# Patient Record
Sex: Male | Born: 1950 | Race: White | Hispanic: No | Marital: Single | Smoking: Current every day smoker
Health system: Southern US, Community
[De-identification: ages and names within clinical notes are randomized; demographics above are authoritative.]

---

## 2019-08-23 DIAGNOSIS — E860 Dehydration: Secondary | ICD-10-CM

## 2019-08-23 DIAGNOSIS — R197 Diarrhea, unspecified: Secondary | ICD-10-CM

## 2019-08-23 DIAGNOSIS — J189 Pneumonia, unspecified organism: Secondary | ICD-10-CM

## 2019-08-23 DIAGNOSIS — R531 Weakness: Secondary | ICD-10-CM

## 2019-08-23 DIAGNOSIS — E871 Hypo-osmolality and hyponatremia: Secondary | ICD-10-CM

## 2019-08-24 DIAGNOSIS — E871 Hypo-osmolality and hyponatremia: Secondary | ICD-10-CM | POA: Diagnosis not present

## 2019-08-24 DIAGNOSIS — E860 Dehydration: Secondary | ICD-10-CM | POA: Diagnosis not present

## 2019-08-24 DIAGNOSIS — I361 Nonrheumatic tricuspid (valve) insufficiency: Secondary | ICD-10-CM

## 2019-08-24 DIAGNOSIS — R531 Weakness: Secondary | ICD-10-CM | POA: Diagnosis not present

## 2019-08-24 DIAGNOSIS — R197 Diarrhea, unspecified: Secondary | ICD-10-CM | POA: Diagnosis not present

## 2019-08-25 DIAGNOSIS — R531 Weakness: Secondary | ICD-10-CM | POA: Diagnosis not present

## 2019-08-25 DIAGNOSIS — R197 Diarrhea, unspecified: Secondary | ICD-10-CM | POA: Diagnosis not present

## 2019-08-25 DIAGNOSIS — E860 Dehydration: Secondary | ICD-10-CM | POA: Diagnosis not present

## 2019-08-25 DIAGNOSIS — E871 Hypo-osmolality and hyponatremia: Secondary | ICD-10-CM | POA: Diagnosis not present

## 2019-08-28 ENCOUNTER — Encounter (HOSPITAL_COMMUNITY): Payer: Self-pay | Admitting: Internal Medicine

## 2019-08-28 ENCOUNTER — Inpatient Hospital Stay (HOSPITAL_COMMUNITY): Payer: Medicare Other

## 2019-08-28 ENCOUNTER — Inpatient Hospital Stay (HOSPITAL_COMMUNITY)
Admission: AD | Admit: 2019-08-28 | Discharge: 2019-09-09 | DRG: 871 | Disposition: A | Discharge status: Skilled Nursing Facility | Payer: Medicare Other | Source: Other Acute Inpatient Hospital | Attending: Internal Medicine | Admitting: Internal Medicine

## 2019-08-28 DIAGNOSIS — A4902 Methicillin resistant Staphylococcus aureus infection, unspecified site: Secondary | ICD-10-CM | POA: Diagnosis present

## 2019-08-28 DIAGNOSIS — I639 Cerebral infarction, unspecified: Secondary | ICD-10-CM | POA: Diagnosis present

## 2019-08-28 DIAGNOSIS — R7881 Bacteremia: Secondary | ICD-10-CM | POA: Diagnosis present

## 2019-08-28 DIAGNOSIS — A4102 Sepsis due to Methicillin resistant Staphylococcus aureus: Secondary | ICD-10-CM | POA: Diagnosis present

## 2019-08-28 DIAGNOSIS — F10239 Alcohol dependence with withdrawal, unspecified: Secondary | ICD-10-CM | POA: Diagnosis present

## 2019-08-28 DIAGNOSIS — G062 Extradural and subdural abscess, unspecified: Secondary | ICD-10-CM

## 2019-08-28 DIAGNOSIS — Z7189 Other specified counseling: Secondary | ICD-10-CM | POA: Diagnosis not present

## 2019-08-28 DIAGNOSIS — G9340 Encephalopathy, unspecified: Secondary | ICD-10-CM

## 2019-08-28 DIAGNOSIS — F1020 Alcohol dependence, uncomplicated: Secondary | ICD-10-CM | POA: Diagnosis present

## 2019-08-28 DIAGNOSIS — F17200 Nicotine dependence, unspecified, uncomplicated: Secondary | ICD-10-CM | POA: Diagnosis present

## 2019-08-28 DIAGNOSIS — E871 Hypo-osmolality and hyponatremia: Secondary | ICD-10-CM | POA: Diagnosis present

## 2019-08-28 DIAGNOSIS — E876 Hypokalemia: Secondary | ICD-10-CM | POA: Diagnosis present

## 2019-08-28 DIAGNOSIS — I2690 Septic pulmonary embolism without acute cor pulmonale: Secondary | ICD-10-CM

## 2019-08-28 DIAGNOSIS — R471 Dysarthria and anarthria: Secondary | ICD-10-CM | POA: Diagnosis not present

## 2019-08-28 DIAGNOSIS — B9562 Methicillin resistant Staphylococcus aureus infection as the cause of diseases classified elsewhere: Secondary | ICD-10-CM | POA: Diagnosis not present

## 2019-08-28 DIAGNOSIS — I10 Essential (primary) hypertension: Secondary | ICD-10-CM | POA: Diagnosis present

## 2019-08-28 DIAGNOSIS — I76 Septic arterial embolism: Secondary | ICD-10-CM | POA: Diagnosis present

## 2019-08-28 DIAGNOSIS — G061 Intraspinal abscess and granuloma: Secondary | ICD-10-CM | POA: Diagnosis not present

## 2019-08-28 DIAGNOSIS — R0600 Dyspnea, unspecified: Secondary | ICD-10-CM | POA: Diagnosis not present

## 2019-08-28 DIAGNOSIS — R6520 Severe sepsis without septic shock: Secondary | ICD-10-CM | POA: Diagnosis present

## 2019-08-28 DIAGNOSIS — R1313 Dysphagia, pharyngeal phase: Secondary | ICD-10-CM | POA: Diagnosis not present

## 2019-08-28 DIAGNOSIS — Z20828 Contact with and (suspected) exposure to other viral communicable diseases: Secondary | ICD-10-CM | POA: Diagnosis present

## 2019-08-28 DIAGNOSIS — D638 Anemia in other chronic diseases classified elsewhere: Secondary | ICD-10-CM | POA: Diagnosis present

## 2019-08-28 DIAGNOSIS — G92 Toxic encephalopathy: Secondary | ICD-10-CM | POA: Diagnosis present

## 2019-08-28 DIAGNOSIS — Y92239 Unspecified place in hospital as the place of occurrence of the external cause: Secondary | ICD-10-CM | POA: Diagnosis present

## 2019-08-28 DIAGNOSIS — D72810 Lymphocytopenia: Secondary | ICD-10-CM | POA: Diagnosis present

## 2019-08-28 DIAGNOSIS — Z8673 Personal history of transient ischemic attack (TIA), and cerebral infarction without residual deficits: Secondary | ICD-10-CM | POA: Diagnosis not present

## 2019-08-28 DIAGNOSIS — Z781 Physical restraint status: Secondary | ICD-10-CM

## 2019-08-28 DIAGNOSIS — F10230 Alcohol dependence with withdrawal, uncomplicated: Secondary | ICD-10-CM | POA: Diagnosis not present

## 2019-08-28 DIAGNOSIS — R0689 Other abnormalities of breathing: Secondary | ICD-10-CM | POA: Diagnosis not present

## 2019-08-28 DIAGNOSIS — I63441 Cerebral infarction due to embolism of right cerebellar artery: Secondary | ICD-10-CM | POA: Diagnosis present

## 2019-08-28 DIAGNOSIS — R0902 Hypoxemia: Secondary | ICD-10-CM

## 2019-08-28 DIAGNOSIS — F1910 Other psychoactive substance abuse, uncomplicated: Secondary | ICD-10-CM

## 2019-08-28 DIAGNOSIS — R29810 Facial weakness: Secondary | ICD-10-CM

## 2019-08-28 DIAGNOSIS — S301XXA Contusion of abdominal wall, initial encounter: Secondary | ICD-10-CM | POA: Diagnosis present

## 2019-08-28 DIAGNOSIS — Z66 Do not resuscitate: Secondary | ICD-10-CM | POA: Diagnosis not present

## 2019-08-28 DIAGNOSIS — F10939 Alcohol use, unspecified with withdrawal, unspecified: Secondary | ICD-10-CM | POA: Diagnosis present

## 2019-08-28 DIAGNOSIS — Z720 Tobacco use: Secondary | ICD-10-CM

## 2019-08-28 DIAGNOSIS — J15212 Pneumonia due to Methicillin resistant Staphylococcus aureus: Secondary | ICD-10-CM | POA: Diagnosis present

## 2019-08-28 DIAGNOSIS — R4182 Altered mental status, unspecified: Secondary | ICD-10-CM | POA: Diagnosis not present

## 2019-08-28 DIAGNOSIS — Z7289 Other problems related to lifestyle: Secondary | ICD-10-CM | POA: Diagnosis not present

## 2019-08-28 DIAGNOSIS — M4622 Osteomyelitis of vertebra, cervical region: Secondary | ICD-10-CM | POA: Diagnosis not present

## 2019-08-28 DIAGNOSIS — J90 Pleural effusion, not elsewhere classified: Secondary | ICD-10-CM | POA: Diagnosis not present

## 2019-08-28 DIAGNOSIS — W19XXXA Unspecified fall, initial encounter: Secondary | ICD-10-CM | POA: Diagnosis present

## 2019-08-28 DIAGNOSIS — R748 Abnormal levels of other serum enzymes: Secondary | ICD-10-CM | POA: Diagnosis present

## 2019-08-28 DIAGNOSIS — Z515 Encounter for palliative care: Secondary | ICD-10-CM

## 2019-08-28 LAB — BASIC METABOLIC PANEL
Anion gap: 9 (ref 5–15)
Anion gap: 11 (ref 5–15)
Anion gap: 11 (ref 5–15)
Anion gap: 10 (ref 5–15)
BUN: 9 mg/dL (ref 8–23)
BUN: 11 mg/dL (ref 8–23)
BUN: 11 mg/dL (ref 8–23)
BUN: 14 mg/dL (ref 8–23)
CO2: 27 mmol/L (ref 22–32)
CO2: 25 mmol/L (ref 22–32)
CO2: 27 mmol/L (ref 22–32)
CO2: 27 mmol/L (ref 22–32)
Calcium: 8.6 mg/dL — ABNORMAL LOW (ref 8.9–10.3)
Calcium: 8.8 mg/dL — ABNORMAL LOW (ref 8.9–10.3)
Calcium: 8.7 mg/dL — ABNORMAL LOW (ref 8.9–10.3)
Calcium: 8.6 mg/dL — ABNORMAL LOW (ref 8.9–10.3)
Chloride: 112 mmol/L — ABNORMAL HIGH (ref 98–111)
Chloride: 113 mmol/L — ABNORMAL HIGH (ref 98–111)
Chloride: 113 mmol/L — ABNORMAL HIGH (ref 98–111)
Chloride: 111 mmol/L (ref 98–111)
Creatinine, Ser: 0.56 mg/dL — ABNORMAL LOW (ref 0.61–1.24)
Creatinine, Ser: 0.72 mg/dL (ref 0.61–1.24)
Creatinine, Ser: 0.7 mg/dL (ref 0.61–1.24)
Creatinine, Ser: 0.83 mg/dL (ref 0.61–1.24)
GFR calc Af Amer: >60 mL/min (ref >60)
GFR calc Af Amer: >60 mL/min (ref >60)
GFR calc Af Amer: >60 mL/min (ref >60)
GFR calc Af Amer: >60 mL/min (ref >60)
GFR calc non Af Amer: >60 mL/min (ref >60)
GFR calc non Af Amer: >60 mL/min (ref >60)
GFR calc non Af Amer: >60 mL/min (ref >60)
GFR calc non Af Amer: >60 mL/min (ref >60)
Glucose, Bld: 136 mg/dL — ABNORMAL HIGH (ref 70–99)
Glucose, Bld: 121 mg/dL — ABNORMAL HIGH (ref 70–99)
Glucose, Bld: 117 mg/dL — ABNORMAL HIGH (ref 70–99)
Glucose, Bld: 121 mg/dL — ABNORMAL HIGH (ref 70–99)
Potassium: 3.4 mmol/L — ABNORMAL LOW (ref 3.5–5.1)
Potassium: 3.6 mmol/L (ref 3.5–5.1)
Potassium: 3.6 mmol/L (ref 3.5–5.1)
Potassium: 3.6 mmol/L (ref 3.5–5.1)
Sodium: 148 mmol/L — ABNORMAL HIGH (ref 135–145)
Sodium: 149 mmol/L — ABNORMAL HIGH (ref 135–145)
Sodium: 151 mmol/L — ABNORMAL HIGH (ref 135–145)
Sodium: 148 mmol/L — ABNORMAL HIGH (ref 135–145)

## 2019-08-28 LAB — TSH: TSH: 0.489 u[IU]/mL (ref 0.350–4.500)

## 2019-08-28 LAB — CREATININE, URINE, RANDOM: Creatinine, Urine: 68.9 mg/dL

## 2019-08-28 LAB — HEMOGLOBIN AND HEMATOCRIT, BLOOD
HCT: 38.8 % — ABNORMAL LOW (ref 39.0–52.0)
HCT: 41.7 % (ref 39.0–52.0)
HCT: 38.9 % — ABNORMAL LOW (ref 39.0–52.0)
Hemoglobin: 13.5 g/dL (ref 13.0–17.0)
Hemoglobin: 13.6 g/dL (ref 13.0–17.0)
Hemoglobin: 13.4 g/dL (ref 13.0–17.0)

## 2019-08-28 LAB — URINALYSIS, ROUTINE W REFLEX MICROSCOPIC
Bilirubin Urine: NEGATIVE
Glucose, UA: NEGATIVE mg/dL
Hgb urine dipstick: NEGATIVE
Ketones, ur: NEGATIVE mg/dL
Leukocytes,Ua: NEGATIVE
Nitrite: NEGATIVE
Protein, ur: NEGATIVE mg/dL
Specific Gravity, Urine: 1.013 (ref 1.005–1.030)
pH: 5 (ref 5.0–8.0)

## 2019-08-28 LAB — CBC WITH DIFFERENTIAL/PLATELET
Abs Immature Granulocytes: 0 10*3/uL (ref 0.00–0.07)
Basophils Absolute: 0 10*3/uL (ref 0.0–0.1)
Basophils Relative: 0 %
Eosinophils Absolute: 0.3 10*3/uL (ref 0.0–0.5)
Eosinophils Relative: 1 %
HCT: 42 % (ref 39.0–52.0)
Hemoglobin: 14.4 g/dL (ref 13.0–17.0)
Lymphocytes Relative: 3 %
Lymphs Abs: 0.9 10*3/uL (ref 0.7–4.0)
MCH: 31.9 pg (ref 26.0–34.0)
MCHC: 34.3 g/dL (ref 30.0–36.0)
MCV: 93.1 fL (ref 80.0–100.0)
Monocytes Absolute: 2 10*3/uL — ABNORMAL HIGH (ref 0.1–1.0)
Monocytes Relative: 7 %
Neutro Abs: 25.9 10*3/uL — ABNORMAL HIGH (ref 1.7–7.7)
Neutrophils Relative %: 89 %
Platelets: 191 10*3/uL (ref 150–400)
RBC: 4.51 MIL/uL (ref 4.22–5.81)
RDW: 13.2 % (ref 11.5–15.5)
WBC: 29.1 10*3/uL — ABNORMAL HIGH (ref 4.0–10.5)
nRBC: 0 % (ref 0.0–0.2)
nRBC: 0 /100 WBC

## 2019-08-28 LAB — HEPATIC FUNCTION PANEL
ALT: 23 U/L (ref 0–44)
AST: 32 U/L (ref 15–41)
Albumin: 2 g/dL — ABNORMAL LOW (ref 3.5–5.0)
Alkaline Phosphatase: 79 U/L (ref 38–126)
Bilirubin, Direct: 0.3 mg/dL — ABNORMAL HIGH (ref 0.0–0.2)
Indirect Bilirubin: 0.9 mg/dL (ref 0.3–0.9)
Total Bilirubin: 1.2 mg/dL (ref 0.3–1.2)
Total Protein: 5.6 g/dL — ABNORMAL LOW (ref 6.5–8.1)

## 2019-08-28 LAB — HIV ANTIBODY (ROUTINE TESTING W REFLEX): HIV Screen 4th Generation wRfx: NONREACTIVE

## 2019-08-28 LAB — LIPID PANEL
Cholesterol: 99 mg/dL (ref 0–200)
HDL: 19 mg/dL — ABNORMAL LOW (ref >40)
LDL Cholesterol: 67 mg/dL (ref 0–99)
Total CHOL/HDL Ratio: 5.2 RATIO
Triglycerides: 66 mg/dL (ref <150)
VLDL: 13 mg/dL (ref 0–40)

## 2019-08-28 LAB — GLUCOSE, CAPILLARY
Glucose-Capillary: 115 mg/dL — ABNORMAL HIGH (ref 70–99)
Glucose-Capillary: 101 mg/dL — ABNORMAL HIGH (ref 70–99)
Glucose-Capillary: 113 mg/dL — ABNORMAL HIGH (ref 70–99)
Glucose-Capillary: 111 mg/dL — ABNORMAL HIGH (ref 70–99)

## 2019-08-28 LAB — CBC
HCT: 45.3 % (ref 39.0–52.0)
Hemoglobin: 15.4 g/dL (ref 13.0–17.0)
MCH: 32.6 pg (ref 26.0–34.0)
MCHC: 34 g/dL (ref 30.0–36.0)
MCV: 96 fL (ref 80.0–100.0)
Platelets: 199 10*3/uL (ref 150–400)
RBC: 4.72 MIL/uL (ref 4.22–5.81)
RDW: 13.4 % (ref 11.5–15.5)
WBC: 31.7 10*3/uL — ABNORMAL HIGH (ref 4.0–10.5)
nRBC: 0 % (ref 0.0–0.2)

## 2019-08-28 LAB — HEMOGLOBIN A1C
Hgb A1c MFr Bld: 5.1 % (ref 4.8–5.6)
Mean Plasma Glucose: 99.67 mg/dL

## 2019-08-28 LAB — TROPONIN I (HIGH SENSITIVITY)
Troponin I (High Sensitivity): 21 ng/L — ABNORMAL HIGH (ref <18)
Troponin I (High Sensitivity): 18 ng/L — ABNORMAL HIGH (ref <18)

## 2019-08-28 LAB — CK: Total CK: 36 U/L — ABNORMAL LOW (ref 49–397)

## 2019-08-28 LAB — SEDIMENTATION RATE: Sed Rate: 19 mm/hr — ABNORMAL HIGH (ref 0–16)

## 2019-08-28 LAB — LACTIC ACID, PLASMA
Lactic Acid, Venous: 2.1 mmol/L (ref 0.5–1.9)
Lactic Acid, Venous: 2.2 mmol/L (ref 0.5–1.9)

## 2019-08-28 LAB — AMMONIA: Ammonia: 45 umol/L — ABNORMAL HIGH (ref 9–35)

## 2019-08-28 LAB — MAGNESIUM: Magnesium: 1.7 mg/dL (ref 1.7–2.4)

## 2019-08-28 LAB — SODIUM, URINE, RANDOM: Sodium, Ur: <10 mmol/L

## 2019-08-28 IMAGING — CT CT CHEST W/ CM
2 of 6 series · 12 of 46 positions shown, 14 images · IV contrast (omnipaque)
Comparison: Chest radiograph dated [DATE].

CLINICAL DATA: 68-year-old male with fever and leukocytosis.

EXAM:
CT CHEST, ABDOMEN, AND PELVIS WITH CONTRAST
TECHNIQUE: Multidetector CT imaging of the chest, abdomen and pelvis was
performed following the standard protocol during bolus
administration of intravenous contrast.
CONTRAST:  100mL OMNIPAQUE IOHEXOL 300 MG/ML  SOLN

[Series 3: abd/ pelvis 5.0 i30f 2 · axial · 0.79mm/px · z∈[+957,+1557]mm · 9 of 138 slices shown, 11 images]
[im 9/138  soft-tissue]
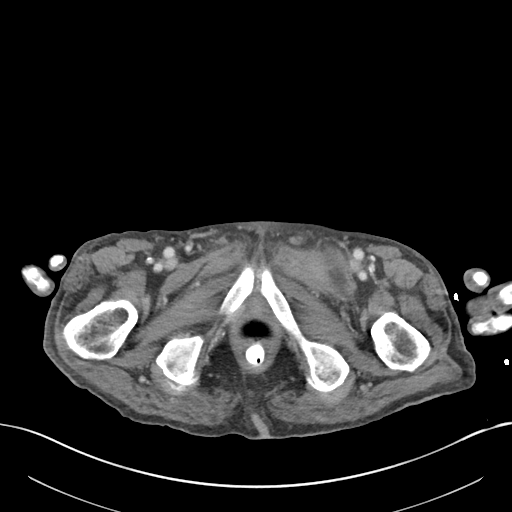
[im 9/138  bone]
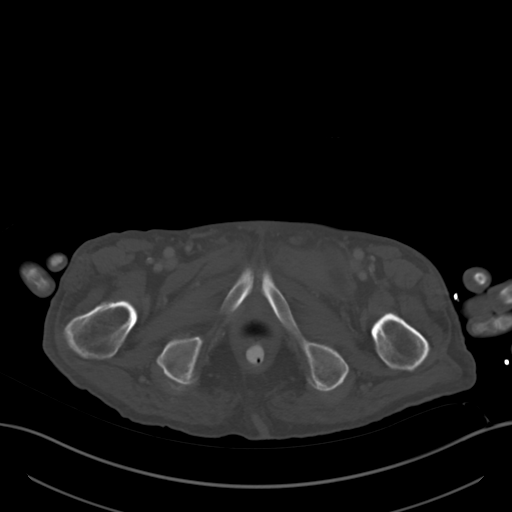
[im 26/138  soft-tissue]
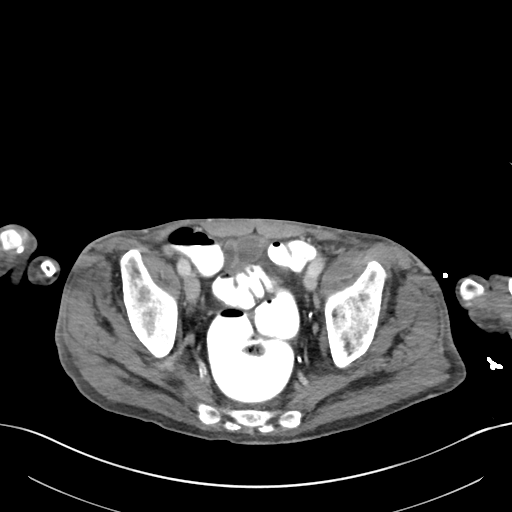
[im 43/138  soft-tissue]
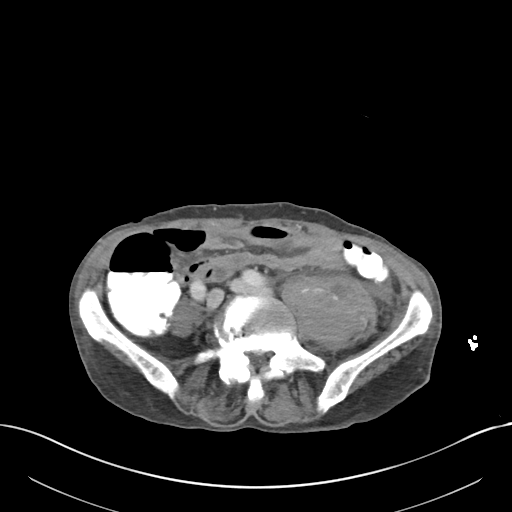
[im 52/138  soft-tissue]
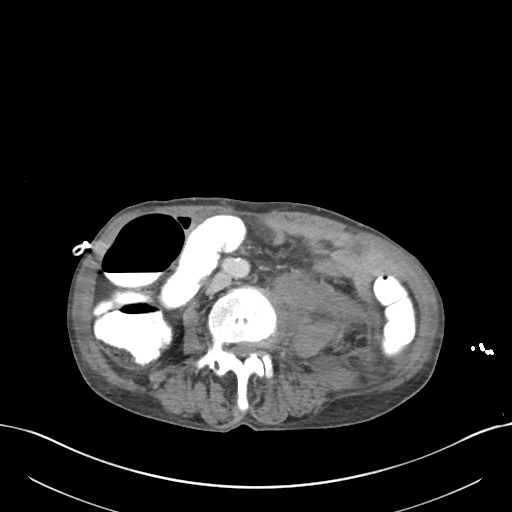
[im 69/138  soft-tissue]
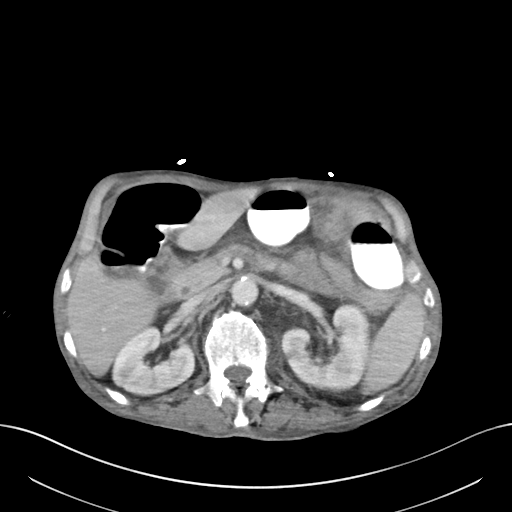
[im 86/138  soft-tissue]
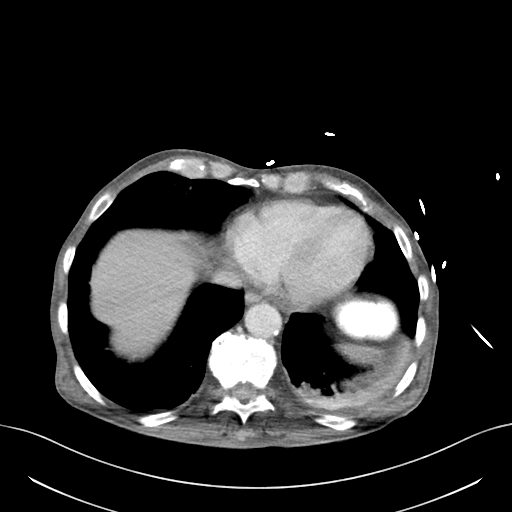
[im 95/138  soft-tissue]
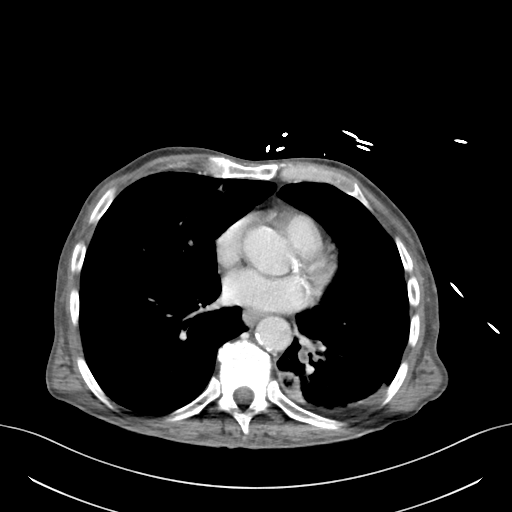
[im 112/138  soft-tissue]
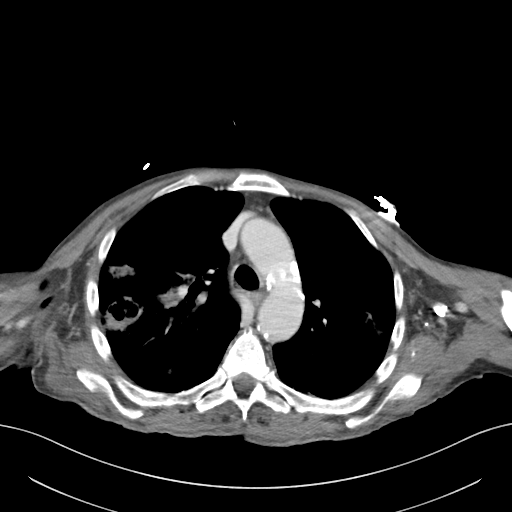
[im 129/138  soft-tissue]
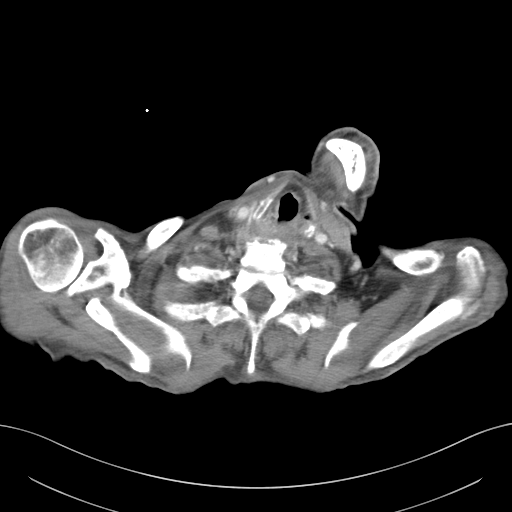
[im 129/138  bone]
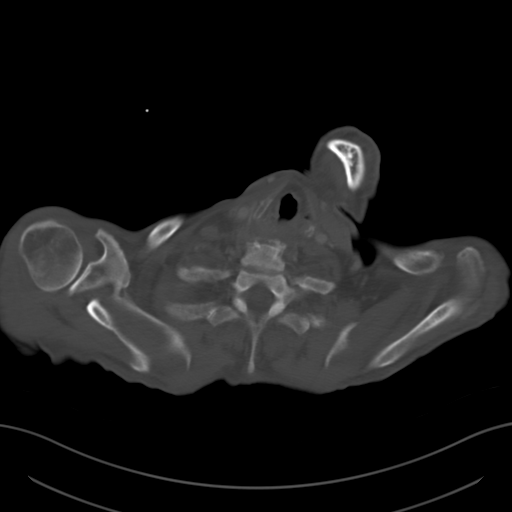

[Series 6: coronal soft tissue · coronal · 0.69mm/px · 3 of 82 slices shown]
[im 28/82  soft-tissue]
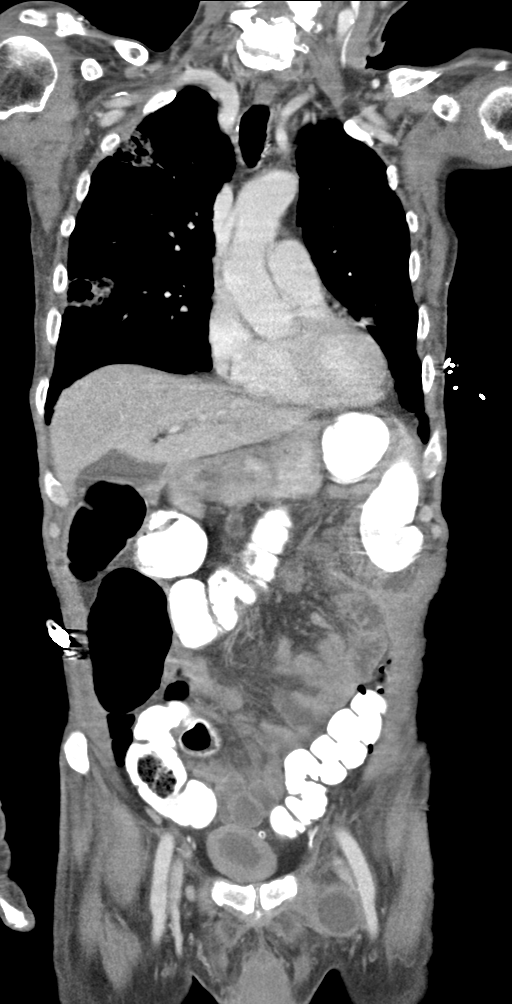
[im 37/82  soft-tissue]
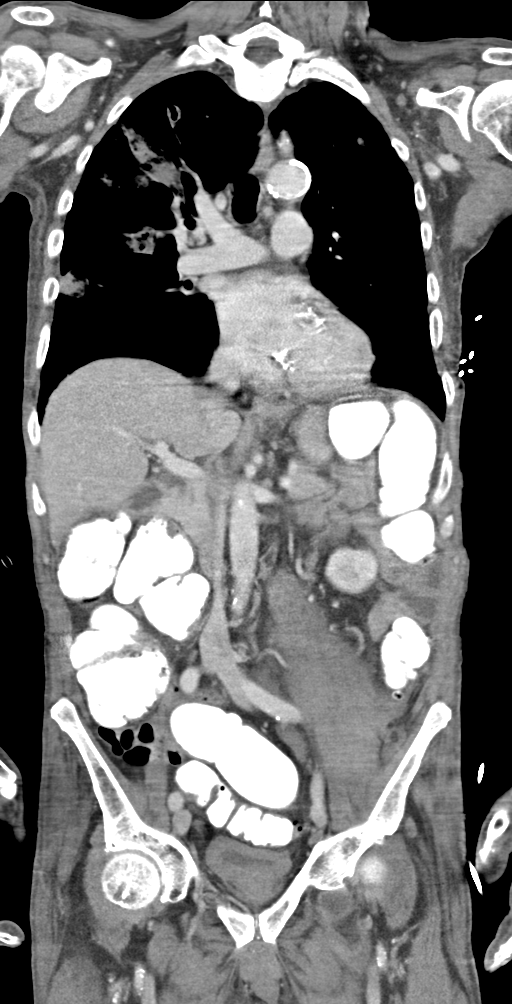
[im 46/82  soft-tissue]
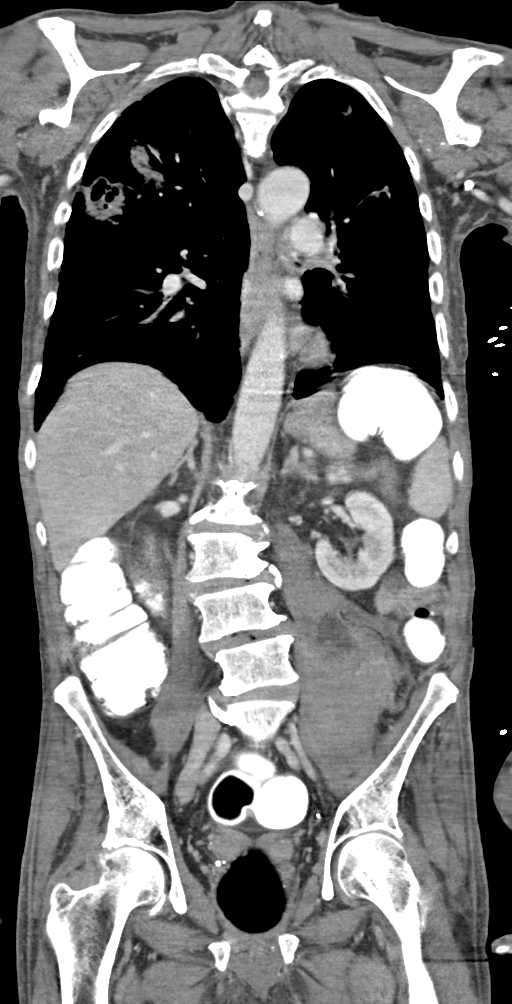

[12 of 46 positions shown; findings below may reference images not displayed]

FINDINGS: CT CHEST FINDINGS

Cardiovascular: There is no cardiomegaly or pericardial effusion.
Calcification of the mitral annulus noted. There is mild
atherosclerotic calcification of the thoracic aorta. No aneurysmal
dilatation or dissection. The central pulmonary arteries are
unremarkable as visualized.

Mediastinum/Nodes: No hilar or mediastinal adenopathy. The esophagus
and the thyroid gland are grossly unremarkable. No mediastinal fluid
collection.

Lungs/Pleura: Trace left pleural effusion. Scattered clusters of
ground-glass and nodular opacities with central cavitation primarily
involving the upper lobes noted. A patchy area of airspace opacity
is noted in the right upper lobe with areas of cavitation. Findings
may represent fungal infection, abscesses, TB, or septic emboli.
Other etiologies are not excluded. Clinical correlation is
recommended. There is no pneumothorax. Mucus content noted in the
distal trachea extending into the left mainstem bronchus. The
central airways however remain patent.

Musculoskeletal: No chest wall mass or suspicious bone lesions
identified.

CT ABDOMEN PELVIS FINDINGS

No intra-abdominal free air or free fluid.

Hepatobiliary: The liver is unremarkable as visualized. No
intrahepatic biliary ductal dilatation. The gallbladder is
unremarkable.

Pancreas: Unremarkable. No pancreatic ductal dilatation or
surrounding inflammatory changes.

Spleen: Normal in size without focal abnormality.

Adrenals/Urinary Tract: The adrenal glands are unremarkable. There
is no hydronephrosis on either side. There is symmetric enhancement
and excretion of contrast by both kidneys. Slightly striated
enhancement pattern of the renal parenchyma may be artifactual.
Correlation with urinalysis recommended to exclude pyelonephritis. A
subcentimeter left renal hypodense lesion is too small to
characterize. The visualized ureters appear unremarkable. The
urinary bladder is predominantly collapsed. There is apparent
diffuse thickening of the bladder wall which may be partly related
to underdistention. Cystitis is not excluded. Correlation with
urinalysis recommended.

Stomach/Bowel: A rectal tube and contrast noted throughout the
colon. There is no bowel obstruction or active inflammation. There
are scattered sigmoid diverticula without active inflammation. The
appendix is normal.

Vascular/Lymphatic: Mild aortoiliac atherosclerotic disease. The IVC
is unremarkable. No portal venous gas. There is no adenopathy.

Reproductive: The prostate and seminal vesicles are grossly
unremarkable.

Other: There is intramuscular hematoma involving the left psoas
muscle extending to the left iliacus muscle. The hematoma in the
left psoas muscle measures approximately 6.5 x 5.3 cm in greatest
axial dimensions and 10 cm in craniocaudal length. There is linear
contrast blushing within the left psoas muscle suggestive of active
bleed.

There is a 3.7 x 3.5 cm low attenuating/cystic structure in the
region of the left inguinal canal which may represent a seroma or an
old hematoma, or possibly a lymphocele. A necrotic mass or lymph
node is favored less likely but not excluded. Correlation with
clinical exam and follow-up recommended. Ultrasound may provide
better evaluation of this structure.

Musculoskeletal: Degenerative changes of the spine. No acute osseous
pathology.
IMPRESSION: 1. Left psoas intramuscular hematoma with evidence of small active
bleed.
2. No bowel obstruction or active inflammation. Normal appendix.
3. Sigmoid diverticulosis.
4. Bilateral pulmonary densities with pattern concerning for fungal
infection, abscesses, TB, or septic emboli. Other etiologies are not
excluded. Clinical correlation is recommended. Trace left pleural
effusion.
5. Slight heterogeneous nephrogram may be artifactual or represent
pyelonephritis. Correlation with urinalysis recommended.
6. Aortic Atherosclerosis ([P1]-[P1]).

These results were called by telephone at the time of interpretation
on [DATE] at [DATE] to provider LORENCI , who verbally
acknowledged these results.

## 2019-08-28 IMAGING — CT CT ABD-PELV W/ CM
2 of 6 series · 12 of 46 positions shown, 14 images · IV contrast (APPLIED)
Comparison: Chest radiograph dated [DATE].

CLINICAL DATA: 68-year-old male with fever and leukocytosis.

EXAM:
CT CHEST, ABDOMEN, AND PELVIS WITH CONTRAST
TECHNIQUE: Multidetector CT imaging of the chest, abdomen and pelvis was
performed following the standard protocol during bolus
administration of intravenous contrast.
CONTRAST:  100mL OMNIPAQUE IOHEXOL 300 MG/ML  SOLN

[Series 3: abd/ pelvis 5.0 i30f 2 · axial · 0.79mm/px · z∈[+957,+1557]mm · 9 of 138 slices shown, 11 images]
[im 9/138  soft-tissue]
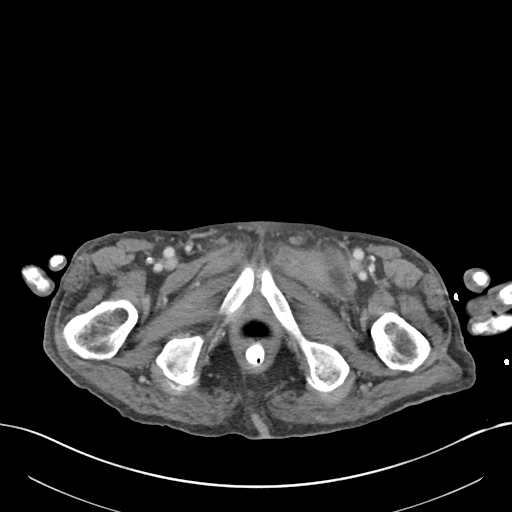
[im 9/138  bone]
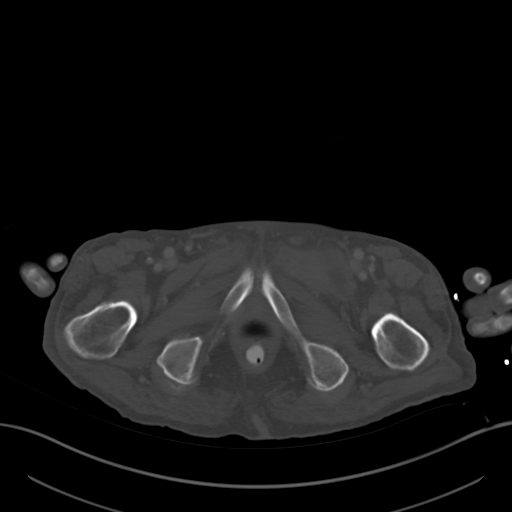
[im 26/138  soft-tissue]
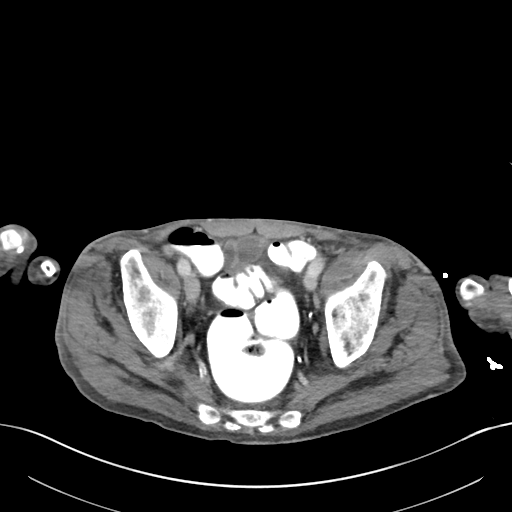
[im 43/138  soft-tissue]
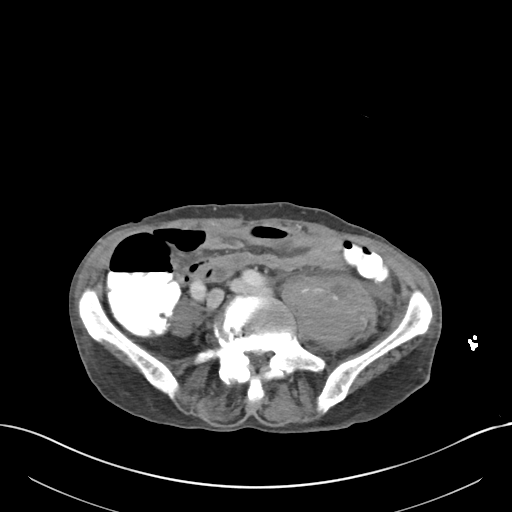
[im 52/138  soft-tissue]
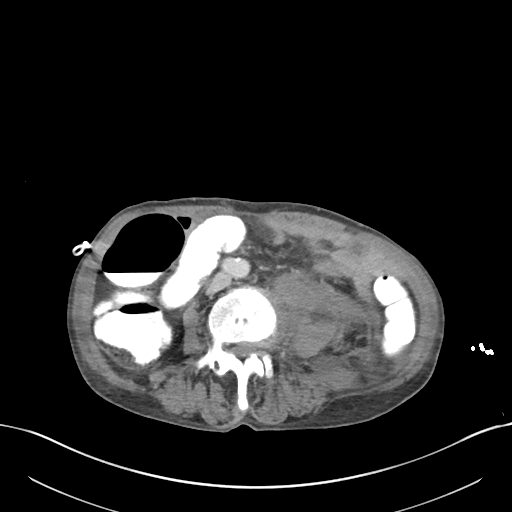
[im 69/138  soft-tissue]
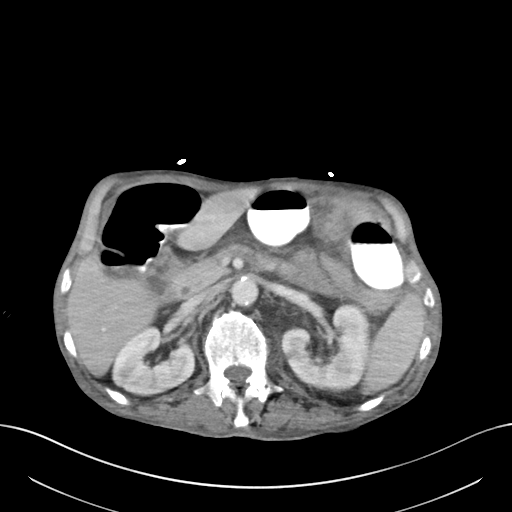
[im 86/138  soft-tissue]
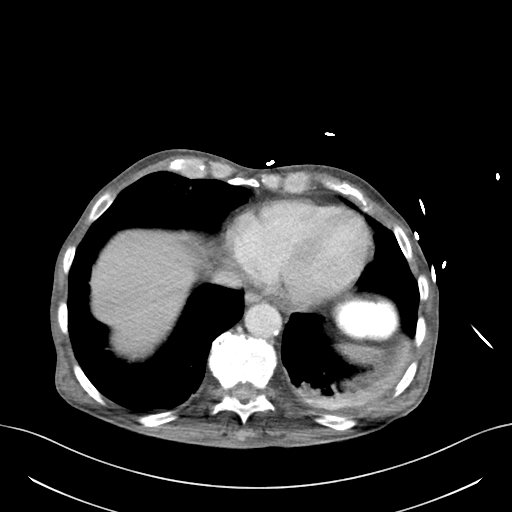
[im 95/138  soft-tissue]
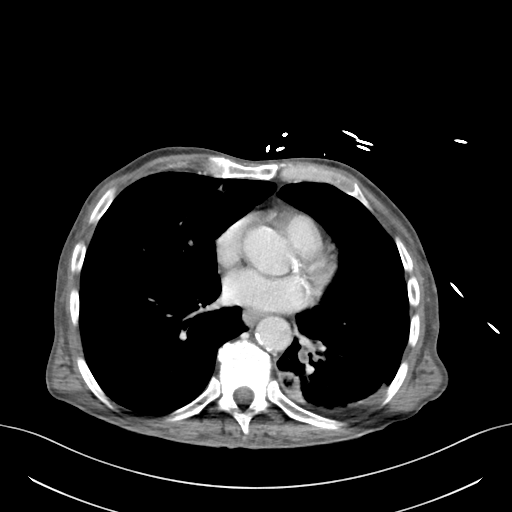
[im 112/138  soft-tissue]
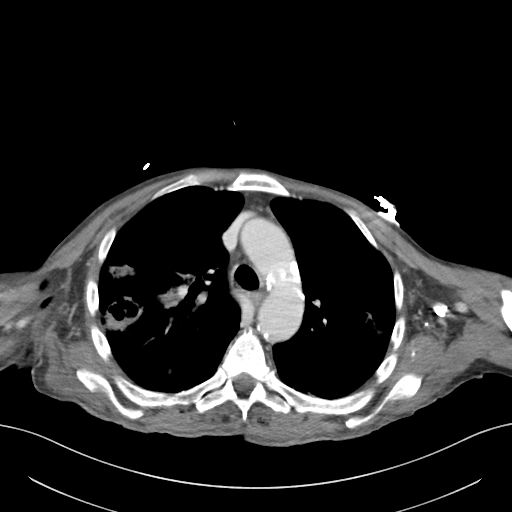
[im 129/138  soft-tissue]
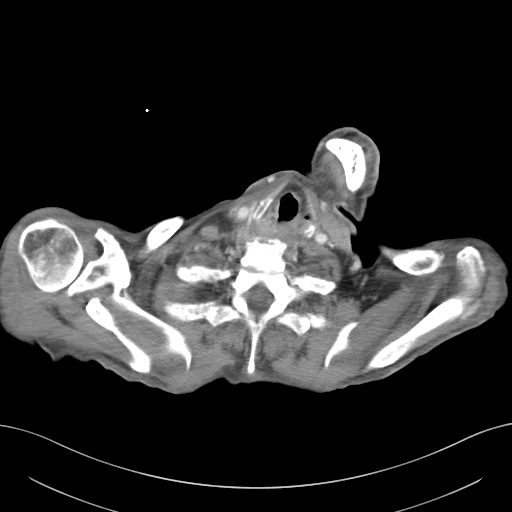
[im 129/138  bone]
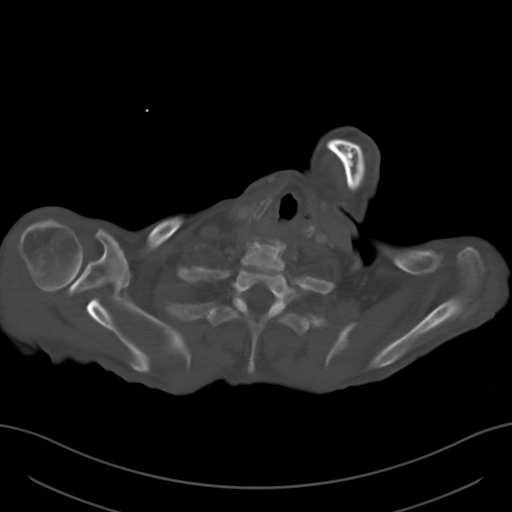

[Series 6: coronal soft tissue · coronal · 0.69mm/px · 3 of 82 slices shown]
[im 28/82  soft-tissue]
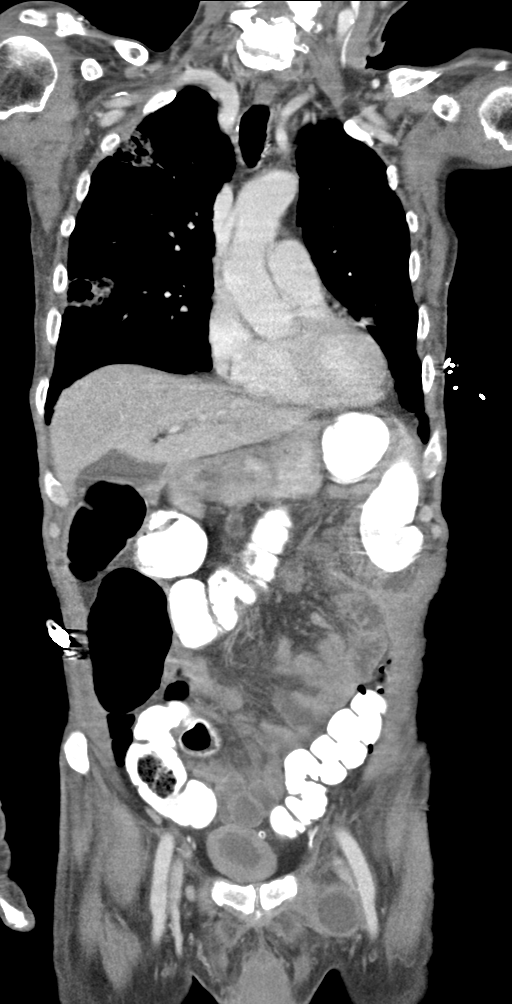
[im 37/82  soft-tissue]
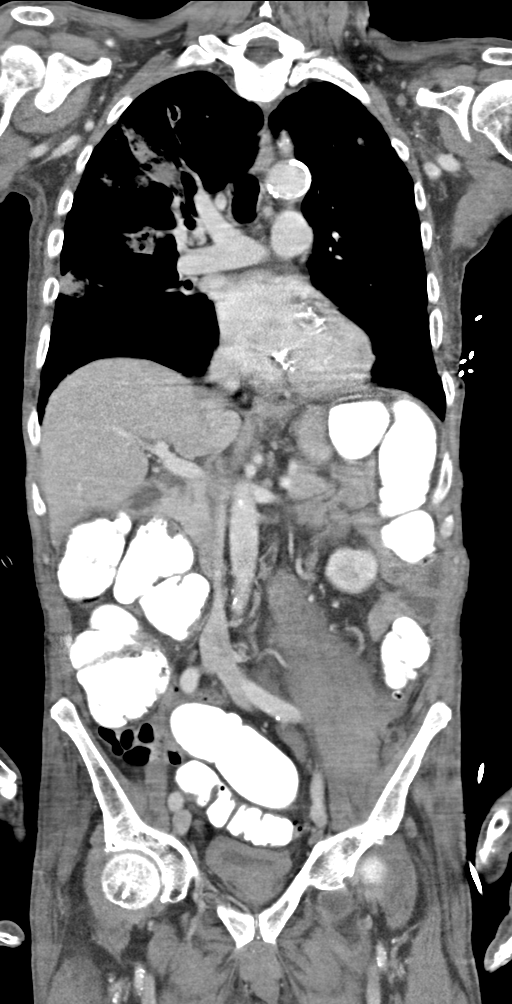
[im 46/82  soft-tissue]
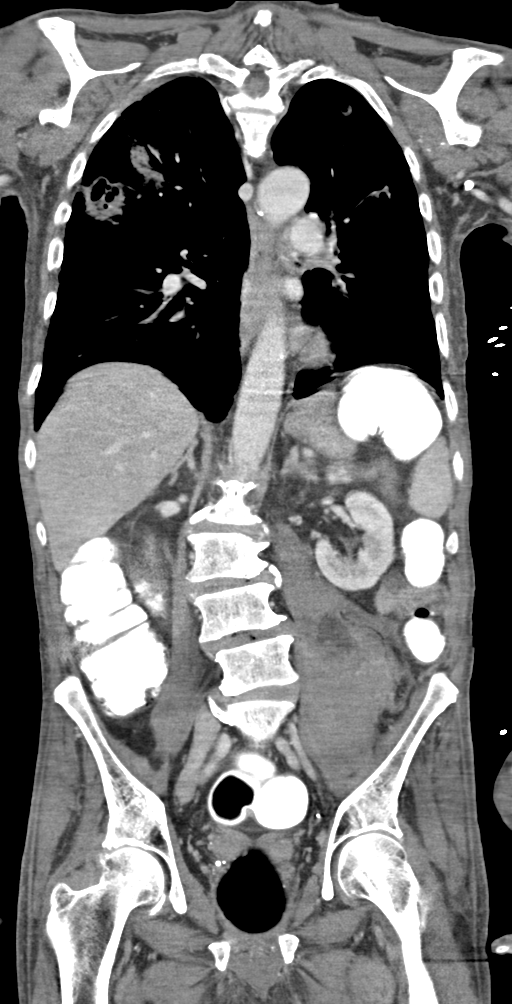

[12 of 46 positions shown; findings below may reference images not displayed]

FINDINGS: CT CHEST FINDINGS

Cardiovascular: There is no cardiomegaly or pericardial effusion.
Calcification of the mitral annulus noted. There is mild
atherosclerotic calcification of the thoracic aorta. No aneurysmal
dilatation or dissection. The central pulmonary arteries are
unremarkable as visualized.

Mediastinum/Nodes: No hilar or mediastinal adenopathy. The esophagus
and the thyroid gland are grossly unremarkable. No mediastinal fluid
collection.

Lungs/Pleura: Trace left pleural effusion. Scattered clusters of
ground-glass and nodular opacities with central cavitation primarily
involving the upper lobes noted. A patchy area of airspace opacity
is noted in the right upper lobe with areas of cavitation. Findings
may represent fungal infection, abscesses, TB, or septic emboli.
Other etiologies are not excluded. Clinical correlation is
recommended. There is no pneumothorax. Mucus content noted in the
distal trachea extending into the left mainstem bronchus. The
central airways however remain patent.

Musculoskeletal: No chest wall mass or suspicious bone lesions
identified.

CT ABDOMEN PELVIS FINDINGS

No intra-abdominal free air or free fluid.

Hepatobiliary: The liver is unremarkable as visualized. No
intrahepatic biliary ductal dilatation. The gallbladder is
unremarkable.

Pancreas: Unremarkable. No pancreatic ductal dilatation or
surrounding inflammatory changes.

Spleen: Normal in size without focal abnormality.

Adrenals/Urinary Tract: The adrenal glands are unremarkable. There
is no hydronephrosis on either side. There is symmetric enhancement
and excretion of contrast by both kidneys. Slightly striated
enhancement pattern of the renal parenchyma may be artifactual.
Correlation with urinalysis recommended to exclude pyelonephritis. A
subcentimeter left renal hypodense lesion is too small to
characterize. The visualized ureters appear unremarkable. The
urinary bladder is predominantly collapsed. There is apparent
diffuse thickening of the bladder wall which may be partly related
to underdistention. Cystitis is not excluded. Correlation with
urinalysis recommended.

Stomach/Bowel: A rectal tube and contrast noted throughout the
colon. There is no bowel obstruction or active inflammation. There
are scattered sigmoid diverticula without active inflammation. The
appendix is normal.

Vascular/Lymphatic: Mild aortoiliac atherosclerotic disease. The IVC
is unremarkable. No portal venous gas. There is no adenopathy.

Reproductive: The prostate and seminal vesicles are grossly
unremarkable.

Other: There is intramuscular hematoma involving the left psoas
muscle extending to the left iliacus muscle. The hematoma in the
left psoas muscle measures approximately 6.5 x 5.3 cm in greatest
axial dimensions and 10 cm in craniocaudal length. There is linear
contrast blushing within the left psoas muscle suggestive of active
bleed.

There is a 3.7 x 3.5 cm low attenuating/cystic structure in the
region of the left inguinal canal which may represent a seroma or an
old hematoma, or possibly a lymphocele. A necrotic mass or lymph
node is favored less likely but not excluded. Correlation with
clinical exam and follow-up recommended. Ultrasound may provide
better evaluation of this structure.

Musculoskeletal: Degenerative changes of the spine. No acute osseous
pathology.
IMPRESSION: 1. Left psoas intramuscular hematoma with evidence of small active
bleed.
2. No bowel obstruction or active inflammation. Normal appendix.
3. Sigmoid diverticulosis.
4. Bilateral pulmonary densities with pattern concerning for fungal
infection, abscesses, TB, or septic emboli. Other etiologies are not
excluded. Clinical correlation is recommended. Trace left pleural
effusion.
5. Slight heterogeneous nephrogram may be artifactual or represent
pyelonephritis. Correlation with urinalysis recommended.
6. Aortic Atherosclerosis ([P1]-[P1]).

These results were called by telephone at the time of interpretation
on [DATE] at [DATE] to provider LORENCI , who verbally
acknowledged these results.

## 2019-08-28 MED ORDER — FOLIC ACID 1 MG PO TABS
1.0000 mg | ORAL_TABLET | Freq: Every day | ORAL | Status: DC
  Filled 2019-08-28: qty 1

## 2019-08-28 MED ORDER — VANCOMYCIN HCL 10 G IV SOLR
1250.0000 mg | Freq: Once | INTRAVENOUS | Status: DC
  Filled 2019-08-28: qty 1250

## 2019-08-28 MED ORDER — STROKE: EARLY STAGES OF RECOVERY BOOK
Freq: Once | Status: AC
  Administered 2019-08-28: 06:00:00
  Filled 2019-08-28: qty 1

## 2019-08-28 MED ORDER — LORAZEPAM 2 MG/ML IJ SOLN
0.0000 mg | Freq: Two times a day (BID) | INTRAMUSCULAR | Status: AC
  Administered 2019-08-30 – 2019-08-31 (×2): 1 mg via INTRAVENOUS
  Filled 2019-08-28 (×2): qty 1

## 2019-08-28 MED ORDER — ACETAMINOPHEN 650 MG RE SUPP
650.0000 mg | Freq: Once | RECTAL | Status: AC
  Administered 2019-08-28: 650 mg via RECTAL

## 2019-08-28 MED ORDER — ACETAMINOPHEN 325 MG PO TABS
650.0000 mg | ORAL_TABLET | ORAL | Status: DC | PRN
  Administered 2019-09-03 – 2019-09-05 (×3): 650 mg via ORAL
  Filled 2019-08-28 (×5): qty 2

## 2019-08-28 MED ORDER — ACETAMINOPHEN 500 MG PO TABS: 1000.0000 mg | ORAL_TABLET | Freq: Once | ORAL | Status: DC

## 2019-08-28 MED ORDER — ASPIRIN 325 MG PO TABS
325.0000 mg | ORAL_TABLET | Freq: Every day | ORAL | Status: DC
  Filled 2019-08-28: qty 1

## 2019-08-28 MED ORDER — ENOXAPARIN SODIUM 40 MG/0.4ML ~~LOC~~ SOLN
40.0000 mg | SUBCUTANEOUS | Status: DC
  Administered 2019-08-28: 40 mg via SUBCUTANEOUS
  Filled 2019-08-28: qty 0.4

## 2019-08-28 MED ORDER — THIAMINE HCL 100 MG/ML IJ SOLN
100.0000 mg | Freq: Every day | INTRAMUSCULAR | Status: DC
  Administered 2019-08-28 – 2019-09-09 (×10): 100 mg via INTRAVENOUS
  Filled 2019-08-28 (×11): qty 2

## 2019-08-28 MED ORDER — IOHEXOL 300 MG/ML  SOLN
100.0000 mL | Freq: Once | INTRAMUSCULAR | Status: AC | PRN
  Administered 2019-08-28: 100 mL via INTRAVENOUS

## 2019-08-28 MED ORDER — ASPIRIN 300 MG RE SUPP
300.0000 mg | Freq: Every day | RECTAL | Status: DC
  Administered 2019-08-28: 300 mg via RECTAL
  Filled 2019-08-28: qty 1

## 2019-08-28 MED ORDER — LORAZEPAM 2 MG/ML IJ SOLN
1.0000 mg | INTRAMUSCULAR | Status: AC | PRN
  Administered 2019-08-31: 2 mg via INTRAVENOUS
  Filled 2019-08-28: qty 1

## 2019-08-28 MED ORDER — VITAMIN B-1 100 MG PO TABS
100.0000 mg | ORAL_TABLET | Freq: Every day | ORAL | Status: DC
  Administered 2019-09-01 – 2019-09-05 (×2): 100 mg via ORAL
  Filled 2019-08-28 (×6): qty 1

## 2019-08-28 MED ORDER — ACETAMINOPHEN 160 MG/5ML PO SOLN
650.0000 mg | ORAL | Status: DC | PRN
  Administered 2019-08-30 – 2019-08-31 (×2): 650 mg
  Filled 2019-08-28 (×2): qty 20.3

## 2019-08-28 MED ORDER — KCL IN DEXTROSE-NACL 10-5-0.45 MEQ/L-%-% IV SOLN
INTRAVENOUS | Status: DC
  Administered 2019-08-28 – 2019-09-03 (×12): via INTRAVENOUS
  Filled 2019-08-28 (×16): qty 1000

## 2019-08-28 MED ORDER — ADULT MULTIVITAMIN W/MINERALS CH
1.0000 | ORAL_TABLET | Freq: Every day | ORAL | Status: DC
  Filled 2019-08-28: qty 1

## 2019-08-28 MED ORDER — VANCOMYCIN HCL IN DEXTROSE 1-5 GM/200ML-% IV SOLN
1000.0000 mg | Freq: Two times a day (BID) | INTRAVENOUS | Status: DC
  Administered 2019-08-28 – 2019-09-05 (×16): 1000 mg via INTRAVENOUS
  Filled 2019-08-28 (×16): qty 200

## 2019-08-28 MED ORDER — ACETAMINOPHEN 650 MG RE SUPP
650.0000 mg | RECTAL | Status: DC | PRN
  Administered 2019-08-28 – 2019-09-02 (×2): 650 mg via RECTAL
  Filled 2019-08-28 (×4): qty 1

## 2019-08-28 MED ORDER — LORAZEPAM 2 MG/ML IJ SOLN
0.0000 mg | Freq: Four times a day (QID) | INTRAMUSCULAR | Status: AC
  Administered 2019-08-28: 1 mg via INTRAVENOUS
  Administered 2019-08-29 (×2): 2 mg via INTRAVENOUS
  Filled 2019-08-28 (×3): qty 1

## 2019-08-28 MED ORDER — LORAZEPAM 1 MG PO TABS: 1.0000 mg | ORAL_TABLET | ORAL | Status: AC | PRN

## 2019-08-28 MED ORDER — SODIUM CHLORIDE 0.9 % IV SOLN
INTRAVENOUS | Status: DC
  Administered 2019-08-28: 03:00:00 via INTRAVENOUS

## 2019-08-28 NOTE — Progress Notes (Signed)
EEG complete - results pending 

## 2019-08-28 NOTE — Progress Notes (Signed)
Pt transferred to and from CT tests. HR remains in 140s, SBP in 150s. O2 is on 4 LNC.  Nurse is aware

## 2019-08-28 NOTE — H&P (Addendum)
History and Physical    Ryan ApleyRichard Lane UJW:[id number] DOB: 1951-04-18 DOA: 08/28/2019  PCP: Patient, No Pcp Per  Patient coming from: [facility].  All the history was obtained from transferring physician through the discharge summary.  Patient is confused.  Chief Complaint: MRSA bacteremia per the discharge summary.  HPI: Ryan Lane is a 68 y.o. male with history of alcohol and tobacco abuse was [facility] on August 23, 2019 that is about 5 days ago with fever chills body aches diarrhea decreased appetite.  In the ER he was found to have leukocytosis and bandemia with lymphopenia.  Patient's lab work showed hyponatremia of 115 lactic acidosis of 3 procalcitonin of 4.95 COVID-19 test was negative chest x-ray showed right middle lobe opacity concerning for pneumonia.  He was started on ceftriaxone and azithromycin for community-acquired pneumonia.  Subsequent which blood cultures grew MRSA.  [facility] and antibiotics were changed to vancomycin and had 2D echo which did not show anything acute and an infectious disease consultant requested patient will need TEE.  The following day patient had a fall following which patient has become more confused and also had some garbled speech.  Plan was to get a CT head and MRI brain but since patient was not lying flat steadily it was initially unable to be done.  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  Patient was found to be mildly weak on the right side with garbled speech.  Getting more confused.  Since patient is also alcoholic was placed on CIWA protocol along with thiamine.  Per records patient's EEG was unremarkable.  Patient was placed on fluids initially for hyponatremia which did not improve with fluids.  Records show that patient was planned to be  started on tolvaptan.  Per the report patient's last sodium was 116 WBC count around 14,000 creatinine was normal.  [facility] management of MRSA bacteremia requiring TEE and stroke.  On my exam patient is completely confused not oriented to name also.  But moves all extremities.  Patient is tachycardic.  Afebrile.  ED Course: Patient is a direct admit.  Review of Systems: As per HPI, rest all negative.   History reviewed. No pertinent past medical history.  History reviewed. No pertinent surgical history.   reports that he has been smoking. He has never used smokeless tobacco. No history on file for alcohol and drug.  Not on File  Family History  Family history unknown: Yes    Prior to Admission medications   Not on File    Physical Exam: Constitutional: Moderately built and nourished. Vitals:   08/28/19 0150 08/28/19 0330  BP: (!) 150/92 (!) 165/90  Pulse: (!) 125 (!) 115  Resp: 20 20  Temp: 99.9 F (37.7 C) 98.6 F (37 C)  TempSrc: Oral Oral  SpO2: 95% 95%  Weight: 61.8 kg    Eyes: Anicteric no pallor. ENMT: No discharge from the ears eyes nose or mouth. Neck: No mass or.  No neck rigidity. Respiratory: No rhonchi or crepitations. Cardiovascular: S1-S2 heard. Abdomen: Soft mild diffuse tenderness bowel sounds present. Musculoskeletal: No edema. Skin: No rash. Neurologic: Patient is alert awake but confused not oriented to his name moving all extremities.  Pupils are equal reactive to light. Psychiatric: Confused.   Labs on Admission: I have  personally reviewed following labs and imaging studies  CBC: Recent Labs  Lab 08/28/19 0245  WBC 29.1*  NEUTROABS 25.9*  HGB 14.4  HCT 42.0  MCV 93.1  PLT 756   Basic Metabolic Panel: Recent Labs  Lab 08/28/19 0245  NA 148*  K 3.4*  CL 112*  CO2 27  GLUCOSE 136*  BUN 9  CREATININE 0.56*  CALCIUM 8.6*  MG 1.7   GFR: CrCl cannot be calculated (Unknown  ideal weight.). Liver Function Tests: Recent Labs  Lab 08/28/19 0245  AST 32  ALT 23  ALKPHOS 79  BILITOT 1.2  PROT 5.6*  ALBUMIN 2.0*   No results for input(s): LIPASE, AMYLASE in the last 168 hours. No results for input(s): AMMONIA in the last 168 hours. Coagulation Profile: No results for input(s): INR, PROTIME in the last 168 hours. Cardiac Enzymes: No results for input(s): CKTOTAL, CKMB, CKMBINDEX, TROPONINI in the last 168 hours. BNP (last 3 results) No results for input(s): PROBNP in the last 8760 hours. HbA1C: Recent Labs    08/28/19 0245  HGBA1C 5.1   CBG: Recent Labs  Lab 08/28/19 0321  GLUCAP 115*   Lipid Profile: Recent Labs    08/28/19 0245  CHOL 99  HDL 19*  LDLCALC 67  TRIG 66  CHOLHDL 5.2   Thyroid Function Tests: No results for input(s): TSH, T4TOTAL, FREET4, T3FREE, THYROIDAB in the last 72 hours. Anemia Panel: No results for input(s): VITAMINB12, FOLATE, FERRITIN, TIBC, IRON, RETICCTPCT in the last 72 hours. Urine analysis: No results found for: COLORURINE, APPEARANCEUR, LABSPEC, PHURINE, GLUCOSEU, HGBUR, BILIRUBINUR, KETONESUR, PROTEINUR, UROBILINOGEN, NITRITE, LEUKOCYTESUR Sepsis Labs: @LABRCNTIP (procalcitonin:4,lacticidven:4) )No results found for this or any previous visit (from the past 240 hour(s)).   Radiological Exams on Admission: No results found.    Assessment/Plan Principal Problem:   MRSA bacteremia Active Problems:   Alcohol withdrawal ([place])   Acute CVA (cerebrovascular accident) ([place])   Hyponatremia    1. MRSA bacteremia source could be from pneumonia for which patient is on vancomycin at this time.  Will need to get cardiology involved for TEE.  Consult infectious disease in the morning. 2. Acute CVA -patient is presently confused and failed swallow.  Will get speech therapy involved.  Aspirin for now.  Discussed with Dr. Leonel Ramsay will be seeing patient in consult. 3. Acute encephalopathy likely multifactorial  including sepsis possible alcohol withdrawal acute CVA and electrolyte abnormalities.  Per report EEG was negative for any seizures.  Check ammonia levels.  Closely monitor.  Will also await neurology input. 4. Alcohol withdrawal on CIWA.   5. Hyponatremia which did not respond to fluids.  Urine sodium as per the report was around 11.  Per discharge summary patient was to be started on tolvaptan.  Not sure if he had received it.  The sodium done over ER shows 148.  And will repeat a stat to make sure it is not read.  Per report patient's cortisol levels were high will recheck TSH.  Check urine studies.  Given that patient has acute CVA with MRSA bacteremia acute encephalopathy patient is critically ill and will need inpatient status.  EKG repeat metabolic panel lactic acid TSH cardiac markers CK levels and urine studies are pending.  On my exam patient had mild tenderness of the abdomen and since patient is still tachycardic with leukocytosis I have ordered CT of the chest and abdomen without contrast stat.   DVT prophylaxis: Lovenox. Code Status: Full code. Family Communication: We will need to discuss  with family. Disposition Plan: To be determined. Consults called: Neurology. Admission status: Inpatient.   Eduard Clos MD [facility] Pager [phone].  If 7PM-7AM, please contact night-coverage [email or web address] Password [email or web address]  08/28/2019, 4:49 AM

## 2019-08-28 NOTE — Consult Note (Signed)
[facility]    Date of Admission:  08/28/2019     Total days of antibiotics                Reason for Consult: MRSA Bacteremia    Referring Provider: [facility] Primary Care Provider: Patient, No Pcp Per   ASSESSMENT:  Mr. 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 Blood cultures have shown persistent bacteremia and will obtain new cultures today. There is concern for alcohol withdrawal although per OSH notes he has not drank alcohol recently. Will continue current dose of vancomycin and recommend TEE to be completed. Primary team discussing options with family. At minimum he will need at least 6 weeks of IV antibiotic therapy.    PLAN:  1. Continue vancomycin 2. Monitor renal function for nephrotoxicity 3. Recheck blood cultures. 4. TEE recommended when able depending on plan of care.    Principal Problem:   MRSA bacteremia Active Problems:   Alcohol withdrawal (H[facility])   Acute CVA (cerebrovascular accident) (H[facility])   Hyponatremia   . folic acid  1 mg Oral Daily  . LORazepam  0-4 mg Intravenous Q6H   Followed by  . [START ON 08/30/2019] LORazepam  0-4 mg Intravenous Q12H  . multivitamin with minerals  1 tablet Oral Daily  . thiamine  100 mg Oral Daily   Or  . thiamine  100 mg Intravenous Daily     HPI: Eladio Dentremont is a 67 y.o. male with previous medical history of alcohol and tobacco use who was initially seen at [facility] emergency department with a chief complaint of body aches, fatigue, diarrhea, and decreased appetite for 2-day duration.  He was having loose watery diarrhea for 2 days and low-grade fever.  In the ED he was febrile with a temperature of 101.3 and tachycardic. Blood work showed hyponatremia with sodium level of 115 and WBC count of 12.5. Lactic acid elevated at 3.0. hest x-ray  obtained with hazy consolidative opacities in the right midlung which may represent pneumonia in the appropriate clinical setting.SARS-CoV-2 testing negative. Blood cultures obtained and started on ceftriaxone and azithromycin with concern for pneumonia.   Blood cultures turned positive for MRSA. ID consulted with recommendation change to vancomycin. Repeat blood cultures on 08/24/19 and 09/05/19 were positive.   On 11/8 he experienced a fall moving between the chair and the bed with no neurological conditions. He was noted to have garbled speech and weakness on the right side over the course of the following day. STAT MRI and and CT was attempted but unsuccessful. Teleneurology evaluated with AMS and encephalopathy and stroke findings were not affecting his current status. EEG was negative.   TTE was negative for vegetations with recommendations for TEE. No probe was available at [facility] so he was transferred to [facility].    Mr. Brooks has been febrile since admission with a max temperature of 101.1 and significant leukocytosis of 31.7. Neurology evaluation with suspicion for persistent septic encephalopathy vs meningitis and concern for embolic disease. CT of the abdomen and pelvis/chest with left psoas intramuscular hematoma with small active bleed; ground glass nodular opacities with central cavitation  concerning for fungal infection, abscesses or septic emboli.   Mr. Haddon continues to have altered mental status and does not able to significantly contribute to this information which has been obtained primarily from OSH  records and general chart review. Per primary notes Mr. Liew has history of methamphetamine, THC, and other illicit drugs. HIV non-reactive.   Review of Systems: Review of Systems  Unable to perform ROS: Acuity of condition     History reviewed. No pertinent past medical history.  Social History   Tobacco Use  . Smoking status: Current Every Day Smoker  .  Smokeless tobacco: Never Used  Substance Use Topics  . Alcohol use: Not on file  . Drug use: Not on file    Family History  Family history unknown: Yes    Not on File  OBJECTIVE: Blood pressure 136/74, pulse (!) 135, temperature 99.1 F (37.3 C), resp. rate (!) 25, weight 61.8 kg, SpO2 98 %.  Physical Exam Constitutional:      General: He is not in acute distress.    Appearance: He is well-developed. He is ill-appearing.     Comments: Lying in bed  Cardiovascular:     Rate and Rhythm: Normal rate and regular rhythm.     Heart sounds: Normal heart sounds. No murmur.  Pulmonary:     Effort: Pulmonary effort is normal.     Breath sounds: Normal breath sounds.  Skin:    General: Skin is warm and dry.  Neurological:     Mental Status: He is alert.     Comments: Apparent facial droop present; able to move all 4 extremities and follows commands. Speech is garbled at best he appears to be oriented to person.     Lab Results Lab Results  Component Value Date   WBC 31.7 (H) 08/28/2019   HGB 13.5 08/28/2019   HCT 38.8 (L) 08/28/2019   MCV 96.0 08/28/2019   PLT 199 08/28/2019    Lab Results  Component Value Date   CREATININE 0.83 08/28/2019   BUN 14 08/28/2019   NA 148 (H) 08/28/2019   K 3.6 08/28/2019   CL 111 08/28/2019   CO2 27 08/28/2019    Lab Results  Component Value Date   ALT 23 08/28/2019   AST 32 08/28/2019   ALKPHOS 79 08/28/2019   BILITOT 1.2 08/28/2019     Microbiology: No results found for this or any previous visit (from the past 240 hour(s)).   Terri Piedra, NP [facility] [phone] Pager  08/28/2019  4:05 PM

## 2019-08-28 NOTE — Progress Notes (Signed)
PT Cancellation Note  Patient Details Name: Ryan Lane MRN: [id number] DOB: [birth year]   Cancelled Treatment:    Reason Eval/Treat Not Completed: Patient not medically ready Per RN, [place], pt not appropriate for therapy evaluation due to lethargy and fever.  Ellamae Sia, PT, DPT Acute Rehabilitation Services Pager [phone] Office [phone]    Willy Eddy 08/28/2019, 12:01 PM

## 2019-08-28 NOTE — Progress Notes (Addendum)
Pharmacy Antibiotic Note  Ryan Lane is a 68 y.o. male admitted on 08/28/2019 with bacteremia.  Pharmacy has been consulted for Vancomycin dosing.  CrCL approximately 75 ml/min.  Vancomycin 1000 mg IV Q 12 hrs. Goal AUC 400-550. Expected AUC: 531 SCr used: 0.7   Plan: Vanc 1000 mg IV q12hr Monitor renal function, clinical status, C&S and vanc levels  Weight: 136 lb 3.9 oz (61.8 kg)  Temp (24hrs), Avg:99.3 F (37.4 C), Min:98.6 F (37 C), Max:99.9 F (37.7 C)  Recent Labs  Lab 08/28/19 0245  WBC 29.1*  CREATININE 0.56*    CrCl cannot be calculated (Unknown ideal weight.).    Not on File  Antimicrobials this admission: Vanc 11/12 >>  Thank you for allowing pharmacy to be a part of this patient's care.  Alanda Slim, PharmD, [facility] Clinical Pharmacist Please see AMION for all Pharmacists' Contact Phone Numbers 08/28/2019, 4:06 AM

## 2019-08-28 NOTE — Progress Notes (Signed)
Lab called RN with critical lab value for lactic acid of 2.1. Dr. Hal Hope notified; no new orders received. Will continue to closely monitor pt. Delia Heady RN

## 2019-08-28 NOTE — Progress Notes (Signed)
Pt has gone for other testing will try back later.Marland KitchenMarland Kitchen

## 2019-08-28 NOTE — Progress Notes (Signed)
Patient Demographics:    Ryan Lane, is a 68 y.o. male, DOB - Aug 17, 1951, YJE:[id number]  Admit date - 08/28/2019   Admitting Physician Darliss Cheney, MD  Outpatient Primary MD for the patient is Patient, No Pcp Per  LOS - 0   No chief complaint on file.       Subjective:    Ryan Lane today has  no emesis,  No chest pain, persistent fevers -Altered mentation  Assessment  & Plan :    Principal Problem:   MRSA bacteremia Active Problems:   Alcohol withdrawal ([place])   Acute CVA (cerebrovascular accident) ([place])   Hyponatremia  Brief Summary:- 68 y.o. male with a history of alcohol abuse who was brought to [facility] on November 7 and found to have pneumonia/bacteremia with MRSA, transferred to [facility] on 08/28/2019 for further treatment and possible TEE EEG and TTE at [facility] were largely unremarkable   CT Chest/CT Abd/Pelvis 1. Left psoas intramuscular hematoma with evidence of small active bleed. 2. No bowel obstruction or active inflammation. Normal appendix. 3. Sigmoid diverticulosis. 4. Bilateral pulmonary densities with pattern concerning for fungal infection, abscesses, TB, or septic emboli. Other etiologies are not excluded. Clinical correlation is recommended. Trace left pleural effusion. 5. Slight heterogeneous nephrogram may be artifactual or represent pyelonephritis. Correlation with urinalysis recommended.   A/p 1) Left psoas intramuscular hematoma with evidence of small active bleed--- stop subcu Lovenox, stop aspirin, -H&H every 4 hours -Hemoglobin currently down to 13.5 from 15.4 -Discussed with radiologist no specific intervention required at this time  2) acute toxic/metabolic encephalopathy--- in the setting of MRSA bacteremia and pneumonia--- treat underlying infection with IV vancomycin,  -Repeat EEG pending -Continue thiamine replacement, low  index of suspicion for Warnicke's encephalopathy (history of longstanding alcohol abuse) -Ammonia is 45, and TSH is 0.489  3)Cerebellar CVA--MRI from [facility] with acute stroke (punctate cerebellar infarct/ right cerebellar and right corona radiata )----- may be embolic, however unable to give aspirin due to #1 above -Consider repeat MRI if neuro status does not improve with treatment of underlying infection  4)Sepsis secondary to MRSA Bacteremia and pneumonia--- WBC 31.7, persistent fevers (T-max 101.2), tachycardia with heart rate in the 130s to 140s and tachypnea with respiratory rate in the 20s to 30s, and lactic acid of 2.2--continue IV vancomycin, repeat blood cultures ordered  5)Polysubstance Abuse--- as per patient's nephew Mr. Rush Landmark Essick--patient has history of methamphetamine, THC alcohol and other illicit drug use  6)Social/Ethics--patient is estranged from his 2 sons over the years due to polysubstance abuse, patient sister Izora Gala and patient's nephew Karlene Einstein his primary contact - I called and d/w nephew Karlene Einstein [phone] -Get palliative care consult  Disposition/Need for in-Hospital Stay- patient unable to be discharged at this time due to --- sepsis secondary to MRSA bacteremia and pneumonia requiring IV antibiotics as well as acute stroke requiring further monitoring  Code Status : Full code  Family Communication:     d/w nephew Karlene Einstein -[phone]  Disposition Plan  : TBD  Consults  :  Neurology/ID/Radiology  DVT Prophylaxis  :  - SCDs (active psoas of bleeding)  Lab Results  Component Value Date   PLT 199 08/28/2019    Inpatient Medications  Scheduled  Meds:  folic acid  1 mg Oral Daily   LORazepam  0-4 mg Intravenous Q6H   Followed by   Melene Muller ON 08/30/2019] LORazepam  0-4 mg Intravenous Q12H   multivitamin with minerals  1 tablet Oral Daily   thiamine  100 mg Oral Daily   Or   thiamine  100 mg Intravenous Daily   Continuous  Infusions:  vancomycin     PRN Meds:.acetaminophen **OR** acetaminophen (TYLENOL) oral liquid 160 mg/5 mL **OR** acetaminophen, LORazepam **OR** LORazepam    Anti-infectives (From admission, onward)   Start     Dose/Rate Route Frequency Ordered Stop   08/28/19 1600  vancomycin (VANCOCIN) IVPB 1000 mg/200 mL premix     1,000 mg 200 mL/hr over 60 Minutes Intravenous Every 12 hours 08/28/19 0409     08/28/19 0315  vancomycin (VANCOCIN) 1,250 mg in sodium chloride 0.9 % 250 mL IVPB  Status:  Discontinued     1,250 mg 166.7 mL/hr over 90 Minutes Intravenous  Once 08/28/19 0307 08/28/19 0549        Objective:   Vitals:   08/28/19 1030 08/28/19 1153 08/28/19 1200 08/28/19 1400  BP:   (!) 153/91 136/74  Pulse: (!) 140  (!) 147 (!) 135  Resp: (!) 38  (!) 25 (!) 25  Temp: (!) 100.6 F (38.1 C) (!) 101.1 F (38.4 C)  99.1 F (37.3 C)  TempSrc: Axillary Axillary    SpO2: 93%  92% 98%  Weight:        Wt Readings from Last 3 Encounters:  08/28/19 61.8 kg     Intake/Output Summary (Last 24 hours) at 08/28/2019 1516 Last data filed at 08/28/2019 1229 Gross per 24 hour  Intake --  Output 600 ml  Net -600 ml   Physical Exam  Gen:-Very lethargic HEENT:- [place].AT, No sclera icterus Neck-Supple Neck,No JVD,.  Lungs-diminished breath sounds with scattered rhonchi CV- S1, S2 normal, tachycardic with heart rate in the 130s  abd-  +ve B.Sounds, Abd Soft, No tenderness,    Extremity/Skin:- No  edema, pedal pulses present  Psych-inconsistently responsive, very lethargic Neuro-generalized weakness, some left-sided deficits, neuro exam is limited due to severe lethargy and inability to consistently follow commands     Data Review:   Micro Results No results found for this or any previous visit (from the past 240 hour(s)).  Radiology Reports Ct Chest W Contrast  Result Date: 08/28/2019 CLINICAL DATA:  68 year old male with fever and leukocytosis. EXAM: CT CHEST, ABDOMEN, AND PELVIS  WITH CONTRAST TECHNIQUE: Multidetector CT imaging of the chest, abdomen and pelvis was performed following the standard protocol during bolus administration of intravenous contrast. CONTRAST:  OMNIPAQUE IOHEXOL 300 MG/ML  SOLN COMPARISON:  Chest radiograph dated 08/22/2019. FINDINGS: CT CHEST FINDINGS Cardiovascular: There is no cardiomegaly or pericardial effusion. Calcification of the mitral annulus noted. There is mild atherosclerotic calcification of the thoracic aorta. No aneurysmal dilatation or dissection. The central pulmonary arteries are unremarkable as visualized. Mediastinum/Nodes: No hilar or mediastinal adenopathy. The esophagus and the thyroid gland are grossly unremarkable. No mediastinal fluid collection. Lungs/Pleura: Trace left pleural effusion. Scattered clusters of ground-glass and nodular opacities with central cavitation primarily involving the upper lobes noted. A patchy area of airspace opacity is noted in the right upper lobe with areas of cavitation. Findings may represent fungal infection, abscesses, TB, or septic emboli. Other etiologies are not excluded. Clinical correlation is recommended. There is no pneumothorax. Mucus content noted in the distal trachea extending into the left  mainstem bronchus. The central airways however remain patent. Musculoskeletal: No chest wall mass or suspicious bone lesions identified. CT ABDOMEN PELVIS FINDINGS No intra-abdominal free air or free fluid. Hepatobiliary: The liver is unremarkable as visualized. No intrahepatic biliary ductal dilatation. The gallbladder is unremarkable. Pancreas: Unremarkable. No pancreatic ductal dilatation or surrounding inflammatory changes. Spleen: Normal in size without focal abnormality. Adrenals/Urinary Tract: The adrenal glands are unremarkable. There is no hydronephrosis on either side. There is symmetric enhancement and excretion of contrast by both kidneys. Slightly striated enhancement pattern of the renal  parenchyma may be artifactual. Correlation with urinalysis recommended to exclude pyelonephritis. A subcentimeter left renal hypodense lesion is too small to characterize. The visualized ureters appear unremarkable. The urinary bladder is predominantly collapsed. There is apparent diffuse thickening of the bladder wall which may be partly related to underdistention. Cystitis is not excluded. Correlation with urinalysis recommended. Stomach/Bowel: A rectal tube and contrast noted throughout the colon. There is no bowel obstruction or active inflammation. There are scattered sigmoid diverticula without active inflammation. The appendix is normal. Vascular/Lymphatic: Mild aortoiliac atherosclerotic disease. The IVC is unremarkable. No portal venous gas. There is no adenopathy. Reproductive: The prostate and seminal vesicles are grossly unremarkable. Other: There is intramuscular hematoma involving the left psoas muscle extending to the left iliacus muscle. The hematoma in the left psoas muscle measures approximately 6.5 x 5.3 cm in greatest axial dimensions and 10 cm in craniocaudal length. There is linear contrast blushing within the left psoas muscle suggestive of active bleed. There is a 3.7 x 3.5 cm low attenuating/cystic structure in the region of the left inguinal canal which may represent a seroma or an old hematoma, or possibly a lymphocele. A necrotic mass or lymph node is favored less likely but not excluded. Correlation with clinical exam and follow-up recommended. Ultrasound may provide better evaluation of this structure. Musculoskeletal: Degenerative changes of the spine. No acute osseous pathology. IMPRESSION: 1. Left psoas intramuscular hematoma with evidence of small active bleed. 2. No bowel obstruction or active inflammation. Normal appendix. 3. Sigmoid diverticulosis. 4. Bilateral pulmonary densities with pattern concerning for fungal infection, abscesses, TB, or septic emboli. Other etiologies are  not excluded. Clinical correlation is recommended. Trace left pleural effusion. 5. Slight heterogeneous nephrogram may be artifactual or represent pyelonephritis. Correlation with urinalysis recommended. 6. Aortic Atherosclerosis (ICD10-I70.0). These results were called by telephone at the time of interpretation on 08/28/2019 at 1:45 pm to provider [facility] , who verbally acknowledged these results. Electronically Signed   By: Elgie CollardArash  Radparvar M.D.   On: 08/28/2019 13:51   Ct Abdomen Pelvis W Contrast  Result Date: 08/28/2019 CLINICAL DATA:  68 year old male with fever and leukocytosis. EXAM: CT CHEST, ABDOMEN, AND PELVIS WITH CONTRAST TECHNIQUE: Multidetector CT imaging of the chest, abdomen and pelvis was performed following the standard protocol during bolus administration of intravenous contrast. CONTRAST:  100mL OMNIPAQUE IOHEXOL 300 MG/ML  SOLN COMPARISON:  Chest radiograph dated 08/22/2019. FINDINGS: CT CHEST FINDINGS Cardiovascular: There is no cardiomegaly or pericardial effusion. Calcification of the mitral annulus noted. There is mild atherosclerotic calcification of the thoracic aorta. No aneurysmal dilatation or dissection. The central pulmonary arteries are unremarkable as visualized. Mediastinum/Nodes: No hilar or mediastinal adenopathy. The esophagus and the thyroid gland are grossly unremarkable. No mediastinal fluid collection. Lungs/Pleura: Trace left pleural effusion. Scattered clusters of ground-glass and nodular opacities with central cavitation primarily involving the upper lobes noted. A patchy area of airspace opacity is noted in the right upper lobe with  areas of cavitation. Findings may represent fungal infection, abscesses, TB, or septic emboli. Other etiologies are not excluded. Clinical correlation is recommended. There is no pneumothorax. Mucus content noted in the distal trachea extending into the left mainstem bronchus. The central airways however remain patent.  Musculoskeletal: No chest wall mass or suspicious bone lesions identified. CT ABDOMEN PELVIS FINDINGS No intra-abdominal free air or free fluid. Hepatobiliary: The liver is unremarkable as visualized. No intrahepatic biliary ductal dilatation. The gallbladder is unremarkable. Pancreas: Unremarkable. No pancreatic ductal dilatation or surrounding inflammatory changes. Spleen: Normal in size without focal abnormality. Adrenals/Urinary Tract: The adrenal glands are unremarkable. There is no hydronephrosis on either side. There is symmetric enhancement and excretion of contrast by both kidneys. Slightly striated enhancement pattern of the renal parenchyma may be artifactual. Correlation with urinalysis recommended to exclude pyelonephritis. A subcentimeter left renal hypodense lesion is too small to characterize. The visualized ureters appear unremarkable. The urinary bladder is predominantly collapsed. There is apparent diffuse thickening of the bladder wall which may be partly related to underdistention. Cystitis is not excluded. Correlation with urinalysis recommended. Stomach/Bowel: A rectal tube and contrast noted throughout the colon. There is no bowel obstruction or active inflammation. There are scattered sigmoid diverticula without active inflammation. The appendix is normal. Vascular/Lymphatic: Mild aortoiliac atherosclerotic disease. The IVC is unremarkable. No portal venous gas. There is no adenopathy. Reproductive: The prostate and seminal vesicles are grossly unremarkable. Other: There is intramuscular hematoma involving the left psoas muscle extending to the left iliacus muscle. The hematoma in the left psoas muscle measures approximately 6.5 x 5.3 cm in greatest axial dimensions and 10 cm in craniocaudal length. There is linear contrast blushing within the left psoas muscle suggestive of active bleed. There is a 3.7 x 3.5 cm low attenuating/cystic structure in the region of the left inguinal canal which  may represent a seroma or an old hematoma, or possibly a lymphocele. A necrotic mass or lymph node is favored less likely but not excluded. Correlation with clinical exam and follow-up recommended. Ultrasound may provide better evaluation of this structure. Musculoskeletal: Degenerative changes of the spine. No acute osseous pathology. IMPRESSION: 1. Left psoas intramuscular hematoma with evidence of small active bleed. 2. No bowel obstruction or active inflammation. Normal appendix. 3. Sigmoid diverticulosis. 4. Bilateral pulmonary densities with pattern concerning for fungal infection, abscesses, TB, or septic emboli. Other etiologies are not excluded. Clinical correlation is recommended. Trace left pleural effusion. 5. Slight heterogeneous nephrogram may be artifactual or represent pyelonephritis. Correlation with urinalysis recommended. 6. Aortic Atherosclerosis (ICD10-I70.0). These results were called by telephone at the time of interpretation on 08/28/2019 at 1:45 pm to provider [facility] , who verbally acknowledged these results. Electronically Signed   By: Elgie Collard M.D.   On: 08/28/2019 13:51     CBC Recent Labs  Lab 08/28/19 0245 08/28/19 0627 08/28/19 1403  WBC 29.1* 31.7*  --   HGB 14.4 15.4 13.5  HCT 42.0 45.3 38.8*  PLT 191 199  --   MCV 93.1 96.0  --   MCH 31.9 32.6  --   MCHC 34.3 34.0  --   RDW 13.2 13.4  --   LYMPHSABS 0.9  --   --   MONOABS 2.0*  --   --   EOSABS 0.3  --   --   BASOSABS 0.0  --   --     Chemistries  Recent Labs  Lab 08/28/19 0245 08/28/19 0510 08/28/19 0952 08/28/19 1403  NA  148* 149* 151* 148*  K 3.4* 3.6 3.6 3.6  CL 112* 113* 113* 111  CO2 27 25 27 27   GLUCOSE 136* 121* 117* 121*  BUN 9 11 11 14   CREATININE 0.56* 0.72 0.70 0.83  CALCIUM 8.6* 8.8* 8.7* 8.6*  MG 1.7  --   --   --   AST 32  --   --   --   ALT 23  --   --   --   ALKPHOS 79  --   --   --   BILITOT 1.2  --   --   --     ------------------------------------------------------------------------------------------------------------------ Recent Labs    08/28/19 0245  CHOL 99  HDL 19*  LDLCALC 67  TRIG 66  CHOLHDL 5.2    Lab Results  Component Value Date   HGBA1C 5.1 08/28/2019   ------------------------------------------------------------------------------------------------------------------ Recent Labs    08/28/19 0510  TSH 0.489   ------------------------------------------------------------------------------------------------------------------ No results for input(s): VITAMINB12, FOLATE, FERRITIN, TIBC, IRON, RETICCTPCT in the last 72 hours.  Coagulation profile No results for input(s): INR, PROTIME in the last 168 hours.  No results for input(s): DDIMER in the last 72 hours.  Cardiac Enzymes No results for input(s): CKMB, TROPONINI, MYOGLOBIN in the last 168 hours.  Invalid input(s): CK ------------------------------------------------------------------------------------------------------------------ No results found for: BNP   13/09/2019 M.D on 08/28/2019 at 3:16 PM  Go to [email or web address] - for contact info  [facility] - Office  [phone]

## 2019-08-28 NOTE — Progress Notes (Signed)
SLP Cancellation Note  Patient Details Name: Ryan Lane MRN: [id number] DOB: [birth year]   Cancelled treatment:        Pt is cannot sustain alertness. Fever 101. Will see at next available time.    Wynelle Bourgeois, MA CCC-SLP 08/28/2019, 12:01 PM

## 2019-08-28 NOTE — Consult Note (Signed)
Neurology Consultation Reason for Consult: Altered mental status Referring Physician: Hal Hope, A  CC: Altered mental status  History is obtained from: Chart review  HPI: Ryan Lane is a 68 y.o. male with a history of alcohol abuse who was brought to [facility] on November 7 and found to have pneumonia/bacteremia with MRSA.  He was initially started on ceftriaxone/azithromycin but this was changed to vancomycin once cultures revealed MRSA.    LKW: Unclear, but prior to November 7 tpa given?: no, unclear time of onset    ROS:  Unable to obtain due to altered mental status.   Past medical history: Unable to obtain due to altered mental status.   Family History  Family history unknown: Yes  Unable to obtain due to AMS   Social History: Unable to obtain due to altered mental status.  Exam: Current vital signs: BP (!) 158/95 (BP Location: Right Arm)   Pulse (!) 113   Temp 99.4 F (37.4 C) (Oral)   Resp 20   Wt 61.8 kg   SpO2 96%  Vital signs in last 24 hours: Temp:  [98.6 F (37 C)-99.9 F (37.7 C)] 99.4 F (37.4 C) (11/12 0454) Pulse Rate:  [113-125] 113 (11/12 0454) Resp:  [20] 20 (11/12 0454) BP: (150-165)/(90-95) 158/95 (11/12 0454) SpO2:  [95 %-96 %] 96 % (11/12 0454) Weight:  [61.8 kg] 61.8 kg (11/12 0150)   Physical Exam  Constitutional: Appears well-developed and well-nourished.  Psych: Affect appropriate to situation Eyes: No scleral injection HENT: No OP obstrucion MSK: no joint deformities.  Cardiovascular: Normal rate and regular rhythm.  Respiratory: Effort normal, non-labored breathing GI: Soft.  No distension. There is no tenderness.  Skin: Multiple furuncles on the right leg  + neck stiffness  Neuro: Mental Status: Patient is awake, alert, thinks that he is at his neighbors house, gives the correct year 2020, speech is garbled but no signs of aphasia Cranial Nerves: II: Visual Fields are full. Pupils are equal, round, and  reactive to light.   III,IV, VI: EOMI without ptosis or diploplia.  V: Facial sensation is symmetric to temperature VII: Facial movement is symmetric.  VIII: hearing is intact to voice X: Uvula elevates symmetrically XI: Shoulder shrug is symmetric. XII: tongue is midline without atrophy or fasciculations.  Motor: He moves all extremities purposefully, but does not cooperate with formal testing in the legs Sensory: Sensation is symmetric to light touch and temperature in the arms and legs. Deep Tendon Reflexes: 2+ and symmetric in the biceps and patellae.  Cerebellar: No clear ataxia, but does not cooperate with formal testing  I have reviewed labs in epic and the results pertinent to this consultation are: Na 117 11/09 -> 131 11/10 -> 140 11/11 -> 148 today ESR 19 Procalcitonin >4 on admission.   I have reviewed the imaging reports obtained:MRI  brain - punctate cerebellar infarct.   Impression: 68 yo M with AMS in the setting of MRSA bacteremia. I suspect that this represents persistent septic encephalopathy vs meningitis.  I am not sure that an LP would really change management much given that he is already on treatment for MRSA bacteremia with vancomycin at a dose that would be adequate coverage.  Wernicke's encephalopathy would be another consideration given history of alcohol abuse, he has been started on thiamine, I am not certain if he received this at [place], but I will send a B1 level currently.  My suspicion for this is not terribly high but he will receive standard  repletion.  I doubt that his stroke is playing much role given the description, but the presence of a cerebellar infarct on his MRI is highly suspicious for possible embolic disease, though I was not able to review the original images.  Treatment the scenario is antibiotics as opposed to antithrombotics so no need for full stroke work-up.  It would be reasonable, however, until TEE to continue  ASA.  Recommendations: 1) continue vancomycin 2) EEG 3) ammonia, TSH 4) if patient continues to not make improvement, would consider repeating MRI with/without contrast   Roland Rack, MD [facility] [phone]  If 7pm- 7am, please page neurology on call as listed in [place].

## 2019-08-29 DIAGNOSIS — R4182 Altered mental status, unspecified: Secondary | ICD-10-CM

## 2019-08-29 LAB — COMPREHENSIVE METABOLIC PANEL
ALT: 16 U/L (ref 0–44)
AST: 28 U/L (ref 15–41)
Albumin: 1.7 g/dL — ABNORMAL LOW (ref 3.5–5.0)
Alkaline Phosphatase: 77 U/L (ref 38–126)
Anion gap: 8 (ref 5–15)
BUN: 12 mg/dL (ref 8–23)
CO2: 29 mmol/L (ref 22–32)
Calcium: 8 mg/dL — ABNORMAL LOW (ref 8.9–10.3)
Chloride: 110 mmol/L (ref 98–111)
Creatinine, Ser: 0.81 mg/dL (ref 0.61–1.24)
GFR calc Af Amer: >60 mL/min (ref >60)
GFR calc non Af Amer: >60 mL/min (ref >60)
Glucose, Bld: 160 mg/dL — ABNORMAL HIGH (ref 70–99)
Potassium: 3.5 mmol/L (ref 3.5–5.1)
Sodium: 147 mmol/L — ABNORMAL HIGH (ref 135–145)
Total Bilirubin: 0.7 mg/dL (ref 0.3–1.2)
Total Protein: 5.2 g/dL — ABNORMAL LOW (ref 6.5–8.1)

## 2019-08-29 LAB — CBC
HCT: 34.4 % — ABNORMAL LOW (ref 39.0–52.0)
Hemoglobin: 11.6 g/dL — ABNORMAL LOW (ref 13.0–17.0)
MCH: 32.5 pg (ref 26.0–34.0)
MCHC: 33.7 g/dL (ref 30.0–36.0)
MCV: 96.4 fL (ref 80.0–100.0)
Platelets: 195 10*3/uL (ref 150–400)
RBC: 3.57 MIL/uL — ABNORMAL LOW (ref 4.22–5.81)
RDW: 13.9 % (ref 11.5–15.5)
WBC: 31.9 10*3/uL — ABNORMAL HIGH (ref 4.0–10.5)
nRBC: 0 % (ref 0.0–0.2)

## 2019-08-29 LAB — GLUCOSE, CAPILLARY
Glucose-Capillary: 104 mg/dL — ABNORMAL HIGH (ref 70–99)
Glucose-Capillary: 114 mg/dL — ABNORMAL HIGH (ref 70–99)
Glucose-Capillary: 114 mg/dL — ABNORMAL HIGH (ref 70–99)
Glucose-Capillary: 116 mg/dL — ABNORMAL HIGH (ref 70–99)
Glucose-Capillary: 120 mg/dL — ABNORMAL HIGH (ref 70–99)
Glucose-Capillary: 122 mg/dL — ABNORMAL HIGH (ref 70–99)

## 2019-08-29 LAB — HEMOGLOBIN AND HEMATOCRIT, BLOOD
HCT: 42.6 % (ref 39.0–52.0)
Hemoglobin: 14.3 g/dL (ref 13.0–17.0)

## 2019-08-29 MED ORDER — PRO-STAT SUGAR FREE PO LIQD
30.0000 mL | Freq: Two times a day (BID) | ORAL | Status: DC
  Administered 2019-08-29 – 2019-09-06 (×11): 30 mL
  Filled 2019-08-29 (×14): qty 30

## 2019-08-29 MED ORDER — OSMOLITE 1.5 CAL PO LIQD
1000.0000 mL | ORAL | Status: DC
  Administered 2019-08-29 – 2019-09-01 (×4): 1000 mL
  Filled 2019-08-29 (×12): qty 1000

## 2019-08-29 MED ORDER — JEVITY 1.2 CAL PO LIQD: 1000.0000 mL | ORAL | Status: DC

## 2019-08-29 MED ORDER — ADULT MULTIVITAMIN W/MINERALS CH
1.0000 | ORAL_TABLET | Freq: Every day | ORAL | Status: DC
  Administered 2019-08-30 – 2019-09-01 (×3): 1
  Filled 2019-08-29 (×4): qty 1

## 2019-08-29 MED ORDER — FOLIC ACID 1 MG PO TABS
1.0000 mg | ORAL_TABLET | Freq: Every day | ORAL | Status: DC
  Administered 2019-08-31 – 2019-09-01 (×2): 1 mg via ORAL
  Filled 2019-08-29 (×3): qty 1

## 2019-08-29 MED ORDER — SODIUM CHLORIDE 0.9 % IV SOLN
1.0000 mg | Freq: Every day | INTRAVENOUS | Status: DC
  Administered 2019-08-29 – 2019-09-02 (×3): 1 mg via INTRAVENOUS
  Filled 2019-08-29 (×7): qty 0.2

## 2019-08-29 NOTE — Plan of Care (Signed)
  Problem: Education: Goal: Knowledge of disease or condition will improve Outcome: Progressing Goal: Knowledge of secondary prevention will improve Outcome: Progressing Goal: Knowledge of patient specific risk factors addressed and post discharge goals established will improve Outcome: Progressing Goal: Individualized Educational Video(s) Outcome: Progressing   Problem: Coping: Goal: Will verbalize positive feelings about self Outcome: Progressing Goal: Will identify appropriate support needs Outcome: Progressing   Problem: Health Behavior/Discharge Planning: Goal: Ability to manage health-related needs will improve Outcome: Progressing   Problem: Self-Care: Goal: Ability to participate in self-care as condition permits will improve Outcome: Progressing Goal: Verbalization of feelings and concerns over difficulty with self-care will improve Outcome: Progressing Goal: Ability to communicate needs accurately will improve Outcome: Progressing   Problem: Nutrition: Goal: Risk of aspiration will decrease Outcome: Progressing Goal: Dietary intake will improve Outcome: Progressing   Problem: Ischemic Stroke/TIA Tissue Perfusion: Goal: Complications of ischemic stroke/TIA will be minimized Outcome: Progressing   Problem: Education: Goal: Knowledge of General Education information will improve Description: Including pain rating scale, medication(s)/side effects and non-pharmacologic comfort measures Outcome: Progressing   Problem: Health Behavior/Discharge Planning: Goal: Ability to manage health-related needs will improve Outcome: Progressing   Problem: Clinical Measurements: Goal: Ability to maintain clinical measurements within normal limits will improve Outcome: Progressing Goal: Will remain free from infection Outcome: Progressing Goal: Diagnostic test results will improve Outcome: Progressing Goal: Respiratory complications will improve Outcome: Progressing Goal:  Cardiovascular complication will be avoided Outcome: Progressing   Problem: Activity: Goal: Risk for activity intolerance will decrease Outcome: Progressing   Problem: Nutrition: Goal: Adequate nutrition will be maintained Outcome: Progressing   Problem: Coping: Goal: Level of anxiety will decrease Outcome: Progressing   Problem: Elimination: Goal: Will not experience complications related to bowel motility Outcome: Progressing Goal: Will not experience complications related to urinary retention Outcome: Progressing   Problem: Pain Managment: Goal: General experience of comfort will improve Outcome: Progressing   Problem: Safety: Goal: Ability to remain free from injury will improve Outcome: Progressing   Problem: Skin Integrity: Goal: Risk for impaired skin integrity will decrease Outcome: Progressing   Problem: Education: Goal: Knowledge of disease or condition will improve Outcome: Progressing Goal: Understanding of discharge needs will improve Outcome: Progressing   Problem: Health Behavior/Discharge Planning: Goal: Ability to identify changes in lifestyle to reduce recurrence of condition will improve Outcome: Progressing Goal: Identification of resources available to assist in meeting health care needs will improve Outcome: Progressing   Problem: Physical Regulation: Goal: Complications related to the disease process, condition or treatment will be avoided or minimized Outcome: Progressing   Problem: Safety: Goal: Ability to remain free from injury will improve Outcome: Progressing   Ranulfo Kall, BSN, RN 

## 2019-08-29 NOTE — Procedures (Signed)
Patient Name: Ryan Lane  MRN: [id number]  Epilepsy Attending: Lora Havens  Referring Physician/Provider: Dr Payton Emerald Date: 08/28/2019 Duration: 23.35 mins  Patient history: 68yo M with ams. EEG to evaluate for seizures  Level of alertness: awake, asleep  AEDs during EEG study: ativan  Technical aspects: This EEG study was done with scalp electrodes positioned according to the 10-20 International system of electrode placement. Electrical activity was acquired at a sampling rate of 500Hz  and reviewed with a high frequency filter of 70Hz  and a low frequency filter of 1Hz . EEG data were recorded continuously and digitally stored.   DESCRIPTION: During awake state, no clear posterior dominant rhythm was seen. Sleep was characterized by vertex waves,sleep spindles ( 12-14hz ), maximal frontocentral. EEG also showed continuous generalized 3-6Hz  theta-delta slowing admixed with an excessive amount of 15 to 18 Hz, 2-3 uV beta activity with irregular morphology distributed symmetrically and diffusely. Hyperventilation and photic stimulation were not performed.  ABNORMALITY - Continuous slow, generalized - Excessive beta, generalized  IMPRESSION: This study is suggestive of mild diffuse encephalopathy, non specific to etiology. No seizures or epileptiform discharges were seen throughout the recording.  The excessive beta activity seen in the background is most likely due to the effect of benzodiazepine and is a benign EEG pattern.  Lora Havens           Patient Name: Ryan Lane  MRN: [id number]  Epilepsy Attending: Lora Havens  Referring Physician/Provider:  Date: @TODAY @  Duration: 2  Patient history:   Level of alertness:   AEDs during EEG study:   Technical aspects: This EEG study was done with scalp electrodes positioned according to the 10-20 International system of electrode placement. Electrical activity was acquired at a sampling rate of  500Hz  and reviewed with a high frequency filter of 70Hz  and a low frequency filter of 1Hz . EEG data were recorded continuously and digitally stored.   BACKGROUND ACTIVITY: Posterior dominant rhythm: The posterior dominant rhythm consists of 9-10 Hz activity of moderate voltage (25-35 uV) seen predominantly in posterior head regions, symmetric and reactive to eye opening and eye closing.          Beta: There is an  Slowing: None  EPILEPTIFORM ACTIVITY: Interictal epileptiform activity: None  Ictal Activity: None  OTHER EVENTS: None  SLEEP RECORDINGS:  Only awake and drowsy states were recorded. Drowsiness was characterized by attenuation of the posterior background rhythm.  ACTIVATION PROCEDURES:  Hyperventilation and photic stimulation were not performed.  IMPRESSION: This study is within normal limits. However, only wakefulness and drowsiness were recorded. If suspicion for interictal activity remains a concern, a prolonged study including sleep should be considered.   No seizures or epileptiform discharges were seen throughout the recording.

## 2019-08-29 NOTE — Evaluation (Signed)
Occupational Therapy Evaluation Patient Details Name: Ryan ApleyRichard Lane MRN: [id number] DOB: November 26, 1950 Today's Date: 08/29/2019    History of Present Illness Pt is a 68 y.o. M with significant PMH of alcohol and polysubstance abuse who presents to ER with fever, chills, body aches and diarrhea. Admitted with pneumonia/bacteremia with MRSA, [facility] 08[facility]/2019 for further treatment and possible TEE. CT chest/abd/pelvis showing left psoas intramuscular hematoma with evidence of small active bleed. 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    Clinical Impression   Pt admitted with above and presents to OT with lethargy impacting participation in evaluation.  Pt able to follow <50% of commands and presents with unintelligible, garbled speech.  Pt present with decreased functional mobility and ability to complete ADLs secondary to Rt sided weakness, decreased cervical and RUE ROM, poor sitting balance, and impaired cognition with difficulty following directions.  Pt required total assist +2 for bed mobility and mod-total assist for static sitting balance.  HR fluctuated 127-143 seated EOB and O2 sats remained >90% on 4L O2.  Lethargy and difficulty following directions greatly impacted participation in evaluation - suspect that pt will improve as mentation improves.  Pt will benefit from OT acutely to decrease burden of care and increase ability to complete ADLs.  Due to decreased caregiver support, recommend SNF for post acute rehab at discharge.    Follow Up Recommendations  SNF    Equipment Recommendations  Other (comment)(defer to next venue of care)       Precautions / Restrictions Precautions Precautions: Fall Restrictions Weight Bearing Restrictions: No      Mobility Bed Mobility Overal bed mobility: Needs Assistance Bed Mobility: Supine to Sit;Sit to Supine     Supine to sit: Total assist;+2 for physical assistance Sit to supine: Total assist;+2 for  physical assistance   General bed mobility comments: TotalA + 2 for bed mobility, limited by lethargy  Transfers                 General transfer comment: unable    Balance Overall balance assessment: Needs assistance Sitting-balance support: Feet supported Sitting balance-Leahy Scale: Poor Sitting balance - Comments: Increased cervical and trunk flexion in addition to left lateral lean. Decreased balance reactions likely due to lethargy; requiring mod-totalA Postural control: Left lateral lean                                 ADL either performed or assessed with clinical judgement   ADL Overall ADL's : Needs assistance/impaired Eating/Feeding: NPO   Grooming: Wash/dry hands;Wash/dry face;Maximal assistance Grooming Details (indicate cue type and reason): attempted hand over hand with pt not initiating any functional movement, however later in session due spontaneously wipe mouth with LUE Upper Body Bathing: Total assistance   Lower Body Bathing: +2 for physical assistance;Total assistance             Toilet Transfer Details (indicate cue type and reason): Unsafe to complete transfers on eval due to forward lean, decreased initiation and following directions, and minimal balance reactions         Functional mobility during ADLs: Maximal assistance;+2 for physical assistance;+2 for safety/equipment;Cueing for safety;Cueing for sequencing       Vision   Vision Assessment?: Vision impaired- to be further tested in functional context Additional Comments: difficult to assess as pt kept eyes closed majority of session  Pertinent Vitals/Pain Pain Assessment: Faces Faces Pain Scale: Hurts little more Pain Location: grimacing with cervical stretching/movement Pain Descriptors / Indicators: Grimacing Pain Intervention(s): Monitored during session;Repositioned        Extremity/Trunk Assessment Upper Extremity Assessment Upper Extremity  Assessment: RUE deficits/detail;LUE deficits/detail(Intermittent functional use of LUE, minimal active use of RUE) RUE Deficits / Details: strength grossly 2/5, loose grasp RUE Sensation: decreased light touch RUE Coordination: decreased fine motor;decreased gross motor LUE Deficits / Details: strength grossly 3/5. LUE Sensation: decreased light touch   Lower Extremity Assessment Lower Extremity Assessment: RLE deficits/detail;LLE deficits/detail RLE Deficits / Details: Grossly 2-/5, able to perform limited hip flexion and LAQ seated on edge of bed LLE Deficits / Details: Moving spontaneously   Cervical / Trunk Assessment Cervical / Trunk Assessment: Kyphotic;Other exceptions Cervical / Trunk Exceptions: forward head posture   Communication Communication Communication: Expressive difficulties(garbled, unintelligible speech)   Cognition Arousal/Alertness: Lethargic Behavior During Therapy: Flat affect Overall Cognitive Status: Impaired/Different from baseline Area of Impairment: Following commands;Awareness                       Following Commands: Follows one step commands inconsistently   Awareness: Intellectual   General Comments: Pt lethargic, keeping eyes closed through majority of evaluation. Following < 50% of commands intermittently. Garbled, unintelligible speech, unable to nod yes/no in response to questions.               Home Living Family/patient expects to be discharged to:: Unsure                                 Additional Comments: Pt unable to provide; per chart review, estranged from 2 sons and sister and nephew are primary contact      Prior Functioning/Environment Level of Independence: Independent        Comments: Assume independent; pt unable to confirm        OT Problem List: Decreased strength;Decreased range of motion;Decreased activity tolerance;Impaired balance (sitting and/or standing);Decreased cognition;Decreased  coordination;Decreased safety awareness;Decreased knowledge of use of DME or AE;Cardiopulmonary status limiting activity;Impaired sensation;Impaired UE functional use;Pain      OT Treatment/Interventions: Self-care/ADL training;Neuromuscular education;DME and/or AE instruction;Therapeutic activities;Cognitive remediation/compensation;Visual/perceptual remediation/compensation;Patient/family education;Balance training    OT Goals(Current goals can be found in the care plan section) Acute Rehab OT Goals Patient Stated Goal: unable OT Goal Formulation: Patient unable to participate in goal setting Time For Goal Achievement: 09/12/19 Potential to Achieve Goals: Fair  OT Frequency: Min 2X/week   Barriers to D/C: Decreased caregiver support  unsure of caregiver support; per chart review pt is estranged from 2 sons - nephew and sister are primary contact       Co-evaluation PT/OT/SLP Co-Evaluation/Treatment: Yes Reason for Co-Treatment: Complexity of the patient's impairments (multi-system involvement);Necessary to address cognition/behavior during functional activity;For patient/therapist safety;To address functional/ADL transfers PT goals addressed during session: Mobility/safety with mobility OT goals addressed during session: ADL's and self-care      AM-PAC OT "6 Clicks" Daily Activity     Outcome Measure Help from another person eating meals?: Total Help from another person taking care of personal grooming?: Total Help from another person toileting, which includes using toliet, bedpan, or urinal?: Total Help from another person bathing (including washing, rinsing, drying)?: Total Help from another person to put on and taking off regular upper body clothing?: Total Help from another person to put on and taking off regular  lower body clothing?: Total 6 Click Score: 6   End of Session Equipment Utilized During Treatment: Oxygen Nurse Communication: Mobility status;Other  (comment)(lethargy)  Activity Tolerance: Patient limited by lethargy Patient left: in bed;with call bell/phone within reach;with bed alarm set;with restraints reapplied  OT Visit Diagnosis: Unsteadiness on feet (R26.81);Muscle weakness (generalized) (M62.81);Other symptoms and signs involving the nervous system (R29.898);Other symptoms and signs involving cognitive function;Pain Pain - part of body: (generalized)                Time: 2585-2778 OT Time Calculation (min): 22 min Charges:  OT General Charges $OT Visit: 1 Visit OT Evaluation $OT Eval Moderate Complexity: [place], [place], [phone] 08/29/2019, 9:29 AM

## 2019-08-29 NOTE — Progress Notes (Signed)
Patient Demographics:    Ryan Lane, is a 68 y.o. male, DOB - 12/12/1950, ZOX:[id number]  Admit date - 08/28/2019   Admitting Physician Hughie Closs, MD  Outpatient Primary MD for the patient is Patient, No Pcp Per  LOS - 1   No chief complaint on file.       Subjective:    Sheral Apley today has  no emesis,  No chest pain,  -Remains lethargic and somewhat  responsive  Assessment  & Plan :    Principal Problem:   MRSA bacteremia Active Problems:   Alcohol withdrawal (HCC)   Acute CVA (cerebrovascular accident) (HCC)   Hyponatremia  Brief Summary:- 68 y.o. male with a history of alcohol abuse who was brought to [facility] on November 7 and found to have pneumonia/bacteremia with MRSA, transferred to [facility] on 08/28/2019 for further treatment and possible TEE EEG and TTE at [facility] were largely unremarkable   CT Chest/CT Abd/Pelvis 1. Left psoas intramuscular hematoma with evidence of small active bleed. 2. No bowel obstruction or active inflammation. Normal appendix. 3. Sigmoid diverticulosis. 4. Bilateral pulmonary densities with pattern concerning for fungal infection, abscesses, TB, or septic emboli. Other etiologies are not excluded. Clinical correlation is recommended. Trace left pleural effusion. 5. Slight heterogeneous nephrogram may be artifactual or represent pyelonephritis. Correlation with urinalysis recommended.   A/p 1) Left psoas intramuscular hematoma with evidence of small active bleed--- stop subcu Lovenox, stop aspirin, -Continue to monitor serial H&H -Hemoglobin currently down to 11.6 from 15.4 -Discussed with radiologist no specific intervention required at this time  2) acute toxic/metabolic encephalopathy--- in the setting of MRSA bacteremia and pneumonia--- continue IV vancomycin,  -Repeat EEG with diffuse encephalopathy without  epileptiform findings -Continue thiamine replacement, low index of suspicion for Warnicke's encephalopathy (history of longstanding alcohol abuse) -Ammonia is 45, and TSH is 0.489 =-Significant leukocytosis persist  3)Cerebellar CVA--MRI from [facility] with acute stroke (punctate cerebellar infarct/ right cerebellar and right corona radiata )----- may be embolic, however unable to give aspirin due to #1 above - repeat MRI Brain requested by neurology service    4)Sepsis secondary to MRSA Bacteremia and pneumonia--- WBC remains elevated above 30 K,  - lactic acid of 2.2--continue IV vancomycin, repeat blood cultures pending  5)Polysubstance Abuse--- as per patient's nephew [name]--patient has history of methamphetamine, THC alcohol and other illicit drug use  6)Social/Ethics--patient is estranged from his 2 sons over the years due to polysubstance abuse, patient sister [name] and patient's nephew [name] his primary contact - I called and d/w nephew [name] -[phone] -palliative care consult requested to delineate goals of care  7) dysphagia/diet--- place CorTrac/Keofeed tube for feeding purposes, -tube feeding per dietitian/nutritionist  Disposition/Need for in-Hospital Stay- patient unable to be discharged at this time due to --- sepsis secondary to MRSA bacteremia and pneumonia requiring IV antibiotics as well as acute stroke requiring further monitoring  Code Status : Full code  Family Communication:     d/w nephew [name] -[phone]  Disposition Plan  : TBD  Consults  :  Neurology/ID/Radiology  DVT Prophylaxis  :  - SCDs (active psoas of bleeding)  Lab Results  Component Value Date   PLT 195 08/29/2019  Inpatient Medications  Scheduled Meds:  feeding supplement (OSMOLITE 1.5 CAL)  1,000 mL Per Tube Q24H   feeding supplement (PRO-STAT SUGAR FREE 64)  30 mL Per Tube BID   folic acid  1 mg Oral Daily   LORazepam  0-4 mg Intravenous  Q6H   Followed by   Melene Muller ON 08/30/2019] LORazepam  0-4 mg Intravenous Q12H   [START ON 08/30/2019] multivitamin with minerals  1 tablet Per Tube Daily   thiamine  100 mg Oral Daily   Or   thiamine  100 mg Intravenous Daily   Continuous Infusions:  dextrose 5 % and 0.45 % NaCl with KCl 10 mEq/L 125 mL/hr at 08/29/19 1941   folic acid (FOLVITE) IVPB Stopped (08/29/19 1401)   vancomycin 1,000 mg (08/29/19 1713)   PRN Meds:.acetaminophen **OR** acetaminophen (TYLENOL) oral liquid 160 mg/5 mL **OR** acetaminophen, LORazepam **OR** LORazepam    Anti-infectives (From admission, onward)   Start     Dose/Rate Route Frequency Ordered Stop   08/28/19 1600  vancomycin (VANCOCIN) IVPB 1000 mg/200 mL premix     1,000 mg 200 mL/hr over 60 Minutes Intravenous Every 12 hours 08/28/19 0409     08/28/19 0315  vancomycin (VANCOCIN) 1,250 mg in sodium chloride 0.9 % 250 mL IVPB  Status:  Discontinued     1,250 mg 166.7 mL/hr over 90 Minutes Intravenous  Once 08/28/19 0307 08/28/19 0549        Objective:   Vitals:   08/29/19 1200 08/29/19 1300 08/29/19 1704 08/29/19 1800  BP:      Pulse: 90 98    Resp: 18   (!) 22  Temp: 97.9 F (36.6 C)     TempSrc: Oral  Oral   SpO2:      Weight:        Wt Readings from Last 3 Encounters:  08/29/19 61.3 kg     Intake/Output Summary (Last 24 hours) at 08/29/2019 2002 Last data filed at 08/29/2019 1639 Gross per 24 hour  Intake 2227.66 ml  Output 2000 ml  Net 227.66 ml   Physical Exam  Gen:-Very lethargic HEENT:- [place].AT, No sclera icterus Neck-Supple Neck,No JVD,.  Lungs-diminished breath sounds with scattered rhonchi CV- S1, S2 normal   abd-  +ve B.Sounds, Abd Soft, No tenderness,    Extremity/Skin:- No  edema, pedal pulses present  Psych-inconsistently responsive, very lethargic Neuro-generalized weakness, some left-sided deficits, neuro exam is limited due to severe lethargy and inability to consistently follow commands     Data  Review:   Micro Results Recent Results (from the past 240 hour(s))  Culture, blood (routine x 2)     Status: None (Preliminary result)   Collection Time: 08/28/19  4:52 PM   Specimen: BLOOD LEFT HAND  Result Value Ref Range Status   Specimen Description BLOOD LEFT HAND  Final   Special Requests AEROBIC BOTTLE ONLY Blood Culture adequate volume  Final   Culture   Final    NO GROWTH < 24 HOURS Performed at [facility], [address] [place], [place] [address]    Report Status PENDING  Incomplete  Culture, blood (routine x 2)     Status: None (Preliminary result)   Collection Time: 08/28/19  4:52 PM   Specimen: BLOOD LEFT HAND  Result Value Ref Range Status   Specimen Description BLOOD LEFT HAND  Final   Special Requests AEROBIC BOTTLE ONLY Blood Culture adequate volume  Final   Culture  Setup Time   Final  GRAM POSITIVE COCCI AEROBIC BOTTLE ONLY CRITICAL VALUE NOTED.  VALUE IS CONSISTENT WITH PREVIOUSLY REPORTED AND CALLED VALUE.    Culture   Final    CULTURE REINCUBATED FOR BETTER GROWTH Performed at [facility], [address] [place], [place] [address]    Report Status PENDING  Incomplete  Culture, blood (routine x 2)     Status: None (Preliminary result)   Collection Time: 08/28/19  8:34 PM   Specimen: BLOOD LEFT HAND  Result Value Ref Range Status   Specimen Description BLOOD LEFT HAND  Final   Special Requests   Final    BOTTLES DRAWN AEROBIC ONLY Blood Culture results may not be optimal due to an inadequate volume of blood received in culture bottles   Culture   Final    NO GROWTH < 24 HOURS Performed at [facility], [address] [place], [place] [address]    Report Status PENDING  Incomplete  Culture, blood (routine x 2)     Status: None (Preliminary result)   Collection Time: 08/28/19  8:34 PM   Specimen: BLOOD RIGHT HAND  Result Value Ref Range Status   Specimen Description BLOOD RIGHT HAND  Final   Special Requests   Final     BOTTLES DRAWN AEROBIC ONLY Blood Culture results may not be optimal due to an inadequate volume of blood received in culture bottles   Culture  Setup Time   Final    GRAM POSITIVE COCCI IN CLUSTERS AEROBIC BOTTLE ONLY CRITICAL RESULT CALLED TO, READ BACK BY AND VERIFIED WITH: PHARMD CHRIS WALSTON [id number] FCP    Culture   Final    NO GROWTH < 24 HOURS Performed at [facility], [address] [place], [place] [address]    Report Status PENDING  Incomplete    Radiology Reports Ct Chest W Contrast  Result Date: 08/28/2019 CLINICAL DATA:  68 year old male with fever and leukocytosis. EXAM: CT CHEST, ABDOMEN, AND PELVIS WITH CONTRAST TECHNIQUE: Multidetector CT imaging of the chest, abdomen and pelvis was performed following the standard protocol during bolus administration of intravenous contrast. CONTRAST:  OMNIPAQUE IOHEXOL 300 MG/ML  SOLN COMPARISON:  Chest radiograph dated 08/22/2019. FINDINGS: CT CHEST FINDINGS Cardiovascular: There is no cardiomegaly or pericardial effusion. Calcification of the mitral annulus noted. There is mild atherosclerotic calcification of the thoracic aorta. No aneurysmal dilatation or dissection. The central pulmonary arteries are unremarkable as visualized. Mediastinum/Nodes: No hilar or mediastinal adenopathy. The esophagus and the thyroid gland are grossly unremarkable. No mediastinal fluid collection. Lungs/Pleura: Trace left pleural effusion. Scattered clusters of ground-glass and nodular opacities with central cavitation primarily involving the upper lobes noted. A patchy area of airspace opacity is noted in the right upper lobe with areas of cavitation. Findings may represent fungal infection, abscesses, TB, or septic emboli. Other etiologies are not excluded. Clinical correlation is recommended. There is no pneumothorax. Mucus content noted in the distal trachea extending into the left mainstem bronchus. The central airways however remain patent.  Musculoskeletal: No chest wall mass or suspicious bone lesions identified. CT ABDOMEN PELVIS FINDINGS No intra-abdominal free air or free fluid. Hepatobiliary: The liver is unremarkable as visualized. No intrahepatic biliary ductal dilatation. The gallbladder is unremarkable. Pancreas: Unremarkable. No pancreatic ductal dilatation or surrounding inflammatory changes. Spleen: Normal in size without focal abnormality. Adrenals/Urinary Tract: The adrenal glands are unremarkable. There is no hydronephrosis on either side. There is symmetric enhancement and excretion of contrast by both kidneys. Slightly  striated enhancement pattern of the renal parenchyma may be artifactual. Correlation with urinalysis recommended to exclude pyelonephritis. A subcentimeter left renal hypodense lesion is too small to characterize. The visualized ureters appear unremarkable. The urinary bladder is predominantly collapsed. There is apparent diffuse thickening of the bladder wall which may be partly related to underdistention. Cystitis is not excluded. Correlation with urinalysis recommended. Stomach/Bowel: A rectal tube and contrast noted throughout the colon. There is no bowel obstruction or active inflammation. There are scattered sigmoid diverticula without active inflammation. The appendix is normal. Vascular/Lymphatic: Mild aortoiliac atherosclerotic disease. The IVC is unremarkable. No portal venous gas. There is no adenopathy. Reproductive: The prostate and seminal vesicles are grossly unremarkable. Other: There is intramuscular hematoma involving the left psoas muscle extending to the left iliacus muscle. The hematoma in the left psoas muscle measures approximately 6.5 x 5.3 cm in greatest axial dimensions and 10 cm in craniocaudal length. There is linear contrast blushing within the left psoas muscle suggestive of active bleed. There is a 3.7 x 3.5 cm low attenuating/cystic structure in the region of the left inguinal canal which  may represent a seroma or an old hematoma, or possibly a lymphocele. A necrotic mass or lymph node is favored less likely but not excluded. Correlation with clinical exam and follow-up recommended. Ultrasound may provide better evaluation of this structure. Musculoskeletal: Degenerative changes of the spine. No acute osseous pathology. IMPRESSION: 1. Left psoas intramuscular hematoma with evidence of small active bleed. 2. No bowel obstruction or active inflammation. Normal appendix. 3. Sigmoid diverticulosis. 4. Bilateral pulmonary densities with pattern concerning for fungal infection, abscesses, TB, or septic emboli. Other etiologies are not excluded. Clinical correlation is recommended. Trace left pleural effusion. 5. Slight heterogeneous nephrogram may be artifactual or represent pyelonephritis. Correlation with urinalysis recommended. 6. Aortic Atherosclerosis (ICD10-I70.0). These results were called by telephone at the time of interpretation on 08/28/2019 at 1:45 pm to provider [facility] , who verbally acknowledged these results. Electronically Signed   By: Anner Crete M.D.   On: 08/28/2019 13:51   Ct Abdomen Pelvis W Contrast  Result Date: 08/28/2019 CLINICAL DATA:  68 year old male with fever and leukocytosis. EXAM: CT CHEST, ABDOMEN, AND PELVIS WITH CONTRAST TECHNIQUE: Multidetector CT imaging of the chest, abdomen and pelvis was performed following the standard protocol during bolus administration of intravenous contrast. CONTRAST:  11mL OMNIPAQUE IOHEXOL 300 MG/ML  SOLN COMPARISON:  Chest radiograph dated 08/22/2019. FINDINGS: CT CHEST FINDINGS Cardiovascular: There is no cardiomegaly or pericardial effusion. Calcification of the mitral annulus noted. There is mild atherosclerotic calcification of the thoracic aorta. No aneurysmal dilatation or dissection. The central pulmonary arteries are unremarkable as visualized. Mediastinum/Nodes: No hilar or mediastinal adenopathy. The esophagus  and the thyroid gland are grossly unremarkable. No mediastinal fluid collection. Lungs/Pleura: Trace left pleural effusion. Scattered clusters of ground-glass and nodular opacities with central cavitation primarily involving the upper lobes noted. A patchy area of airspace opacity is noted in the right upper lobe with areas of cavitation. Findings may represent fungal infection, abscesses, TB, or septic emboli. Other etiologies are not excluded. Clinical correlation is recommended. There is no pneumothorax. Mucus content noted in the distal trachea extending into the left mainstem bronchus. The central airways however remain patent. Musculoskeletal: No chest wall mass or suspicious bone lesions identified. CT ABDOMEN PELVIS FINDINGS No intra-abdominal free air or free fluid. Hepatobiliary: The liver is unremarkable as visualized. No intrahepatic biliary ductal dilatation. The gallbladder is unremarkable. Pancreas: Unremarkable. No pancreatic ductal dilatation  or surrounding inflammatory changes. Spleen: Normal in size without focal abnormality. Adrenals/Urinary Tract: The adrenal glands are unremarkable. There is no hydronephrosis on either side. There is symmetric enhancement and excretion of contrast by both kidneys. Slightly striated enhancement pattern of the renal parenchyma may be artifactual. Correlation with urinalysis recommended to exclude pyelonephritis. A subcentimeter left renal hypodense lesion is too small to characterize. The visualized ureters appear unremarkable. The urinary bladder is predominantly collapsed. There is apparent diffuse thickening of the bladder wall which may be partly related to underdistention. Cystitis is not excluded. Correlation with urinalysis recommended. Stomach/Bowel: A rectal tube and contrast noted throughout the colon. There is no bowel obstruction or active inflammation. There are scattered sigmoid diverticula without active inflammation. The appendix is normal.  Vascular/Lymphatic: Mild aortoiliac atherosclerotic disease. The IVC is unremarkable. No portal venous gas. There is no adenopathy. Reproductive: The prostate and seminal vesicles are grossly unremarkable. Other: There is intramuscular hematoma involving the left psoas muscle extending to the left iliacus muscle. The hematoma in the left psoas muscle measures approximately 6.5 x 5.3 cm in greatest axial dimensions and 10 cm in craniocaudal length. There is linear contrast blushing within the left psoas muscle suggestive of active bleed. There is a 3.7 x 3.5 cm low attenuating/cystic structure in the region of the left inguinal canal which may represent a seroma or an old hematoma, or possibly a lymphocele. A necrotic mass or lymph node is favored less likely but not excluded. Correlation with clinical exam and follow-up recommended. Ultrasound may provide better evaluation of this structure. Musculoskeletal: Degenerative changes of the spine. No acute osseous pathology. IMPRESSION: 1. Left psoas intramuscular hematoma with evidence of small active bleed. 2. No bowel obstruction or active inflammation. Normal appendix. 3. Sigmoid diverticulosis. 4. Bilateral pulmonary densities with pattern concerning for fungal infection, abscesses, TB, or septic emboli. Other etiologies are not excluded. Clinical correlation is recommended. Trace left pleural effusion. 5. Slight heterogeneous nephrogram may be artifactual or represent pyelonephritis. Correlation with urinalysis recommended. 6. Aortic Atherosclerosis (ICD10-I70.0). These results were called by telephone at the time of interpretation on 08/28/2019 at 1:45 pm to provider [facility] , who verbally acknowledged these results. Electronically Signed   By: Elgie CollardArash  Radparvar M.D.   On: 08/28/2019 13:51     CBC Recent Labs  Lab 08/28/19 0245 08/28/19 0627 08/28/19 1403 08/28/19 1652 08/28/19 2034 08/29/19 0204 08/29/19 0724  WBC 29.1* 31.7*  --   --   --    --  31.9*  HGB 14.4 15.4 13.5 13.6 13.4 14.3 11.6*  HCT 42.0 45.3 38.8* 41.7 38.9* 42.6 34.4*  PLT 191 199  --   --   --   --  195  MCV 93.1 96.0  --   --   --   --  96.4  MCH 31.9 32.6  --   --   --   --  32.5  MCHC 34.3 34.0  --   --   --   --  33.7  RDW 13.2 13.4  --   --   --   --  13.9  LYMPHSABS 0.9  --   --   --   --   --   --   MONOABS 2.0*  --   --   --   --   --   --   EOSABS 0.3  --   --   --   --   --   --   BASOSABS 0.0  --   --   --   --   --   --  Chemistries  Recent Labs  Lab 08/28/19 0245 08/28/19 0510 08/28/19 0952 08/28/19 1403 08/29/19 0724  NA 148* 149* 151* 148* 147*  K 3.4* 3.6 3.6 3.6 3.5  CL 112* 113* 113* 111 110  CO2 GLUCOSE 136* 121* 117* 121* 160*  BUN CREATININE 0.56* 0.72 0.70 0.83 0.81  CALCIUM 8.6* 8.8* 8.7* 8.6* 8.0*  MG 1.7  --   --   --   --   AST 32  --   --   --  28  ALT 23  --   --   --  16  ALKPHOS 79  --   --   --  77  BILITOT 1.2  --   --   --  0.7   ------------------------------------------------------------------------------------------------------------------ Recent Labs    08/28/19 0245  CHOL 99  HDL 19*  LDLCALC 67  TRIG 66  CHOLHDL 5.2    Lab Results  Component Value Date   HGBA1C 5.1 08/28/2019   ------------------------------------------------------------------------------------------------------------------ Recent Labs    08/28/19 0510  TSH 0.489   ------------------------------------------------------------------------------------------------------------------ No results for input(s): VITAMINB12, FOLATE, FERRITIN, TIBC, IRON, RETICCTPCT in the last 72 hours.  Coagulation profile No results for input(s): INR, PROTIME in the last 168 hours.  No results for input(s): DDIMER in the last 72 hours.  Cardiac Enzymes No results for input(s): CKMB, TROPONINI, MYOGLOBIN in the last 168 hours.  Invalid input(s):  CK ------------------------------------------------------------------------------------------------------------------ No results found for: BNP   Shon Hale M.D on 08/29/2019 at 8:02 PM  Go to [email or web address] - for contact info  [facility] - Office  [phone]

## 2019-08-29 NOTE — Procedures (Signed)
Cortrak  Person Inserting Tube:  Damarius Karnes M, RD Tube Type:  Cortrak - 43 inches Tube Location:  Left nare Initial Placement:  Stomach Secured by: Bridle Technique Used to Measure Tube Placement:  Documented cm marking at nare/ corner of mouth Cortrak Secured At:  79 cm Procedure Comments:  Cortrak Tube Team Note:  Consult received to place a Cortrak feeding tube.   No x-ray is required. RN may begin using tube.   If the tube becomes dislodged please keep the tube and contact the Cortrak team at [email or web address] ([email or web address]) for replacement.  If after hours and replacement cannot be delayed, place a NG tube and confirm placement with an abdominal x-ray.     Finbar Nippert, MS, RD, LDN, CNSC Inpatient Clinical Dietitian Pager # [phone] After hours/weekend pager # [phone]      

## 2019-08-29 NOTE — Progress Notes (Signed)
    [facility] for Infectious Disease  Date of Admission:  08/28/2019     Total days of antibiotics 2         ASSESSMENT:  Ryan Lane continues to have encephalopathy and is slightly less responsive on my exam today although nursing informed me he worked with physical therapy. Repeat cultures are pending. Awaiting decision to pursue TEE and palliative care evaluation. Continue current dose of vancomycin.  PLAN:  1. Continue vancomycin. 2. Monitor repeat cultures for clearance of bacteremia 3. Continue to monitor renal function while on vancomycin.   Principal Problem:   MRSA bacteremia Active Problems:   Alcohol withdrawal ([place])   Acute CVA (cerebrovascular accident) ([place])   Hyponatremia   . folic acid  1 mg Oral Daily  . LORazepam  0-4 mg Intravenous Q6H   Followed by  . [START ON 08/30/2019] LORazepam  0-4 mg Intravenous Q12H  . multivitamin with minerals  1 tablet Oral Daily  . thiamine  100 mg Oral Daily   Or  . thiamine  100 mg Intravenous Daily    SUBJECTIVE:  Afebrile overnight with stable leukocytosis with WBC count of 31.9. No acute events overnight. Ryan Lane opens his eyes to stimulation but goes back to sleep  Not on File   Review of Systems: Review of Systems  Unable to perform ROS: Acuity of condition    OBJECTIVE: Vitals:   08/29/19 0359 08/29/19 0402 08/29/19 0457 08/29/19 0830  BP:  131/69    Pulse:   (!) 128 90  Resp:  (!) 37  (!) 22  Temp:  98.8 F (37.1 C)  98.8 F (37.1 C)  TempSrc:  Oral  Oral  SpO2:  100%  98%  Weight: 61.3 kg      There is no height or weight on file to calculate BMI.  Physical Exam Constitutional:      General: He is not in acute distress.    Appearance: He is well-developed. He is ill-appearing and diaphoretic.     Comments: Lying in bed; lethargic   Cardiovascular:     Rate and Rhythm: Normal rate and regular rhythm.     Heart sounds: Normal heart sounds.  Pulmonary:     Effort: Pulmonary effort  is normal.     Breath sounds: Normal breath sounds.  Skin:    General: Skin is warm.  Neurological:     Mental Status: He is alert.     Lab Results Lab Results  Component Value Date   WBC 31.9 (H) 08/29/2019   HGB 11.6 (L) 08/29/2019   HCT 34.4 (L) 08/29/2019   MCV 96.4 08/29/2019   PLT 195 08/29/2019    Lab Results  Component Value Date   CREATININE 0.81 08/29/2019   BUN 12 08/29/2019   NA 147 (H) 08/29/2019   K 3.5 08/29/2019   CL 110 08/29/2019   CO2 29 08/29/2019    Lab Results  Component Value Date   ALT 16 08/29/2019   AST 28 08/29/2019   ALKPHOS 77 08/29/2019   BILITOT 0.7 08/29/2019     Microbiology: No results found for this or any previous visit (from the past 240 hour(s)).   Terri Piedra, NP [facility] [phone] Pager  08/29/2019  9:34 AM

## 2019-08-29 NOTE — Progress Notes (Signed)
PHARMACY - PHYSICIAN COMMUNICATION CRITICAL VALUE ALERT - BLOOD CULTURE IDENTIFICATION (BCID)  Ryan Lane is an 68 y.o. male who presented to [facility] on 08/28/2019 with a chief complaint of MRSA bacteremia per the discharge summary from [facility].  Assessment: blood culture positive for 1/4 aerobic GPC in clusters (likely MRSA)  Name of physician (or Provider) Contacted: Jimmy Footman, PharmD who is rounding with Dr. Linus Salmons and the ID team  Current antibiotics: Vancomycin  Changes to prescribed antibiotics recommended:  Patient is on recommended antibiotics - No changes needed  Kennon Holter, PharmD PGY1 Ambulatory Care Pharmacy Resident [employer] Phone: [phone] 08/29/2019  2:00 PM

## 2019-08-29 NOTE — Progress Notes (Signed)
Initial Nutrition Assessment  RD working remotely.  DOCUMENTATION CODES:   Not applicable  INTERVENTION:   -Once cortrak is placed:  Initiate Osmolite 1.5 @ 25 ml/hr via cortrak tube and increase by 10 ml every 8 hours to goal rate of 55 ml/hr.   30 ml Prostat BID.    MVI with minerals daily  Tube feeding regimen provides 2180 kcal (100% of needs), 113 grams of protein, and 1006 ml of H2O.   -Recommend monitor K, Mg, and Phos daily x 3 days and replete as necessary, as pt is at high refeeding risk  NUTRITION DIAGNOSIS:   Inadequate oral intake related to inability to eat as evidenced by NPO status.  GOAL:   Patient will meet greater than or equal to 90% of their needs  MONITOR:   Diet advancement, Labs, Weight trends, TF tolerance  REASON FOR ASSESSMENT:   Other (Comment)    ASSESSMENT:   Ryan Lane is a 68 y.o. male with history of alcohol and tobacco abuse was brought to the ER at [facility] on August 23, 2019 that is about 5 days ago with fever chills body aches diarrhea decreased appetite.  In the ER he was found to have leukocytosis and bandemia with lymphopenia.  Patient's lab work showed hyponatremia of 115 lactic acidosis of 3 procalcitonin of 4.95 COVID-19 test was negative chest x-ray showed right middle lobe opacity concerning for pneumonia.  He was started on ceftriaxone and azithromycin for community-acquired pneumonia.  Subsequent which blood cultures grew MRSA.  Hospitalist at [place] contacted with infectious disease consultant at [facility] and antibiotics were changed to vancomycin and had 2D echo which did not show anything acute and an infectious disease consultant requested patient will need TEE.  The following day patient had a fall following which patient has become more confused and also had some garbled speech.  Plan was to get a CT head and MRI brain but since patient was not lying flat steadily it was initially unable to be done.   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  Patient was found to be mildly weak on the right side with garbled speech.  Getting more confused.  Since patient is also alcoholic was placed on CIWA protocol along with thiamine.  Per records patient's EEG was unremarkable.  Patient was placed on fluids initially for hyponatremia which did not improve with fluids.  Records show that patient was planned to be started on tolvaptan.  Per the report patient's last sodium was 116 WBC count around 14,000 creatinine was normal.  Patient was transferred to [facility] for further management of MRSA bacteremia requiring TEE and stroke.  Pt admitted with MRSA bacteremia, acute CVA, and acute encephalopathy.   11/12- unable to sustain alertness to participate in BSE 11/13- s/p BSE- recommend continued NPO  15.6% x 15 months  Attempted to speak with pt via phone, however, no answer. Unable to obtain further nutrition history at this time.   Per cortrak tube team, plan to place cortrak tube today. Case discussed with Dr. Denton Brick; received verbal permission to start TF once cortrak is placed.   Per SLP note, pt is lethargic, however, improved enough to participate in limited swallowing assessment. Pt speech also difficult to understand currently.   No height currently in chart to assess IBW. Reviewed wt hx per Care Everywhere; pt has experienced a 15.6% wt loss over the past 15 months, which while  not significant for time frame is concerning given pt's decreased mental status, inability to eat, and polysubstance abuse. Highly suspect pt with malnutrition, however, unable to assess at this time.   Per chart review, pt with decreased oral intake PTA. He also has a longstanding history of polysubstance abuse. Palliative care consult pending.  Medications reviewed and include folic acid, MVI, thiamine, and dextrose 5%, vancomycin, and 0.45% NaCl  with KCl 10 mEq/L infusion @ 125 ml/hr.   Labs reviewed: Na: 147, CBGS: 114-116.   Diet Order:   Diet Order    None      EDUCATION NEEDS:   No education needs have been identified at this time  Skin:  Skin Assessment: Reviewed RN Assessment  Last BM:  08/28/19  Height:   Ht Readings from Last 1 Encounters:  No data found for Ht    Weight:   Wt Readings from Last 1 Encounters:  08/29/19 61.3 kg    Ideal Body Weight:  (no ht available to assess at this time)  BMI:  There is no height or weight on file to calculate BMI.  Estimated Nutritional Needs:   Kcal:  2150-2350  Protein:  110-125 grams  Fluid:  > 2.2 L    Archita Lomeli A. Mayford Knife, RD, LDN, CDCES Registered Dietitian II Certified Diabetes Care and Education Specialist Pager: [phone] After hours Pager: [phone]

## 2019-08-29 NOTE — Progress Notes (Addendum)
NEURO HOSPITALIST PROGRESS NOTE   Subjective: Patient in bed asleep in bilateral mitten restraints, NAD.  Exam: Vitals:   08/29/19 0900 08/29/19 1000  BP:    Pulse: 70 99  Resp:    Temp:    SpO2:      Physical Exam  Constitutional: Appears well-developed and well-nourished.  Psych: Affect appropriate to situation Eyes: Normal external eye and conjunctiva. HENT: Normocephalic, no lesions, without obvious abnormality.   Musculoskeletal-no joint tenderness, deformity or swelling Cardiovascular: Normal rate and regular rhythm.  Respiratory: Effort normal, non-labored breathing saturations WNL GI: Soft.  No distension. There is no tenderness.  Skin: WDI   Neuro:  Mental Status:  oriented to name, thought content appropriate.  Patient speech is mumbles/garbled, but you can understand when he says his name.able to follow simple commands Cranial Nerves: II: blinks to threat inconsistently. III,IV, VI: ptosis not present, extra-ocular motions intact bilaterally pupils equal, round, reactive to light  V,VII: smile symmetric, facial light touch sensation normal bilaterally VIII: hearing normal bilaterally IX,X: uvula rises symmetrically XI: bilateral shoulder shrug XII: midline tongue extension Motor: Able to move all 4 extremities anti-gravity Tone and bulk:normal tone throughout; no atrophy noted Sensory:  light touch intact throughout, bilaterally Cerebellar: Unable to test Gait: deferred    Medications:  Scheduled: . folic acid  1 mg Oral Daily  . LORazepam  0-4 mg Intravenous Q6H   Followed by  . [START ON 08/30/2019] LORazepam  0-4 mg Intravenous Q12H  . multivitamin with minerals  1 tablet Oral Daily  . thiamine  100 mg Oral Daily   Or  . thiamine  100 mg Intravenous Daily   Continuous: . dextrose 5 % and 0.45 % NaCl with KCl 10 mEq/L 125 mL/hr at 08/29/19 0212  . folic acid (FOLVITE) IVPB    . vancomycin 1,000 mg (08/29/19 0507)    JJK:[id number] **OR** acetaminophen (TYLENOL) oral liquid 160 mg/5 mL **OR** acetaminophen, LORazepam **OR** LORazepam  Pertinent Labs/Diagnostics:  Ammonia : 45 TSH:  WNL B1: pending  rEEG 08/28/2019  IMPRESSION: This study is suggestive of mild diffuse encephalopathy, non specific to etiology. No seizures or epileptiform discharges were seen throughout the recording.  The excessive beta activity seen in the background is most likely due to the effect of benzodiazepine and is a benign EEG pattern.  Ct Chest W Contrast  Result Date: 08/28/2019 CLINICAL DATA:  68 year old male with fever and leukocytosis. EXAM: CT CHEST, ABDOMEN, AND PELVIS WITH CONTRAST TECHNIQUE: Multidetector CT imaging of the chest, abdomen and pelvis was performed following the standard protocol during bolus administration of intravenous contrast. CONTRAST:  OMNIPAQUE IOHEXOL 300 MG/ML  SOLN COMPARISON:  Chest radiograph dated 08/22/2019. FINDINGS: CT CHEST FINDINGS Cardiovascular: There is no cardiomegaly or pericardial effusion. Calcification of the mitral annulus noted. There is mild atherosclerotic calcification of the thoracic aorta. No aneurysmal dilatation or dissection. The central pulmonary arteries are unremarkable as visualized. Mediastinum/Nodes: No hilar or mediastinal adenopathy. The esophagus and the thyroid gland are grossly unremarkable. No mediastinal fluid collection. Lungs/Pleura: Trace left pleural effusion. Scattered clusters of ground-glass and nodular opacities with central cavitation primarily involving the upper lobes noted. A patchy area of airspace opacity is noted in the right upper lobe with areas of cavitation. Findings may represent fungal infection, abscesses, TB, or septic emboli. Other etiologies are not excluded. Clinical correlation is recommended. There is no  pneumothorax. Mucus content noted in the distal trachea extending into the left mainstem bronchus. The central airways  however remain patent. Musculoskeletal: No chest wall mass or suspicious bone lesions identified. CT ABDOMEN PELVIS FINDINGS No intra-abdominal free air or free fluid. Hepatobiliary: The liver is unremarkable as visualized. No intrahepatic biliary ductal dilatation. The gallbladder is unremarkable. Pancreas: Unremarkable. No pancreatic ductal dilatation or surrounding inflammatory changes. Spleen: Normal in size without focal abnormality. Adrenals/Urinary Tract: The adrenal glands are unremarkable. There is no hydronephrosis on either side. There is symmetric enhancement and excretion of contrast by both kidneys. Slightly striated enhancement pattern of the renal parenchyma may be artifactual. Correlation with urinalysis recommended to exclude pyelonephritis. A subcentimeter left renal hypodense lesion is too small to characterize. The visualized ureters appear unremarkable. The urinary bladder is predominantly collapsed. There is apparent diffuse thickening of the bladder wall which may be partly related to underdistention. Cystitis is not excluded. Correlation with urinalysis recommended. Stomach/Bowel: A rectal tube and contrast noted throughout the colon. There is no bowel obstruction or active inflammation. There are scattered sigmoid diverticula without active inflammation. The appendix is normal. Vascular/Lymphatic: Mild aortoiliac atherosclerotic disease. The IVC is unremarkable. No portal venous gas. There is no adenopathy. Reproductive: The prostate and seminal vesicles are grossly unremarkable. Other: There is intramuscular hematoma involving the left psoas muscle extending to the left iliacus muscle. The hematoma in the left psoas muscle measures approximately 6.5 x 5.3 cm in greatest axial dimensions and 10 cm in craniocaudal length. There is linear contrast blushing within the left psoas muscle suggestive of active bleed. There is a 3.7 x 3.5 cm low attenuating/cystic structure in the region of the  left inguinal canal which may represent a seroma or an old hematoma, or possibly a lymphocele. A necrotic mass or lymph node is favored less likely but not excluded. Correlation with clinical exam and follow-up recommended. Ultrasound may provide better evaluation of this structure. Musculoskeletal: Degenerative changes of the spine. No acute osseous pathology. IMPRESSION: 1. Left psoas intramuscular hematoma with evidence of small active bleed. 2. No bowel obstruction or active inflammation. Normal appendix. 3. Sigmoid diverticulosis. 4. Bilateral pulmonary densities with pattern concerning for fungal infection, abscesses, TB, or septic emboli. Other etiologies are not excluded. Clinical correlation is recommended. Trace left pleural effusion. 5. Slight heterogeneous nephrogram may be artifactual or represent pyelonephritis. Correlation with urinalysis recommended. 6. Aortic Atherosclerosis (ICD10-I70.0). These results were called by telephone at the time of interpretation on 08/28/2019 at 1:45 pm to [facility] , who verbally acknowledged these results. Electronically Signed   By: Elgie CollardArash  Radparvar M.D.   On: 08/28/2019 13:51   Ct Abdomen Pelvis W Contrast  Result Date: 08/28/2019 CLINICAL DATA:  68 year old male with fever and leukocytosis. EXAM: CT CHEST, ABDOMEN, AND PELVIS WITH CONTRAST TECHNIQUE: Multidetector CT imaging of the chest, abdomen and pelvis was performed following the standard protocol during bolus administration of intravenous contrast. CONTRAST:  100mL OMNIPAQUE IOHEXOL 300 MG/ML  SOLN COMPARISON:  Chest radiograph dated 08/22/2019. FINDINGS: CT CHEST FINDINGS Cardiovascular: There is no cardiomegaly or pericardial effusion. Calcification of the mitral annulus noted. There is mild atherosclerotic calcification of the thoracic aorta. No aneurysmal dilatation or dissection. The central pulmonary arteries are unremarkable as visualized. Mediastinum/Nodes: No hilar or mediastinal  adenopathy. The esophagus and the thyroid gland are grossly unremarkable. No mediastinal fluid collection. Lungs/Pleura: Trace left pleural effusion. Scattered clusters of ground-glass and nodular opacities with central cavitation primarily involving the upper lobes noted. A  patchy area of airspace opacity is noted in the right upper lobe with areas of cavitation. Findings may represent fungal infection, abscesses, TB, or septic emboli. Other etiologies are not excluded. Clinical correlation is recommended. There is no pneumothorax. Mucus content noted in the distal trachea extending into the left mainstem bronchus. The central airways however remain patent. Musculoskeletal: No chest wall mass or suspicious bone lesions identified. CT ABDOMEN PELVIS FINDINGS No intra-abdominal free air or free fluid. Hepatobiliary: The liver is unremarkable as visualized. No intrahepatic biliary ductal dilatation. The gallbladder is unremarkable. Pancreas: Unremarkable. No pancreatic ductal dilatation or surrounding inflammatory changes. Spleen: Normal in size without focal abnormality. Adrenals/Urinary Tract: The adrenal glands are unremarkable. There is no hydronephrosis on either side. There is symmetric enhancement and excretion of contrast by both kidneys. Slightly striated enhancement pattern of the renal parenchyma may be artifactual. Correlation with urinalysis recommended to exclude pyelonephritis. A subcentimeter left renal hypodense lesion is too small to characterize. The visualized ureters appear unremarkable. The urinary bladder is predominantly collapsed. There is apparent diffuse thickening of the bladder wall which may be partly related to underdistention. Cystitis is not excluded. Correlation with urinalysis recommended. Stomach/Bowel: A rectal tube and contrast noted throughout the colon. There is no bowel obstruction or active inflammation. There are scattered sigmoid diverticula without active inflammation. The  appendix is normal. Vascular/Lymphatic: Mild aortoiliac atherosclerotic disease. The IVC is unremarkable. No portal venous gas. There is no adenopathy. Reproductive: The prostate and seminal vesicles are grossly unremarkable. Other: There is intramuscular hematoma involving the left psoas muscle extending to the left iliacus muscle. The hematoma in the left psoas muscle measures approximately 6.5 x 5.3 cm in greatest axial dimensions and 10 cm in craniocaudal length. There is linear contrast blushing within the left psoas muscle suggestive of active bleed. There is a 3.7 x 3.5 cm low attenuating/cystic structure in the region of the left inguinal canal which may represent a seroma or an old hematoma, or possibly a lymphocele. A necrotic mass or lymph node is favored less likely but not excluded. Correlation with clinical exam and follow-up recommended. Ultrasound may provide better evaluation of this structure. Musculoskeletal: Degenerative changes of the spine. No acute osseous pathology. IMPRESSION: 1. Left psoas intramuscular hematoma with evidence of small active bleed. 2. No bowel obstruction or active inflammation. Normal appendix. 3. Sigmoid diverticulosis. 4. Bilateral pulmonary densities with pattern concerning for fungal infection, abscesses, TB, or septic emboli. Other etiologies are not excluded. Clinical correlation is recommended. Trace left pleural effusion. 5. Slight heterogeneous nephrogram may be artifactual or represent pyelonephritis. Correlation with urinalysis recommended. 6. Aortic Atherosclerosis (ICD10-I70.0). These results were called by telephone at the time of interpretation on 08/28/2019 at 1:45 pm to provider [facility] , who verbally acknowledged these results. Electronically Signed   By: Anner Crete M.D.   On: 08/28/2019 13:51   Impression: 68 yo M with AMS in the setting of MRSA bacteremia.  It was suspected that his presentation represents  persistent septic  encephalopathy vs meningitis.An LP wouldnt really change management much given that he is already on treatment for MRSA bacteremia with vancomycin at a dose that would be adequate coverage. Wernicke's encephalopathy would be another consideration given history of alcohol abuse, was started on thiamine. B1 level was sent and is pending. Suspicion for this is not terribly high but he will receive standard repletion. Low suspicion that stroke is playing much role given the description, but the presence of a cerebellar infarct on his  MRI is highly suspicious for possible embolic disease ( images not reviewed).    EEG  Did not show seizures or epileptiform discharges.  Recommendations: - continue vancomycin -obtain MRI with/without contrast   Other active problems: Left psoas IM hematoma with evidence of small active bleed (  lovenox was stopped). Sigmoid diverticulosis, bilateral pulmonary densities.    Valentina Lucks, MSN, NP-C [facility] [phone]  Attending neurologist's note to follow    08/29/2019, 10:55 AM    NEUROHOSPITALIST ADDENDUM Performed a face to face diagnostic evaluation.   I have reviewed the contents of history and physical exam as documented by PA/ARNP/Resident and agree with above documentation.  I have discussed and formulated the above plan as documented. Edits to the note have been made as needed.  Patient continues to remain significantly encephalopathic, EEG does not show any epileptiform discharges so suspicion for seizures is low.  Suspect this is due to sepsis related encephalopathy, however meningitis cannot be ruled out.  Agree with Dr. Amada Jupiter that yield of LP would be low given patient is already on antibiotics, I would not change management except possibly duration of antibiotic coverage.  I do think we should obtain MRI brain with and without contrast to make sure he has not had any new embolic events.  Agree with holding  antiplatelets in the setting of infection.         Georgiana Spinner Airel Magadan MD [facility] [phone]   If 7pm to 7am, please call on call as listed on AMION.

## 2019-08-29 NOTE — Evaluation (Signed)
Clinical/Bedside Swallow Evaluation Patient Details  Name: Ryan Lane MRN: [id number] Date of Birth: 07-03-1951  Today's Date: 08/29/2019 Time: SLP Start Time (ACUTE ONLY): 1018 SLP Stop Time (ACUTE ONLY): 1051 SLP Time Calculation (min) (ACUTE ONLY): 33 min  Past Medical History: History reviewed. No pertinent past medical history. Past Surgical History: History reviewed. No pertinent surgical history. HPI:  Pt is a 68 y.o. M with significant PMH of alcohol and polysubstance abuse who presents to ER with fever, chills, body aches and diarrhea. Admitted with pneumonia/bacteremia with MRSA, transferred to [facility] on 08/28/2019 for further treatment and possible TEE. CT chest/abd/pelvis showing left psoas intramuscular hematoma with evidence of small active bleed. 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    Assessment / Plan / Recommendation Clinical Impression  Pt lethargic, but was sufficiently alert to participate in limited swallowing assessment.  Aggressive oral care was provided, removing thick, dried secretions tethering base-of-tongue to palate.  After oral care, speech was marginally better, although quite hypernasal in resonance.  (Pt able to state name and requested  be removed.  Speech was approx 60% intelligible.) Pt consumed limited ice chips and teaspoons of water, which elicited intermittent coughing, however, pt was still likely clearing loosened secretions from his  pharynx. Recommend continuing to provide frequent oral care today; allow 5-10 ice chips per hour to assist with mobilization of secretions and to encourage active swallowing.  SLP will f/u next date to assess readiness for PO diet vs instrumental swallow study.  If mentation does not improve, may need to consider short-term cortrak if warranted in the context of pending Palliative medicine consult.   SLP Visit Diagnosis: Dysphagia, oropharyngeal phase (R13.12)    Aspiration Risk  Moderate  aspiration risk    Diet Recommendation   npo except ice chips       Other  Recommendations Oral Care Recommendations: Oral care QID;Oral care prior to ice chip/H20   Follow up Recommendations (tba)      Frequency and Duration min 3x week  2 weeks       Prognosis Prognosis for Safe Diet Advancement: Fair      Swallow Study   General Date of Onset: 08/28/19 HPI: Pt is a 68 y.o. M with significant PMH of alcohol and polysubstance abuse who presents to ER with fever, chills, body aches and diarrhea. Admitted with pneumonia/bacteremia with MRSA, transferred to [facility] on 08/28/2019 for further treatment and possible TEE. CT chest/abd/pelvis showing left psoas intramuscular hematoma with evidence of small active bleed. 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  Type of Study: Bedside Swallow Evaluation Previous Swallow Assessment: none per records Diet Prior to this Study: NPO Temperature Spikes Noted: No Respiratory Status: Nasal cannula(4 liters) History of Recent Intubation: No Behavior/Cognition: Lethargic/Drowsy Oral Cavity Assessment: Dried secretions;Excessive secretions Oral Care Completed by SLP: Yes Oral Cavity - Dentition: Edentulous Vision: Impaired for self-feeding Self-Feeding Abilities: Total assist Patient Positioning: Upright in bed Baseline Vocal Quality: Normal Volitional Cough: Weak Volitional Swallow: Able to elicit    Oral/Motor/Sensory Function Overall Oral Motor/Sensory Function: (difficult to assess; tongue is midline)   Ice Chips Ice chips: Impaired Presentation: Spoon Oral Phase Functional Implications: Oral holding Pharyngeal Phase Impairments: Cough - Delayed   Thin Liquid Thin Liquid: Impaired Presentation: Spoon Oral Phase Functional Implications: Oral holding Pharyngeal  Phase Impairments: Suspected delayed Swallow;Cough - Delayed    Nectar Thick Nectar Thick Liquid: Not tested   Honey Thick Honey Thick Liquid: Not tested  Puree Puree: Not tested   Solid     Solid: Not tested      Juan Quam Laurice 08/29/2019,11:04 AM   Estill Bamberg L. Tivis Ringer, [place] Office number [phone] Pager [phone]

## 2019-08-29 NOTE — Evaluation (Signed)
Physical Therapy Evaluation Patient Details Name: Ryan Lane MRN: [id number] DOB: [birth year] Today's Date: 08/29/2019   History of Present Illness  Pt is a 68 y.o. M with significant PMH of alcohol and polysubstance abuse who presents to ER with fever, chills, body aches and diarrhea. Admitted with pneumonia/bacteremia with MRSA, transferred to [facility] on 08/28/2019 for further treatment and possible TEE. CT chest/abd/pelvis showing left psoas intramuscular hematoma with evidence of small active bleed. 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   Clinical Impression  Pt admitted with above. Evaluation limited by lethargy; pt following < 50% of 1 step motor commands. Pt presents with decreased functional mobility secondary to right sided weakness, decreased cervical and right upper extremity range of motion, poor sitting balance, decreased cognition. Pt requiring two person total assist for bed mobility. BP 142/70, HR 127-143 bpm, SpO2 91-98% on 4L O2. Suspect pt will require less assist with improved mentation. Will likely need post acute rehab at discharge.     Follow Up Recommendations SNF;Supervision/Assistance - 24 hour    Equipment Recommendations  Other (comment)(TBA)    Recommendations for Other Services       Precautions / Restrictions Precautions Precautions: Fall Restrictions Weight Bearing Restrictions: No      Mobility  Bed Mobility Overal bed mobility: Needs Assistance Bed Mobility: Supine to Sit;Sit to Supine     Supine to sit: Total assist;+2 for physical assistance Sit to supine: Total assist;+2 for physical assistance   General bed mobility comments: TotalA + 2 for bed mobility, limited by lethargy  Transfers                 General transfer comment: unable  Ambulation/Gait                Stairs            Wheelchair Mobility    Modified Rankin (Stroke Patients Only) Modified Rankin (Stroke Patients  Only) Pre-Morbid Rankin Score: No symptoms Modified Rankin: Severe disability     Balance Overall balance assessment: Needs assistance Sitting-balance support: Feet supported Sitting balance-Leahy Scale: Poor Sitting balance - Comments: Increased cervical and trunk flexion in addition to left lateral lean. Decreased balance reactions likely due to lethargy; requiring mod-totalA Postural control: Left lateral lean                                   Pertinent Vitals/Pain Pain Assessment: Faces Faces Pain Scale: Hurts little more Pain Location: grimacing with cervical stretching/movement Pain Descriptors / Indicators: Grimacing Pain Intervention(s): Monitored during session;Repositioned    Home Living Family/patient expects to be discharged to:: Unsure                 Additional Comments: Pt unable to provide; per chart review, estranged from 2 sons and sister and nephew are primary contact    Prior Function Level of Independence: Independent         Comments: Assume independent; pt unable to confirm     Hand Dominance        Extremity/Trunk Assessment   Upper Extremity Assessment Upper Extremity Assessment: Defer to OT evaluation    Lower Extremity Assessment Lower Extremity Assessment: RLE deficits/detail;LLE deficits/detail RLE Deficits / Details: Grossly 2-/5, able to perform limited hip flexion and LAQ seated on edge of bed LLE Deficits / Details: Moving spontaneously    Cervical / Trunk Assessment Cervical / Trunk Assessment: Kyphotic;Other exceptions  Cervical / Trunk Exceptions: forward head posture  Communication   Communication: Expressive difficulties(garbled, unintelligible speech)  Cognition Arousal/Alertness: Lethargic Behavior During Therapy: Flat affect Overall Cognitive Status: Impaired/Different from baseline Area of Impairment: Following commands;Awareness                       Following Commands: Follows one  step commands inconsistently   Awareness: Intellectual   General Comments: Pt lethargic, keeping eyes closed through majority of evaluation. Following < 50% of commands intermittently. Garbled, unintelligible speech, unable to nod yes/no in response to questions.       General Comments      Exercises     Assessment/Plan    PT Assessment Patient needs continued PT services  PT Problem List Decreased strength;Decreased range of motion;Decreased activity tolerance;Decreased balance;Decreased mobility;Decreased cognition;Decreased safety awareness;Cardiopulmonary status limiting activity;Pain       PT Treatment Interventions DME instruction;Gait training;Functional mobility training;Therapeutic activities;Therapeutic exercise;Balance training;Neuromuscular re-education;Cognitive remediation;Patient/family education    PT Goals (Current goals can be found in the Care Plan section)  Acute Rehab PT Goals Patient Stated Goal: unable PT Goal Formulation: Patient unable to participate in goal setting Time For Goal Achievement: 09/12/19 Potential to Achieve Goals: Fair    Frequency Min 3X/week   Barriers to discharge        Co-evaluation PT/OT/SLP Co-Evaluation/Treatment: Yes Reason for Co-Treatment: Complexity of the patient's impairments (multi-system involvement);Necessary to address cognition/behavior during functional activity;For patient/therapist safety;To address functional/ADL transfers PT goals addressed during session: Mobility/safety with mobility         AM-PAC PT "6 Clicks" Mobility  Outcome Measure Help needed turning from your back to your side while in a flat bed without using bedrails?: Total Help needed moving from lying on your back to sitting on the side of a flat bed without using bedrails?: Total Help needed moving to and from a bed to a chair (including a wheelchair)?: Total Help needed standing up from a chair using your arms (e.g., wheelchair or bedside  chair)?: Total Help needed to walk in hospital room?: Total Help needed climbing 3-5 steps with a railing? : Total 6 Click Score: 6    End of Session   Activity Tolerance: Patient limited by lethargy Patient left: in bed;with call bell/phone within reach;with bed alarm set Nurse Communication: Mobility status PT Visit Diagnosis: Other abnormalities of gait and mobility (R26.89)    Time: 2951-8841 PT Time Calculation (min) (ACUTE ONLY): 22 min   Charges:   PT Evaluation $PT Eval Moderate Complexity: 1 Mod          Ellamae Sia, PT, DPT Acute Rehabilitation Services Pager [phone] Office [phone]   Willy Eddy 08/29/2019, 9:06 AM

## 2019-08-30 ENCOUNTER — Inpatient Hospital Stay (HOSPITAL_COMMUNITY): Payer: Medicare Other

## 2019-08-30 ENCOUNTER — Other Ambulatory Visit (HOSPITAL_COMMUNITY): Payer: Medicare Other

## 2019-08-30 LAB — BLOOD CULTURE ID PANEL (REFLEXED)

## 2019-08-30 LAB — CBC
HCT: 34.3 % — ABNORMAL LOW (ref 39.0–52.0)
Hemoglobin: 11.8 g/dL — ABNORMAL LOW (ref 13.0–17.0)
MCH: 33.3 pg (ref 26.0–34.0)
MCHC: 34.4 g/dL (ref 30.0–36.0)
MCV: 96.9 fL (ref 80.0–100.0)
Platelets: 204 10*3/uL (ref 150–400)
RBC: 3.54 MIL/uL — ABNORMAL LOW (ref 4.22–5.81)
RDW: 13.7 % (ref 11.5–15.5)
WBC: 24.8 10*3/uL — ABNORMAL HIGH (ref 4.0–10.5)
nRBC: 0 % (ref 0.0–0.2)

## 2019-08-30 LAB — GLUCOSE, CAPILLARY
Glucose-Capillary: 102 mg/dL — ABNORMAL HIGH (ref 70–99)
Glucose-Capillary: 104 mg/dL — ABNORMAL HIGH (ref 70–99)
Glucose-Capillary: 119 mg/dL — ABNORMAL HIGH (ref 70–99)
Glucose-Capillary: 137 mg/dL — ABNORMAL HIGH (ref 70–99)
Glucose-Capillary: 140 mg/dL — ABNORMAL HIGH (ref 70–99)
Glucose-Capillary: 145 mg/dL — ABNORMAL HIGH (ref 70–99)

## 2019-08-30 LAB — VANCOMYCIN, PEAK: Vancomycin Pk: 27 ug/mL — ABNORMAL LOW (ref 30–40)

## 2019-08-30 NOTE — Progress Notes (Addendum)
NEURO HOSPITALIST PROGRESS NOTE   Subjective: Patient in bed, asleep. NGT in place. 2L [place], NAD. Still in bilateral mitten restraints.  Exam: Vitals:   08/30/19 0403 08/30/19 0755  BP: 140/70 (!) 149/79  Pulse: (!) 126 (!) 122  Resp:  (!) 33  Temp:  (!) 100.7 F (38.2 C)  SpO2:  99%    Physical Exam  Constitutional: Appears well-developed and well-nourished.  Psych: Affect appropriate to situation Eyes: Normal external eye and conjunctiva. HENT: Normocephalic, no lesions, without obvious abnormality.   Musculoskeletal-no joint tenderness, deformity or swelling Cardiovascular: Normal rate and regular rhythm.  Respiratory: Effort normal, non-labored breathing saturations WNL GI: Soft.  No distension. There is no tenderness.  Skin: WDI   Neuro:  Mental Status:  seems slightly more alert today, though still drowsy.oriented to name, thought content appropriate.  Patient speech is mumbles/garbled, but you can understand when he says his name.able to follow simple commands Cranial Nerves: II: blinks to threat inconsistently. III,IV, VI: ptosis not present, extra-ocular motions intact bilaterally pupils equal, round, reactive to light  V,VII: smile symmetric, facial light touch sensation normal bilaterally VIII: hearing normal bilaterally IX,X: uvula rises symmetrically XI: bilateral shoulder shrug XII: midline tongue extension Motor: Able to move all 4 extremities anti-gravity Tone and bulk:normal tone throughout; no atrophy noted Sensory:  light touch intact throughout, bilaterally Cerebellar: Unable to test Gait: deferred    Medications:  Scheduled: . feeding supplement (OSMOLITE 1.5 CAL)  1,000 mL Per Tube Q24H  . feeding supplement (PRO-STAT SUGAR FREE 64)  30 mL Per Tube BID  . folic acid  1 mg Oral Daily  . LORazepam  0-4 mg Intravenous Q12H  . multivitamin with minerals  1 tablet Per Tube Daily  . thiamine  100 mg Oral Daily   Or  .  thiamine  100 mg Intravenous Daily   Continuous: . dextrose 5 % and 0.45 % NaCl with KCl 10 mEq/L 125 mL/hr at 08/30/19 0312  . folic acid (FOLVITE) IVPB 1 mg (08/30/19 1016)  . vancomycin 1,000 mg (08/30/19 0313)   ONG:[id number] **OR** acetaminophen (TYLENOL) oral liquid 160 mg/5 mL **OR** acetaminophen, LORazepam **OR** LORazepam  Pertinent Labs/Diagnostics:  No pertinent labs today  rEEG 08/28/2019  IMPRESSION: This study is suggestive of mild diffuse encephalopathy, non specific to etiology. No seizures or epileptiform discharges were seen throughout the recording.  The excessive beta activity seen in the background is most likely due to the effect of benzodiazepine and is a benign EEG pattern.  Ct Chest W Contrast  Result Date: 08/28/2019 CLINICAL DATA:  68 year old male with fever and leukocytosis. EXAM: CT CHEST, ABDOMEN, AND PELVIS WITH CONTRAST TECHNIQUE: Multidetector CT imaging of the chest, abdomen and pelvis was performed following the standard protocol during bolus administration of intravenous contrast. CONTRAST:  162mL OMNIPAQUE IOHEXOL 300 MG/ML  SOLN COMPARISON:  Chest radiograph dated 08/22/2019. FINDINGS: CT CHEST FINDINGS Cardiovascular: There is no cardiomegaly or pericardial effusion. Calcification of the mitral annulus noted. There is mild atherosclerotic calcification of the thoracic aorta. No aneurysmal dilatation or dissection. The central pulmonary arteries are unremarkable as visualized. Mediastinum/Nodes: No hilar or mediastinal adenopathy. The esophagus and the thyroid gland are grossly unremarkable. No mediastinal fluid collection. Lungs/Pleura: Trace left pleural effusion. Scattered clusters of ground-glass and nodular opacities with central cavitation primarily involving the upper lobes noted. A patchy area of airspace opacity is  noted in the right upper lobe with areas of cavitation. Findings may represent fungal infection, abscesses, TB, or septic  emboli. Other etiologies are not excluded. Clinical correlation is recommended. There is no pneumothorax. Mucus content noted in the distal trachea extending into the left mainstem bronchus. The central airways however remain patent. Musculoskeletal: No chest wall mass or suspicious bone lesions identified. CT ABDOMEN PELVIS FINDINGS No intra-abdominal free air or free fluid. Hepatobiliary: The liver is unremarkable as visualized. No intrahepatic biliary ductal dilatation. The gallbladder is unremarkable. Pancreas: Unremarkable. No pancreatic ductal dilatation or surrounding inflammatory changes. Spleen: Normal in size without focal abnormality. Adrenals/Urinary Tract: The adrenal glands are unremarkable. There is no hydronephrosis on either side. There is symmetric enhancement and excretion of contrast by both kidneys. Slightly striated enhancement pattern of the renal parenchyma may be artifactual. Correlation with urinalysis recommended to exclude pyelonephritis. A subcentimeter left renal hypodense lesion is too small to characterize. The visualized ureters appear unremarkable. The urinary bladder is predominantly collapsed. There is apparent diffuse thickening of the bladder wall which may be partly related to underdistention. Cystitis is not excluded. Correlation with urinalysis recommended. Stomach/Bowel: A rectal tube and contrast noted throughout the colon. There is no bowel obstruction or active inflammation. There are scattered sigmoid diverticula without active inflammation. The appendix is normal. Vascular/Lymphatic: Mild aortoiliac atherosclerotic disease. The IVC is unremarkable. No portal venous gas. There is no adenopathy. Reproductive: The prostate and seminal vesicles are grossly unremarkable. Other: There is intramuscular hematoma involving the left psoas muscle extending to the left iliacus muscle. The hematoma in the left psoas muscle measures approximately 6.5 x 5.3 cm in greatest axial  dimensions and 10 cm in craniocaudal length. There is linear contrast blushing within the left psoas muscle suggestive of active bleed. There is a 3.7 x 3.5 cm low attenuating/cystic structure in the region of the left inguinal canal which may represent a seroma or an old hematoma, or possibly a lymphocele. A necrotic mass or lymph node is favored less likely but not excluded. Correlation with clinical exam and follow-up recommended. Ultrasound may provide better evaluation of this structure. Musculoskeletal: Degenerative changes of the spine. No acute osseous pathology. IMPRESSION: 1. Left psoas intramuscular hematoma with evidence of small active bleed. 2. No bowel obstruction or active inflammation. Normal appendix. 3. Sigmoid diverticulosis. 4. Bilateral pulmonary densities with pattern concerning for fungal infection, abscesses, TB, or septic emboli. Other etiologies are not excluded. Clinical correlation is recommended. Trace left pleural effusion. 5. Slight heterogeneous nephrogram may be artifactual or represent pyelonephritis. Correlation with urinalysis recommended. 6. Aortic Atherosclerosis (ICD10-I70.0). These results were called by telephone at the time of interpretation on 08/28/2019 at 1:45 pm to provider [facility] , who verbally acknowledged these results. Electronically Signed   By: Elgie Collard M.D.   On: 08/28/2019 13:51   Ct Abdomen Pelvis W Contrast  Result Date: 08/28/2019 CLINICAL DATA:  68 year old male with fever and leukocytosis. EXAM: CT CHEST, ABDOMEN, AND PELVIS WITH CONTRAST TECHNIQUE: Multidetector CT imaging of the chest, abdomen and pelvis was performed following the standard protocol during bolus administration of intravenous contrast. CONTRAST:  OMNIPAQUE IOHEXOL 300 MG/ML  SOLN COMPARISON:  Chest radiograph dated 08/22/2019. FINDINGS: CT CHEST FINDINGS Cardiovascular: There is no cardiomegaly or pericardial effusion. Calcification of the mitral annulus noted.  There is mild atherosclerotic calcification of the thoracic aorta. No aneurysmal dilatation or dissection. The central pulmonary arteries are unremarkable as visualized. Mediastinum/Nodes: No hilar or mediastinal adenopathy. The esophagus and  the thyroid gland are grossly unremarkable. No mediastinal fluid collection. Lungs/Pleura: Trace left pleural effusion. Scattered clusters of ground-glass and nodular opacities with central cavitation primarily involving the upper lobes noted. A patchy area of airspace opacity is noted in the right upper lobe with areas of cavitation. Findings may represent fungal infection, abscesses, TB, or septic emboli. Other etiologies are not excluded. Clinical correlation is recommended. There is no pneumothorax. Mucus content noted in the distal trachea extending into the left mainstem bronchus. The central airways however remain patent. Musculoskeletal: No chest wall mass or suspicious bone lesions identified. CT ABDOMEN PELVIS FINDINGS No intra-abdominal free air or free fluid. Hepatobiliary: The liver is unremarkable as visualized. No intrahepatic biliary ductal dilatation. The gallbladder is unremarkable. Pancreas: Unremarkable. No pancreatic ductal dilatation or surrounding inflammatory changes. Spleen: Normal in size without focal abnormality. Adrenals/Urinary Tract: The adrenal glands are unremarkable. There is no hydronephrosis on either side. There is symmetric enhancement and excretion of contrast by both kidneys. Slightly striated enhancement pattern of the renal parenchyma may be artifactual. Correlation with urinalysis recommended to exclude pyelonephritis. A subcentimeter left renal hypodense lesion is too small to characterize. The visualized ureters appear unremarkable. The urinary bladder is predominantly collapsed. There is apparent diffuse thickening of the bladder wall which may be partly related to underdistention. Cystitis is not excluded. Correlation with  urinalysis recommended. Stomach/Bowel: A rectal tube and contrast noted throughout the colon. There is no bowel obstruction or active inflammation. There are scattered sigmoid diverticula without active inflammation. The appendix is normal. Vascular/Lymphatic: Mild aortoiliac atherosclerotic disease. The IVC is unremarkable. No portal venous gas. There is no adenopathy. Reproductive: The prostate and seminal vesicles are grossly unremarkable. Other: There is intramuscular hematoma involving the left psoas muscle extending to the left iliacus muscle. The hematoma in the left psoas muscle measures approximately 6.5 x 5.3 cm in greatest axial dimensions and 10 cm in craniocaudal length. There is linear contrast blushing within the left psoas muscle suggestive of active bleed. There is a 3.7 x 3.5 cm low attenuating/cystic structure in the region of the left inguinal canal which may represent a seroma or an old hematoma, or possibly a lymphocele. A necrotic mass or lymph node is favored less likely but not excluded. Correlation with clinical exam and follow-up recommended. Ultrasound may provide better evaluation of this structure. Musculoskeletal: Degenerative changes of the spine. No acute osseous pathology. IMPRESSION: 1. Left psoas intramuscular hematoma with evidence of small active bleed. 2. No bowel obstruction or active inflammation. Normal appendix. 3. Sigmoid diverticulosis. 4. Bilateral pulmonary densities with pattern concerning for fungal infection, abscesses, TB, or septic emboli. Other etiologies are not excluded. Clinical correlation is recommended. Trace left pleural effusion. 5. Slight heterogeneous nephrogram may be artifactual or represent pyelonephritis. Correlation with urinalysis recommended. 6. Aortic Atherosclerosis (ICD10-I70.0). These results were called by telephone at the time of interpretation on 08/28/2019 at 1:45 pm to provider [facility] , who verbally acknowledged these results.  Electronically Signed   By: Elgie Collard M.D.   On: 08/28/2019 13:51   Impression: 68 yo M with AMS in the setting of MRSA bacteremia.  It was suspected that his presentation represents  persistent septic encephalopathy vs meningitis.An LP wouldnt really change management much given that he is already on treatment for MRSA bacteremia with vancomycin at a dose that would be adequate coverage. Wernicke's encephalopathy would be another consideration given history of alcohol abuse, was started on thiamine. B1 level was sent and is pending. Suspicion  for this is not terribly high but he will receive standard repletion. Low suspicion that stroke is playing much role given the description, but the presence of a cerebellar infarct on his MRI is highly suspicious for possible embolic disease ( images not reviewed).    EEG  Did not show seizures or epileptiform discharges.  Recommendations: - continue vancomycin -obtain MRI with/without contrast   Other active problems: Left psoas IM hematoma with evidence of small active bleed (  lovenox was stopped). Sigmoid diverticulosis, bilateral pulmonary densities.    Valentina LucksJessica Williams, MSN, NP-C [facility] [phone]  Attending neurologist's note to follow    08/30/2019, 11:24 AM   NEUROHOSPITALIST ADDENDUM Performed a face to face diagnostic evaluation.   I have reviewed the contents of history and physical exam as documented by PA/ARNP/Resident and agree with above documentation.  I have discussed and formulated the above plan as documented. Edits to the note have been made as needed.  Encephalopathy continues to improve, aware of month and year.  He could state his age and date of birth.  Was aware of collection results and stated Biden when asked president.  Moves all 4 extremities antigravity.  Repeat MRI brain still pending   Elmus Mathes MD [facility] [phone]   If 7pm to 7am, please call on call  as listed on AMION.

## 2019-08-30 NOTE — Plan of Care (Signed)
  Problem: Education: Goal: Knowledge of disease or condition will improve Outcome: Progressing Goal: Knowledge of secondary prevention will improve Outcome: Progressing Goal: Knowledge of patient specific risk factors addressed and post discharge goals established will improve Outcome: Progressing Goal: Individualized Educational Video(s) Outcome: Progressing   Problem: Coping: Goal: Will verbalize positive feelings about self Outcome: Progressing Goal: Will identify appropriate support needs Outcome: Progressing   Problem: Health Behavior/Discharge Planning: Goal: Ability to manage health-related needs will improve Outcome: Progressing   Problem: Self-Care: Goal: Ability to participate in self-care as condition permits will improve Outcome: Progressing Goal: Verbalization of feelings and concerns over difficulty with self-care will improve Outcome: Progressing Goal: Ability to communicate needs accurately will improve Outcome: Progressing   Problem: Nutrition: Goal: Risk of aspiration will decrease Outcome: Progressing Goal: Dietary intake will improve Outcome: Progressing   Problem: Ischemic Stroke/TIA Tissue Perfusion: Goal: Complications of ischemic stroke/TIA will be minimized Outcome: Progressing   Problem: Education: Goal: Knowledge of General Education information will improve Description: Including pain rating scale, medication(s)/side effects and non-pharmacologic comfort measures Outcome: Progressing   Problem: Health Behavior/Discharge Planning: Goal: Ability to manage health-related needs will improve Outcome: Progressing   Problem: Clinical Measurements: Goal: Ability to maintain clinical measurements within normal limits will improve Outcome: Progressing Goal: Will remain free from infection Outcome: Progressing Goal: Diagnostic test results will improve Outcome: Progressing Goal: Respiratory complications will improve Outcome: Progressing Goal:  Cardiovascular complication will be avoided Outcome: Progressing   Problem: Activity: Goal: Risk for activity intolerance will decrease Outcome: Progressing   Problem: Nutrition: Goal: Adequate nutrition will be maintained Outcome: Progressing   Problem: Coping: Goal: Level of anxiety will decrease Outcome: Progressing   Problem: Elimination: Goal: Will not experience complications related to bowel motility Outcome: Progressing Goal: Will not experience complications related to urinary retention Outcome: Progressing   Problem: Pain Managment: Goal: General experience of comfort will improve Outcome: Progressing   Problem: Safety: Goal: Ability to remain free from injury will improve Outcome: Progressing   Problem: Skin Integrity: Goal: Risk for impaired skin integrity will decrease Outcome: Progressing   Problem: Education: Goal: Knowledge of disease or condition will improve Outcome: Progressing Goal: Understanding of discharge needs will improve Outcome: Progressing   Problem: Health Behavior/Discharge Planning: Goal: Ability to identify changes in lifestyle to reduce recurrence of condition will improve Outcome: Progressing Goal: Identification of resources available to assist in meeting health care needs will improve Outcome: Progressing   Problem: Physical Regulation: Goal: Complications related to the disease process, condition or treatment will be avoided or minimized Outcome: Progressing   Problem: Safety: Goal: Ability to remain free from injury will improve Outcome: Progressing   Velera Lansdale, BSN, RN 

## 2019-08-30 NOTE — Progress Notes (Signed)
Patient NTS per MD order. Moderate amount of yellow secretions noted. Patient tolerated well.

## 2019-08-30 NOTE — Progress Notes (Signed)
TF rate increased to 35 cc/hr at 0300.

## 2019-08-30 NOTE — Consult Note (Signed)
Consultation Note Date: 08/30/2019   Patient Name: Ryan Lane  DOB: [birth year]  MRN: [id number]  Age / Sex: 68 y.o., male  PCP: Patient, No Pcp Per Referring Physician: Shon Hale, MD  Reason for Consultation: Establishing goals of care and Psychosocial/spiritual support  HPI/Patient Profile: 68 y.o. male  with past medical history of polysubstance abuse who was admitted from [facility] (where he was admitted on 11/7) on 08/28/2019 with MRSA bacteremia,  RML pneumonia, cerebellar stroke and persistent encephalopathy. TEE at [facility] was unrevealing.  He had a fall at [facility] and has a left psoas hematoma.  Imaging shows bilateral pulmonary densities which are described as cavitary in the RUL.  Cor Trak was placed for nutrition and the patient aspirated on tube feeds.  Clinical Assessment and Goals of Care:  I have reviewed medical records including EPIC notes, labs and imaging, received report from Dr. Rosalie Gums, assessed the patient and then spoke on the phone with his nephew [name] to discuss diagnosis prognosis, GOC, EOL wishes, disposition and options.  I introduced Palliative Medicine as specialized medical care for people living with serious illness. It focuses on providing relief from the symptoms and stress of a serious illness. The goal is to improve quality of life for both the patient and the family.  We discussed a brief life review of the patient. 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  Ryan Lane unfortunately has a long substance abuse history which has estranged him from his twin sons who now live in [place].  He has a closer relationship with his sister and nephew Ryan Lane).  Per Ryan Stable his sister is elderly and can not emotionally handle assisting with medical decision making.  Thus Ryan Stable is the closest reasonably available relative with the patient's best  interest at heart thereby making Bill his surrogate decision maker.  Bill used to be an EMT and therefore has medical knowledge.  We discussed Rick's current illness and what it means in the larger context of his on-going co-morbidities.  Natural disease trajectory and expectations at EOL were discussed.  Specifically we talked about MRSA bacteremia and likely endocarditis.  Ryan Stable was under the impression that Ryan Lane was transferred to [facility] for a TEE and expected it to have been done already.  We discussed Rick's inability to have MRI to further evaluate his brain and that EEG was negative for seizure.  On my exam of Ryan Lane he is awake and attempts to respond to my questions however his head hangs to the left and his voice is so wet it is unintelligible.   Rick falls asleep quickly as I attempt to dial Ryan Stable so that Ryan Stable can hear his voice.  I discussed code status with Bill.  His opinion is that Ryan Lane should be a DNR.  However, he retreats a bit and states that he will call the patient's son Ryan Lane [phone] to speak with him this evening.  Evidently of the twins Ryan Lane wishes to be kept up to date about  his father.  Questions and concerns were addressed.  The family was encouraged to call with questions or concerns.    Primary Decision Maker:  OTHER Being determined.  At this point Rush Landmark is the patient's only reasonably available relative making him our decision maker.  However I will attempt to speak with the patient's son Ryan Lane in the morning on 11/15.    SUMMARY OF RECOMMENDATIONS    Proceed with TEE. Full scope treatment for now. PMT will meet at bedside with Karlene Einstein at noon on 11/15, and call Destin Vinsant in am on 11/15 for further discussion of code status and goals.  Code Status/Advance Care Planning:  Full code   Symptom Management:   Per primary team.  Agree with holding tube feeds for now as patient is very "wet"  Additional Recommendations (Limitations, Scope,  Preferences):  Full Scope Treatment  Palliative Prophylaxis:   Aspiration  Psycho-social/Spiritual:   Desire for further Chaplaincy support: Not discussed.  Prognosis:  Unable to determine.  The patient is critically ill and aspirating on tube feeds and secretions.      Discharge Planning: To Be Determined      Primary Diagnoses: Present on Admission: . MRSA bacteremia . Alcohol withdrawal ([place]) . Acute CVA (cerebrovascular accident) ([place]) . Hyponatremia   I have reviewed the medical record, interviewed the patient and family, and examined the patient. The following aspects are pertinent.  History reviewed. No pertinent past medical history. Social History   Socioeconomic History  . Marital status: Single    Spouse name: Not on file  . Number of children: Not on file  . Years of education: Not on file  . Highest education level: Not on file  Occupational History  . Not on file  Social Needs  . Financial resource strain: Not on file  . Food insecurity    Worry: Not on file    Inability: Not on file  . Transportation needs    Medical: Not on file    Non-medical: Not on file  Tobacco Use  . Smoking status: Current Every Day Smoker  . Smokeless tobacco: Never Used  Substance and Sexual Activity  . Alcohol use: Not on file  . Drug use: Not on file  . Sexual activity: Not on file  Lifestyle  . Physical activity    Days per week: Not on file    Minutes per session: Not on file  . Stress: Not on file  Relationships  . Social Herbalist on phone: Not on file    Gets together: Not on file    Attends religious service: Not on file    Active member of club or organization: Not on file    Attends meetings of clubs or organizations: Not on file    Relationship status: Not on file  Other Topics Concern  . Not on file  Social History Narrative  . Not on file   Family History  Family history unknown: Yes   Scheduled Meds: . feeding supplement  (OSMOLITE 1.5 CAL)  1,000 mL Per Tube Q24H  . feeding supplement (PRO-STAT SUGAR FREE 64)  30 mL Per Tube BID  . folic acid  1 mg Oral Daily  . LORazepam  0-4 mg Intravenous Q12H  . multivitamin with minerals  1 tablet Per Tube Daily  . thiamine  100 mg Oral Daily   Or  . thiamine  100 mg Intravenous Daily   Continuous Infusions: . dextrose 5 % and 0.45 %  NaCl with KCl 10 mEq/L 125 mL/hr at 08/30/19 0312  . folic acid (FOLVITE) IVPB 1 mg (08/30/19 1016)  . vancomycin 1,000 mg (08/30/19 1620)   PRN Meds:.acetaminophen **OR** acetaminophen (TYLENOL) oral liquid 160 mg/5 mL **OR** acetaminophen, LORazepam **OR** LORazepam No Known Allergies Review of Systems patient's voice is unintelligible  Physical Exam  Well developed male.  Ill appearing.  Head hanging left.  Voice very garbled.  He quickly falls back to sleep. CV tachy resp no distress Abdomen soft, nt, nd  Vital Signs: BP (!) 144/71 (BP Location: Right Arm)   Pulse (!) 110   Temp 99.9 F (37.7 C) (Oral)   Resp (!) 27   Wt 63.9 kg   SpO2 99%  Pain Scale: 0-10   Pain Score: 0-No pain   SpO2: SpO2: 99 % O2 Device:SpO2: 99 % O2 Flow Rate: .O2 Flow Rate (L/min): 3 L/min  IO: Intake/output summary:   Intake/Output Summary (Last 24 hours) at 08/30/2019 1750 Last data filed at 08/30/2019 [id number] Gross per 24 hour  Intake 1080 ml  Output 1750 ml  Net -670 ml    LBM: Last BM Date: 08/30/19 Baseline Weight: Weight: 61.8 kg Most recent weight: Weight: 63.9 kg     Palliative Assessment/Data: 10%     Time In: 5:00 Time Out: 6:30 Time Total: 90 min Visit consisted of counseling and education dealing with the complex and emotionally intense issues surrounding the need for palliative care and symptom management in the setting of serious and potentially life-threatening illness. Greater than 50%  of this time was spent counseling and coordinating care related to the above assessment and plan.  Signed by: Norvel RichardsMarianne  Bernedette Auston, PA-C Palliative Medicine Pager: [phone]  Please contact Palliative Medicine Team phone at [phone] for questions and concerns.  For individual provider: See Loretha StaplerAmion

## 2019-08-30 NOTE — Progress Notes (Signed)
PHARMACY - PHYSICIAN COMMUNICATION CRITICAL VALUE ALERT - BLOOD CULTURE IDENTIFICATION (BCID)  Ryan Lane is an 68 y.o. male who presented to [facility] on 08/28/2019 with a chief complaint of MRSA bacteremia per the discharge summary from [facility].  Assessment:  4/4 aerobic bottles positive for GPCs identified as Staph aureus and is MEC A positive  Name of physician (or Provider) Contacted: None  Current antibiotics: Vancomycin  Changes to prescribed antibiotics recommended:  Patient is on recommended antibiotics - No changes needed   Corinda Gubler 08/30/2019  5:17 AM

## 2019-08-30 NOTE — Progress Notes (Signed)
Pt found sideways in bed with alarm sounding. Pt returned to upright position, gurgle noted by RN. MD notified; bed placed at 45 degrees, Tube feeding stopped, and aspirate via coretrack NG tube was 0 mL. Respiratory Therapy consulted. Also Tylenol Supp given for oral Temp 100.7.  Will continue to monitor

## 2019-08-30 NOTE — Progress Notes (Signed)
Patient Demographics:    Ryan Lane, is a 68 y.o. male, DOB - 06-15-1951, RDE:[id number]  Admit date - 08/28/2019   Admitting Physician Hughie Closs, MD  Outpatient Primary MD for the patient is Patient, No Pcp Per  LOS - 2  No chief complaint on file.    Subjective:    Ryan Lane today has  no emesis,  No chest pain,  -Remains lethargic and somewhat  responsive  Concerns about recurrent aspirations--- Tube Feed on Hold  Assessment  & Plan :    Principal Problem:   MRSA bacteremia Active Problems:   Alcohol withdrawal (HCC)   Acute CVA (cerebrovascular accident) (HCC)   Hyponatremia  Brief Summary:- 68 y.o. male with a history of alcohol abuse who was brought to [facility] on November 7 and found to have pneumonia/bacteremia with MRSA, transferred to [facility] on 08/28/2019 for further treatment and possible TEE EEG and TTE at [facility] were largely unremarkable -awaiting palliative care input for goals of care and advanced directives   CT Chest/CT Abd/Pelvis 1. Left psoas intramuscular hematoma with evidence of small active bleed. 2. No bowel obstruction or active inflammation. Normal appendix. 3. Sigmoid diverticulosis. 4. Bilateral pulmonary densities with pattern concerning for fungal infection, abscesses, TB, or septic emboli. Other etiologies are not excluded. Clinical correlation is recommended. Trace left pleural effusion. 5. Slight heterogeneous nephrogram may be artifactual or represent pyelonephritis. Correlation with urinalysis recommended.   A/p 1) Left Psoas intramuscular hematoma with evidence of small active bleed--- stop subcu Lovenox, stop aspirin, -Continue to monitor serial H&H and transfuse as clinically indicated -Hemoglobin currently down to 11.6 from 15.4 -Discussed with radiologist no specific intervention required at this time - 2) acute  toxic/metabolic encephalopathy--- in the setting of MRSA bacteremia and pneumonia---  -continue IV vancomycin,  -Repeat EEG with diffuse encephalopathy without epileptiform findings -Continue thiamine replacement, low index of suspicion for Warnicke's encephalopathy (history of longstanding alcohol abuse) -Ammonia is 45, and TSH is 0.489 =-Significant leukocytosis persist  3)Cerebellar CVA--MRI from [facility] with acute stroke (punctate cerebellar infarct/ right cerebellar and right corona radiata )----- may be embolic, however unable to give aspirin due to #1 above - repeat MRI Brain requested by neurology service  --defer to neurology service  4)Sepsis secondary to MRSA Bacteremia and pneumonia--- -  4/4 aerobic bottles positive for GPCs identified as Staph aureus and is MEC A positive  WBC remains elevated above 30 K,  - lactic acid of 2.2--continue IV vancomycin, repeat blood cultures from 08/28/2019 + again --awaiting palliative care input for goals of care and advanced directives --If family desires aggressive protocols may have to consider TEE with concerns about endocarditis given persistently positive blood cultures  5)Polysubstance Abuse--- as per patient's nephew [name]--patient has history of methamphetamine, THC alcohol and other illicit drug use  6)Social/Ethics--patient is estranged from his 2 sons over the years due to polysubstance abuse, patient sister [name] and patient's nephew [name] his primary contact - I called and d/w nephew [name] -[phone] --awaiting palliative care input for goals of care and advanced directives  7) dysphagia/diet--- place CorTrac/Keofeed tube for feeding purposes, -tube feeding per dietitian/nutritionist TF currently on hold due to aspiration concerns  Disposition/Need for in-Hospital  Stay- patient unable to be discharged at this time due to --- sepsis secondary to MRSA bacteremia and pneumonia requiring IV  antibiotics as well as acute stroke requiring further monitoring  --awaiting palliative care input for goals of care and advanced directives  Code Status : Full code  Family Communication:     d/w nephew Karlene Einstein -[phone]  Disposition Plan  : TBD  Consults  :  Neurology/ID/Radiology/palliative care services  DVT Prophylaxis  :  - SCDs (active psoas of bleeding)  Lab Results  Component Value Date   PLT 195 08/29/2019    Inpatient Medications  Scheduled Meds:  feeding supplement (OSMOLITE 1.5 CAL)  1,000 mL Per Tube Q24H   feeding supplement (PRO-STAT SUGAR FREE 64)  30 mL Per Tube BID   folic acid  1 mg Oral Daily   LORazepam  0-4 mg Intravenous Q12H   multivitamin with minerals  1 tablet Per Tube Daily   thiamine  100 mg Oral Daily   Or   thiamine  100 mg Intravenous Daily   Continuous Infusions:  dextrose 5 % and 0.45 % NaCl with KCl 10 mEq/L 125 mL/hr at 42/87/68 1157   folic acid (FOLVITE) IVPB Stopped (08/29/19 1401)   vancomycin 1,000 mg (08/30/19 0313)   PRN Meds:.acetaminophen **OR** acetaminophen (TYLENOL) oral liquid 160 mg/5 mL **OR** acetaminophen, LORazepam **OR** LORazepam    Anti-infectives (From admission, onward)   Start     Dose/Rate Route Frequency Ordered Stop   08/28/19 1600  vancomycin (VANCOCIN) IVPB 1000 mg/200 mL premix     1,000 mg 200 mL/hr over 60 Minutes Intravenous Every 12 hours 08/28/19 0409     08/28/19 0315  vancomycin (VANCOCIN) 1,250 mg in sodium chloride 0.9 % 250 mL IVPB  Status:  Discontinued     1,250 mg 166.7 mL/hr over 90 Minutes Intravenous  Once 08/28/19 0307 08/28/19 0549        Objective:   Vitals:   08/30/19 0317 08/30/19 0403 08/30/19 0424 08/30/19 0755  BP: (!) 144/72 140/70  (!) 149/79  Pulse: (!) 120 (!) 126  (!) 122  Resp: (!) 23   (!) 33  Temp: 97.7 F (36.5 C)   (!) 100.7 F (38.2 C)  TempSrc: Oral   Oral  SpO2: 100%   99%  Weight:   63.9 kg     Wt Readings from Last 3 Encounters:    08/30/19 63.9 kg     Intake/Output Summary (Last 24 hours) at 08/30/2019 0843 Last data filed at 08/30/2019 0558 Gross per 24 hour  Intake 2807.66 ml  Output 2650 ml  Net 157.66 ml   Physical Exam  Gen:-Less lethargic HEENT:- [place].AT, No sclera icterus Nose- Lt Nare-withCorTrac tube in situ Neck-Supple Neck,No JVD,.  Lungs-diminished breath sounds with scattered rhonchi CV- S1, S2 normal   abd-  +ve B.Sounds, Abd Soft, No tenderness,    Extremity/Skin:- No  edema, pedal pulses present  Psych-less lethargic Neuro-generalized weakness, some right-sided hemiparesis, right upper extremity worse than left, neuro exam is limited due to  inability to consistently follow commands     Data Review:   Micro Results Recent Results (from the past 240 hour(s))  Culture, blood (routine x 2)     Status: None (Preliminary result)   Collection Time: 08/28/19  4:52 PM   Specimen: BLOOD LEFT HAND  Result Value Ref Range Status   Specimen Description BLOOD LEFT HAND  Final   Special Requests AEROBIC BOTTLE ONLY Blood Culture adequate volume  Final   Culture  Setup Time   Final    AEROBIC BOTTLE ONLY GRAM POSITIVE COCCI Organism ID to follow CRITICAL RESULT CALLED TO, READ BACK BY AND VERIFIED WITHCarlynn Herald [facility] 08/30/19 1610 JDW Performed at [facility], [address] [place], [place] [address]    Culture PENDING  Incomplete   Report Status PENDING  Incomplete  Culture, blood (routine x 2)     Status: Abnormal (Preliminary result)   Collection Time: 08/28/19  4:52 PM   Specimen: BLOOD LEFT HAND  Result Value Ref Range Status   Specimen Description BLOOD LEFT HAND  Final   Special Requests AEROBIC BOTTLE ONLY Blood Culture adequate volume  Final   Culture  Setup Time   Final    GRAM POSITIVE COCCI AEROBIC BOTTLE ONLY CRITICAL VALUE NOTED.  VALUE IS CONSISTENT WITH PREVIOUSLY REPORTED AND CALLED VALUE. Performed at [facility], [address], [place], [place]  [address]    Culture STAPHYLOCOCCUS AUREUS (A)  Final   Report Status PENDING  Incomplete  Blood Culture ID Panel (Reflexed)     Status: Abnormal   Collection Time: 08/28/19  4:52 PM  Result Value Ref Range Status   Enterococcus species NOT DETECTED NOT DETECTED Final   Listeria monocytogenes NOT DETECTED NOT DETECTED Final   Staphylococcus species DETECTED (A) NOT DETECTED Final    Comment: CRITICAL RESULT CALLED TO, READ BACK BY AND VERIFIED WITH: C PIERCE PHARMD 08/30/19 0514 JDW    Staphylococcus aureus (BCID) DETECTED (A) NOT DETECTED Final    Comment: Methicillin (oxacillin)-resistant Staphylococcus aureus (MRSA). MRSA is predictably resistant to beta-lactam antibiotics (except ceftaroline). Preferred therapy is vancomycin unless clinically contraindicated. Patient requires contact precautions if  hospitalized. CRITICAL RESULT CALLED TO, READ BACK BY AND VERIFIED WITH: C PIERCE PHARMD 08/30/19 0514 JDW    Methicillin resistance DETECTED (A) NOT DETECTED Final    Comment: CRITICAL RESULT CALLED TO, READ BACK BY AND VERIFIED WITH: C PIERCE PHARMD 08/30/19 0514 JDW    Streptococcus species NOT DETECTED NOT DETECTED Final   Streptococcus agalactiae NOT DETECTED NOT DETECTED Final   Streptococcus pneumoniae NOT DETECTED NOT DETECTED Final   Streptococcus pyogenes NOT DETECTED NOT DETECTED Final   Acinetobacter baumannii NOT DETECTED NOT DETECTED Final   Enterobacteriaceae species NOT DETECTED NOT DETECTED Final   Enterobacter cloacae complex NOT DETECTED NOT DETECTED Final   Escherichia coli NOT DETECTED NOT DETECTED Final   Klebsiella oxytoca NOT DETECTED NOT DETECTED Final   Klebsiella pneumoniae NOT DETECTED NOT DETECTED Final   Proteus species NOT DETECTED NOT DETECTED Final   Serratia marcescens NOT DETECTED NOT DETECTED Final   Haemophilus influenzae NOT DETECTED NOT DETECTED Final   Neisseria meningitidis NOT DETECTED NOT DETECTED Final   Pseudomonas aeruginosa NOT DETECTED  NOT DETECTED Final   Candida albicans NOT DETECTED NOT DETECTED Final   Candida glabrata NOT DETECTED NOT DETECTED Final   Candida krusei NOT DETECTED NOT DETECTED Final   Candida parapsilosis NOT DETECTED NOT DETECTED Final   Candida tropicalis NOT DETECTED NOT DETECTED Final    Comment: Performed at [facility], [address], [place], [place] [address]  Culture, blood (routine x 2)     Status: Abnormal (Preliminary result)   Collection Time: 08/28/19  8:34 PM   Specimen: BLOOD LEFT HAND  Result Value Ref Range Status   Specimen Description BLOOD LEFT HAND  Final   Special Requests   Final    BOTTLES DRAWN  AEROBIC ONLY Blood Culture results may not be optimal due to an inadequate volume of blood received in culture bottles   Culture  Setup Time   Final    AEROBIC BOTTLE ONLY GRAM POSITIVE COCCI CRITICAL VALUE NOTED.  VALUE IS CONSISTENT WITH PREVIOUSLY REPORTED AND CALLED VALUE. Performed at [facility], [address] [place], [place] [address]    Culture STAPHYLOCOCCUS AUREUS (A)  Final   Report Status PENDING  Incomplete  Culture, blood (routine x 2)     Status: Abnormal (Preliminary result)   Collection Time: 08/28/19  8:34 PM   Specimen: BLOOD RIGHT HAND  Result Value Ref Range Status   Specimen Description BLOOD RIGHT HAND  Final   Special Requests   Final    BOTTLES DRAWN AEROBIC ONLY Blood Culture results may not be optimal due to an inadequate volume of blood received in culture bottles   Culture  Setup Time   Final    GRAM POSITIVE COCCI IN CLUSTERS AEROBIC BOTTLE ONLY CRITICAL RESULT CALLED TO, READ BACK BY AND VERIFIED WITH: PHARMD CHRIS WALSTON [id number] FCP Performed at [facility], [address], [place], [place] [address]    Culture STAPHYLOCOCCUS AUREUS (A)  Final   Report Status PENDING  Incomplete    Radiology Reports Ct Chest W Contrast  Result Date: 08/28/2019 CLINICAL DATA:  68 year old male with fever and leukocytosis. EXAM:  CT CHEST, ABDOMEN, AND PELVIS WITH CONTRAST TECHNIQUE: Multidetector CT imaging of the chest, abdomen and pelvis was performed following the standard protocol during bolus administration of intravenous contrast. CONTRAST:  OMNIPAQUE IOHEXOL 300 MG/ML  SOLN COMPARISON:  Chest radiograph dated 08/22/2019. FINDINGS: CT CHEST FINDINGS Cardiovascular: There is no cardiomegaly or pericardial effusion. Calcification of the mitral annulus noted. There is mild atherosclerotic calcification of the thoracic aorta. No aneurysmal dilatation or dissection. The central pulmonary arteries are unremarkable as visualized. Mediastinum/Nodes: No hilar or mediastinal adenopathy. The esophagus and the thyroid gland are grossly unremarkable. No mediastinal fluid collection. Lungs/Pleura: Trace left pleural effusion. Scattered clusters of ground-glass and nodular opacities with central cavitation primarily involving the upper lobes noted. A patchy area of airspace opacity is noted in the right upper lobe with areas of cavitation. Findings may represent fungal infection, abscesses, TB, or septic emboli. Other etiologies are not excluded. Clinical correlation is recommended. There is no pneumothorax. Mucus content noted in the distal trachea extending into the left mainstem bronchus. The central airways however remain patent. Musculoskeletal: No chest wall mass or suspicious bone lesions identified. CT ABDOMEN PELVIS FINDINGS No intra-abdominal free air or free fluid. Hepatobiliary: The liver is unremarkable as visualized. No intrahepatic biliary ductal dilatation. The gallbladder is unremarkable. Pancreas: Unremarkable. No pancreatic ductal dilatation or surrounding inflammatory changes. Spleen: Normal in size without focal abnormality. Adrenals/Urinary Tract: The adrenal glands are unremarkable. There is no hydronephrosis on either side. There is symmetric enhancement and excretion of contrast by both kidneys. Slightly striated  enhancement pattern of the renal parenchyma may be artifactual. Correlation with urinalysis recommended to exclude pyelonephritis. A subcentimeter left renal hypodense lesion is too small to characterize. The visualized ureters appear unremarkable. The urinary bladder is predominantly collapsed. There is apparent diffuse thickening of the bladder wall which may be partly related to underdistention. Cystitis is not excluded. Correlation with urinalysis recommended. Stomach/Bowel: A rectal tube and contrast noted throughout the colon. There is no bowel obstruction or active inflammation. There are scattered sigmoid diverticula without active inflammation. The appendix is  normal. Vascular/Lymphatic: Mild aortoiliac atherosclerotic disease. The IVC is unremarkable. No portal venous gas. There is no adenopathy. Reproductive: The prostate and seminal vesicles are grossly unremarkable. Other: There is intramuscular hematoma involving the left psoas muscle extending to the left iliacus muscle. The hematoma in the left psoas muscle measures approximately 6.5 x 5.3 cm in greatest axial dimensions and 10 cm in craniocaudal length. There is linear contrast blushing within the left psoas muscle suggestive of active bleed. There is a 3.7 x 3.5 cm low attenuating/cystic structure in the region of the left inguinal canal which may represent a seroma or an old hematoma, or possibly a lymphocele. A necrotic mass or lymph node is favored less likely but not excluded. Correlation with clinical exam and follow-up recommended. Ultrasound may provide better evaluation of this structure. Musculoskeletal: Degenerative changes of the spine. No acute osseous pathology. IMPRESSION: 1. Left psoas intramuscular hematoma with evidence of small active bleed. 2. No bowel obstruction or active inflammation. Normal appendix. 3. Sigmoid diverticulosis. 4. Bilateral pulmonary densities with pattern concerning for fungal infection, abscesses, TB, or  septic emboli. Other etiologies are not excluded. Clinical correlation is recommended. Trace left pleural effusion. 5. Slight heterogeneous nephrogram may be artifactual or represent pyelonephritis. Correlation with urinalysis recommended. 6. Aortic Atherosclerosis (ICD10-I70.0). These results were called by telephone at the time of interpretation on 08/28/2019 at 1:45 pm to [facility] , who verbally acknowledged these results. Electronically Signed   By: Elgie CollardArash  Radparvar M.D.   On: 08/28/2019 13:51   Ct Abdomen Pelvis W Contrast  Result Date: 08/28/2019 CLINICAL DATA:  68 year old male with fever and leukocytosis. EXAM: CT CHEST, ABDOMEN, AND PELVIS WITH CONTRAST TECHNIQUE: Multidetector CT imaging of the chest, abdomen and pelvis was performed following the standard protocol during bolus administration of intravenous contrast. CONTRAST:  100mL OMNIPAQUE IOHEXOL 300 MG/ML  SOLN COMPARISON:  Chest radiograph dated 08/22/2019. FINDINGS: CT CHEST FINDINGS Cardiovascular: There is no cardiomegaly or pericardial effusion. Calcification of the mitral annulus noted. There is mild atherosclerotic calcification of the thoracic aorta. No aneurysmal dilatation or dissection. The central pulmonary arteries are unremarkable as visualized. Mediastinum/Nodes: No hilar or mediastinal adenopathy. The esophagus and the thyroid gland are grossly unremarkable. No mediastinal fluid collection. Lungs/Pleura: Trace left pleural effusion. Scattered clusters of ground-glass and nodular opacities with central cavitation primarily involving the upper lobes noted. A patchy area of airspace opacity is noted in the right upper lobe with areas of cavitation. Findings may represent fungal infection, abscesses, TB, or septic emboli. Other etiologies are not excluded. Clinical correlation is recommended. There is no pneumothorax. Mucus content noted in the distal trachea extending into the left mainstem bronchus. The central  airways however remain patent. Musculoskeletal: No chest wall mass or suspicious bone lesions identified. CT ABDOMEN PELVIS FINDINGS No intra-abdominal free air or free fluid. Hepatobiliary: The liver is unremarkable as visualized. No intrahepatic biliary ductal dilatation. The gallbladder is unremarkable. Pancreas: Unremarkable. No pancreatic ductal dilatation or surrounding inflammatory changes. Spleen: Normal in size without focal abnormality. Adrenals/Urinary Tract: The adrenal glands are unremarkable. There is no hydronephrosis on either side. There is symmetric enhancement and excretion of contrast by both kidneys. Slightly striated enhancement pattern of the renal parenchyma may be artifactual. Correlation with urinalysis recommended to exclude pyelonephritis. A subcentimeter left renal hypodense lesion is too small to characterize. The visualized ureters appear unremarkable. The urinary bladder is predominantly collapsed. There is apparent diffuse thickening of the bladder wall which may be partly related to underdistention.  Cystitis is not excluded. Correlation with urinalysis recommended. Stomach/Bowel: A rectal tube and contrast noted throughout the colon. There is no bowel obstruction or active inflammation. There are scattered sigmoid diverticula without active inflammation. The appendix is normal. Vascular/Lymphatic: Mild aortoiliac atherosclerotic disease. The IVC is unremarkable. No portal venous gas. There is no adenopathy. Reproductive: The prostate and seminal vesicles are grossly unremarkable. Other: There is intramuscular hematoma involving the left psoas muscle extending to the left iliacus muscle. The hematoma in the left psoas muscle measures approximately 6.5 x 5.3 cm in greatest axial dimensions and 10 cm in craniocaudal length. There is linear contrast blushing within the left psoas muscle suggestive of active bleed. There is a 3.7 x 3.5 cm low attenuating/cystic structure in the region of  the left inguinal canal which may represent a seroma or an old hematoma, or possibly a lymphocele. A necrotic mass or lymph node is favored less likely but not excluded. Correlation with clinical exam and follow-up recommended. Ultrasound may provide better evaluation of this structure. Musculoskeletal: Degenerative changes of the spine. No acute osseous pathology. IMPRESSION: 1. Left psoas intramuscular hematoma with evidence of small active bleed. 2. No bowel obstruction or active inflammation. Normal appendix. 3. Sigmoid diverticulosis. 4. Bilateral pulmonary densities with pattern concerning for fungal infection, abscesses, TB, or septic emboli. Other etiologies are not excluded. Clinical correlation is recommended. Trace left pleural effusion. 5. Slight heterogeneous nephrogram may be artifactual or represent pyelonephritis. Correlation with urinalysis recommended. 6. Aortic Atherosclerosis (ICD10-I70.0). These results were called by telephone at the time of interpretation on 08/28/2019 at 1:45 pm to provider [facility] , who verbally acknowledged these results. Electronically Signed   By: Elgie Collard M.D.   On: 08/28/2019 13:51     CBC Recent Labs  Lab 08/28/19 0245 08/28/19 0627 08/28/19 1403 08/28/19 1652 08/28/19 2034 08/29/19 0204 08/29/19 0724  WBC 29.1* 31.7*  --   --   --   --  31.9*  HGB 14.4 15.4 13.5 13.6 13.4 14.3 11.6*  HCT 42.0 45.3 38.8* 41.7 38.9* 42.6 34.4*  PLT 191 199  --   --   --   --  195  MCV 93.1 96.0  --   --   --   --  96.4  MCH 31.9 32.6  --   --   --   --  32.5  MCHC 34.3 34.0  --   --   --   --  33.7  RDW 13.2 13.4  --   --   --   --  13.9  LYMPHSABS 0.9  --   --   --   --   --   --   MONOABS 2.0*  --   --   --   --   --   --   EOSABS 0.3  --   --   --   --   --   --   BASOSABS 0.0  --   --   --   --   --   --     Chemistries  Recent Labs  Lab 08/28/19 0245 08/28/19 0510 08/28/19 0952 08/28/19 1403 08/29/19 0724  NA 148* 149* 151* 148*  147*  K 3.4* 3.6 3.6 3.6 3.5  CL 112* 113* 113* 111 110  CO2 GLUCOSE 136* 121* 117* 121* 160*  BUN CREATININE 0.56* 0.72 0.70 0.83 0.81  CALCIUM 8.6* 8.8* 8.7* 8.6* 8.0*  MG 1.7  --   --   --   --   AST 32  --   --   --  28  ALT 23  --   --   --  16  ALKPHOS 79  --   --   --  77  BILITOT 1.2  --   --   --  0.7   ------------------------------------------------------------------------------------------------------------------ Recent Labs    08/28/19 0245  CHOL 99  HDL 19*  LDLCALC 67  TRIG 66  CHOLHDL 5.2    Lab Results  Component Value Date   HGBA1C 5.1 08/28/2019   ------------------------------------------------------------------------------------------------------------------ Recent Labs    08/28/19 0510  TSH 0.489   ------------------------------------------------------------------------------------------------------------------ No results for input(s): VITAMINB12, FOLATE, FERRITIN, TIBC, IRON, RETICCTPCT in the last 72 hours.  Coagulation profile No results for input(s): INR, PROTIME in the last 168 hours.  No results for input(s): DDIMER in the last 72 hours.  Cardiac Enzymes No results for input(s): CKMB, TROPONINI, MYOGLOBIN in the last 168 hours.  Invalid input(s): CK ------------------------------------------------------------------------------------------------------------------ No results found for: BNP   Shon Hale M.D on 08/30/2019 at 8:43 AM  Go to [email or web address] - for contact info  [facility] - Office  [phone]

## 2019-08-30 NOTE — Plan of Care (Signed)
  Problem: Education: Goal: Knowledge of disease or condition will improve Outcome: Progressing Goal: Knowledge of secondary prevention will improve Outcome: Progressing Goal: Knowledge of patient specific risk factors addressed and post discharge goals established will improve Outcome: Progressing Goal: Individualized Educational Video(s) Outcome: Progressing   Problem: Coping: Goal: Will verbalize positive feelings about self Outcome: Progressing Goal: Will identify appropriate support needs Outcome: Progressing   Problem: Health Behavior/Discharge Planning: Goal: Ability to manage health-related needs will improve Outcome: Progressing   Problem: Self-Care: Goal: Ability to participate in self-care as condition permits will improve Outcome: Progressing Goal: Verbalization of feelings and concerns over difficulty with self-care will improve Outcome: Progressing Goal: Ability to communicate needs accurately will improve Outcome: Progressing   Problem: Nutrition: Goal: Risk of aspiration will decrease Outcome: Progressing Goal: Dietary intake will improve Outcome: Progressing   Problem: Ischemic Stroke/TIA Tissue Perfusion: Goal: Complications of ischemic stroke/TIA will be minimized Outcome: Progressing   Problem: Education: Goal: Knowledge of General Education information will improve Description: Including pain rating scale, medication(s)/side effects and non-pharmacologic comfort measures Outcome: Progressing   Problem: Health Behavior/Discharge Planning: Goal: Ability to manage health-related needs will improve Outcome: Progressing   Problem: Clinical Measurements: Goal: Ability to maintain clinical measurements within normal limits will improve Outcome: Progressing Goal: Will remain free from infection Outcome: Progressing Goal: Diagnostic test results will improve Outcome: Progressing Goal: Respiratory complications will improve Outcome: Progressing Goal:  Cardiovascular complication will be avoided Outcome: Progressing   Problem: Activity: Goal: Risk for activity intolerance will decrease Outcome: Progressing   Problem: Nutrition: Goal: Adequate nutrition will be maintained Outcome: Progressing   Problem: Coping: Goal: Level of anxiety will decrease Outcome: Progressing   Problem: Elimination: Goal: Will not experience complications related to bowel motility Outcome: Progressing Goal: Will not experience complications related to urinary retention Outcome: Progressing   Problem: Pain Managment: Goal: General experience of comfort will improve Outcome: Progressing   Problem: Safety: Goal: Ability to remain free from injury will improve Outcome: Progressing   Problem: Skin Integrity: Goal: Risk for impaired skin integrity will decrease Outcome: Progressing   Problem: Education: Goal: Knowledge of disease or condition will improve Outcome: Progressing Goal: Understanding of discharge needs will improve Outcome: Progressing   Problem: Health Behavior/Discharge Planning: Goal: Ability to identify changes in lifestyle to reduce recurrence of condition will improve Outcome: Progressing Goal: Identification of resources available to assist in meeting health care needs will improve Outcome: Progressing   Problem: Physical Regulation: Goal: Complications related to the disease process, condition or treatment will be avoided or minimized Outcome: Progressing   Problem: Safety: Goal: Ability to remain free from injury will improve Outcome: Progressing   Ival Bible, BSN, RN

## 2019-08-30 NOTE — Progress Notes (Signed)
SLP Cancellation Note  Patient Details Name: Hector Taft MRN: [id number] DOB: May 24, 1951   Cancelled treatment:       Reason Eval/Treat Not Completed: Medical issues which prohibited therapy; pt has TF for nutrition/hydration currently; nursing stated he "slid down in bed" and RT deep suctioned him after this incident; TF presently halted; ST will f/u next date as able.   Elvina Sidle, M.S., [place] 08/30/2019, 12:37 PM

## 2019-08-30 NOTE — Progress Notes (Signed)
Pt deep suctioned X 2 by RN. Fluid was thin and tan, also thick grayish sputum. Pt tolerated well. RT notified.

## 2019-08-31 ENCOUNTER — Inpatient Hospital Stay (HOSPITAL_COMMUNITY): Payer: Medicare Other

## 2019-08-31 DIAGNOSIS — F10230 Alcohol dependence with withdrawal, uncomplicated: Secondary | ICD-10-CM

## 2019-08-31 DIAGNOSIS — Z515 Encounter for palliative care: Secondary | ICD-10-CM

## 2019-08-31 DIAGNOSIS — R0600 Dyspnea, unspecified: Secondary | ICD-10-CM

## 2019-08-31 DIAGNOSIS — I639 Cerebral infarction, unspecified: Secondary | ICD-10-CM

## 2019-08-31 DIAGNOSIS — R0689 Other abnormalities of breathing: Secondary | ICD-10-CM

## 2019-08-31 LAB — BASIC METABOLIC PANEL
Anion gap: 10 (ref 5–15)
BUN: 9 mg/dL (ref 8–23)
CO2: 27 mmol/L (ref 22–32)
Calcium: 8.1 mg/dL — ABNORMAL LOW (ref 8.9–10.3)
Chloride: 110 mmol/L (ref 98–111)
Creatinine, Ser: 0.58 mg/dL — ABNORMAL LOW (ref 0.61–1.24)
GFR calc Af Amer: >60 mL/min (ref >60)
GFR calc non Af Amer: >60 mL/min (ref >60)
Glucose, Bld: 110 mg/dL — ABNORMAL HIGH (ref 70–99)
Potassium: 3 mmol/L — ABNORMAL LOW (ref 3.5–5.1)
Sodium: 147 mmol/L — ABNORMAL HIGH (ref 135–145)

## 2019-08-31 LAB — CBC
HCT: 32.2 % — ABNORMAL LOW (ref 39.0–52.0)
Hemoglobin: 11.1 g/dL — ABNORMAL LOW (ref 13.0–17.0)
MCH: 33.3 pg (ref 26.0–34.0)
MCHC: 34.5 g/dL (ref 30.0–36.0)
MCV: 96.7 fL (ref 80.0–100.0)
Platelets: 218 10*3/uL (ref 150–400)
RBC: 3.33 MIL/uL — ABNORMAL LOW (ref 4.22–5.81)
RDW: 13.9 % (ref 11.5–15.5)
WBC: 25.6 10*3/uL — ABNORMAL HIGH (ref 4.0–10.5)
nRBC: 0 % (ref 0.0–0.2)

## 2019-08-31 LAB — CULTURE, BLOOD (ROUTINE X 2)
Special Requests: ADEQUATE
Special Requests: ADEQUATE

## 2019-08-31 LAB — GLUCOSE, CAPILLARY
Glucose-Capillary: 100 mg/dL — ABNORMAL HIGH (ref 70–99)
Glucose-Capillary: 114 mg/dL — ABNORMAL HIGH (ref 70–99)
Glucose-Capillary: 114 mg/dL — ABNORMAL HIGH (ref 70–99)
Glucose-Capillary: 95 mg/dL (ref 70–99)
Glucose-Capillary: 95 mg/dL (ref 70–99)

## 2019-08-31 LAB — URINE CULTURE
Culture: NO GROWTH
Special Requests: NORMAL

## 2019-08-31 LAB — VANCOMYCIN, TROUGH: Vancomycin Tr: 13 ug/mL — ABNORMAL LOW (ref 15–20)

## 2019-08-31 IMAGING — DX DG CHEST 1V PORT
1 series · 1 of 1 positions shown · non-contrast
Comparison: [DATE] and chest, abdomen pelvis CT dated
[DATE].

CLINICAL DATA: Dyspnea and respiratory abnormalities.

EXAM:
PORTABLE CHEST 1 VIEW

[chest]
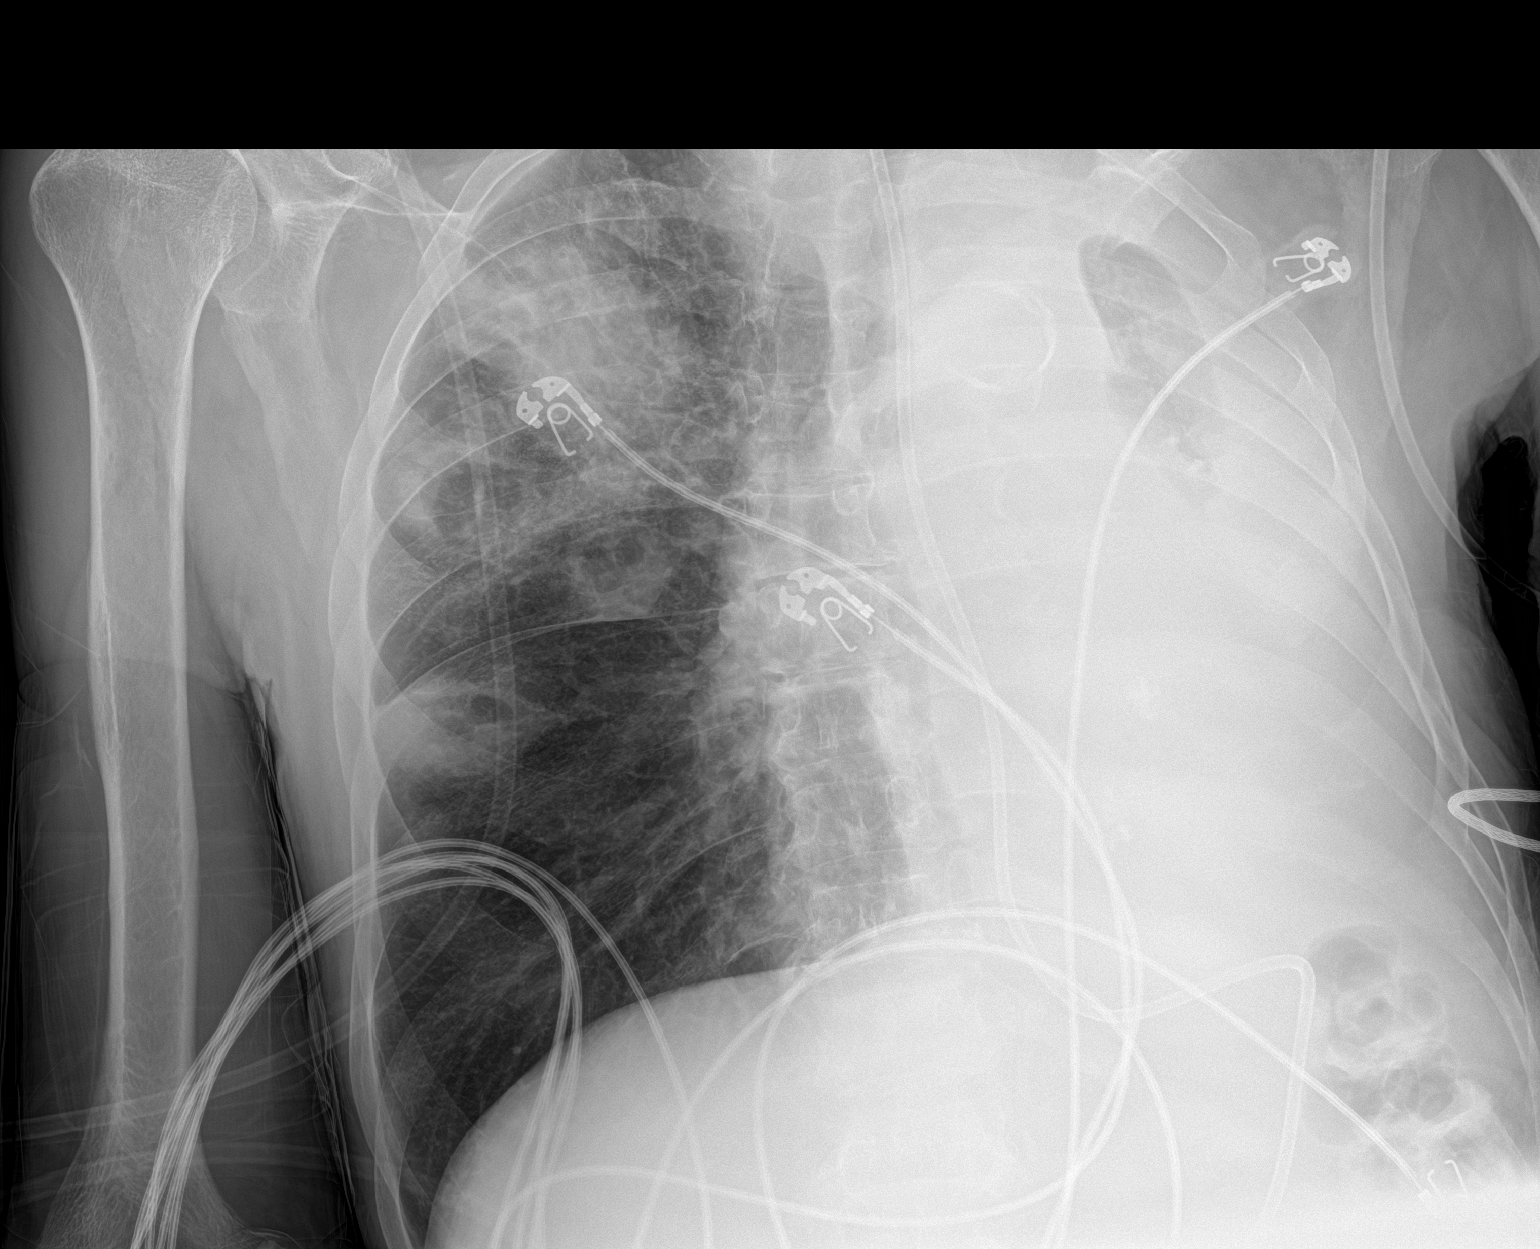

[1 of 1 positions shown; findings below may reference images not displayed]

FINDINGS: Interval large left pleural effusion with online a small amount of
aerated left lung remaining. Rotation of the upper chest to the left
with evidence of interval mediastinal shift to the left as well.
Significantly increased patchy opacity with areas of cavitation in
the right upper and mid lung zones. Diffuse osteopenia. Feeding tube
extending into the stomach.
IMPRESSION: 1. Interval large left pleural effusion with only a small amount of
aerated left lung remaining.
2. Interval mediastinal shift to the left, most likely due to a
large central mucous plug and diffuse left lung atelectasis.
3. Significantly increased patchy opacity with areas of cavitation
in the right upper and mid lung zones compatible with significant
progression of cavitary infection.

## 2019-08-31 MED ORDER — POTASSIUM CHLORIDE 10 MEQ/50ML IV SOLN
10.0000 meq | INTRAVENOUS | Status: AC
  Administered 2019-08-31 (×5): 10 meq via INTRAVENOUS
  Filled 2019-08-31 (×6): qty 50

## 2019-08-31 MED ORDER — GERHARDT'S BUTT CREAM
TOPICAL_CREAM | Freq: Every day | CUTANEOUS | Status: AC
  Administered 2019-08-31 – 2019-09-05 (×6): via TOPICAL
  Administered 2019-09-06: 1 via TOPICAL
  Filled 2019-08-31: qty 1

## 2019-08-31 MED ORDER — HALOPERIDOL LACTATE 5 MG/ML IJ SOLN: 2.0000 mg | Freq: Once | INTRAMUSCULAR | Status: DC | PRN

## 2019-08-31 NOTE — TOC Initial Note (Signed)
Transition of Care [facility]) - Initial/Assessment Note    Patient Details  Name: Ryan Lane MRN: [id number] Date of Birth: Nov 19, 1950  [facility]) CM/SW Contact:    Gelene Mink, [place] Phone Number: 08/31/2019, 4:25 PM  Clinical Narrative:                  PMT met with the nephew and spoke with the patient's son today. They are agreeable to rehab for the patient.   CSW called and spoke with Ryan Lane, patient's nephew. CSW introduced herself and explained her role. She asked if they were agreeable to SNF. He stated he was going to differ decision making to the patient's son Ryan Lane. He provided Jamie's phone number, [phone].   CSW called and spoke with the patient's son, Ryan Lane. He is agreeable to SNF. CSW obtained his email to provide the CMS SNF List.   CSW faxed the patient out and will need to provide bed offers to the patient's family. CSW will continue to follow and assist as needed.   Expected Discharge Plan: Skilled Nursing Facility Barriers to Discharge: Continued Medical Work up   Patient Goals and CMS Choice Patient states their goals for this hospitalization and ongoing recovery are:: Pt son wants rehab for his father CMS Medicare.gov Compare Post Acute Care list provided to:: Patient Represenative (must comment) Choice offered to / list presented to : Adult Children  Expected Discharge Plan and Services Expected Discharge Plan: [place] In-house Referral: Clinical Social Work Discharge Planning Services: NA Post Acute Care Choice: [place] Living arrangements for the past 2 months: Single Family Home                 DME Arranged: N/A DME Agency: NA       HH Arranged: NA [place] Agency: NA        Prior Living Arrangements/Services Living arrangements for the past 2 months: [place] with:: Self Patient language and need for interpreter reviewed:: No Do you feel safe going back to the place where  you live?: No   Pt should not be home alone  Need for Family Participation in Patient Care: Yes (Comment) Care giver support system in place?: Yes (comment)   Criminal Activity/Legal Involvement Pertinent to Current Situation/Hospitalization: No - Comment as needed  Activities of Daily Living      Permission Sought/Granted Permission sought to share information with : Case Manager Permission granted to share information with : Yes, Verbal Permission Granted  Share Information with NAME: Ryan Lane and Ryan Lane  Permission granted to share info w AGENCY: All SNF  Permission granted to share info w Relationship: Nephew and Son     Emotional Assessment Appearance:: Appears stated age Attitude/Demeanor/Rapport: Unable to Assess Affect (typically observed): Unable to Assess Orientation: : Oriented to Self, Oriented to Situation, Oriented to Place, Fluctuating Orientation (Suspected and/or reported Sundowners) Alcohol / Substance Use: Not Applicable Psych Involvement: No (comment)  Admission diagnosis:  PNEUMONIA CVA Patient Active Problem List   Diagnosis Date Noted  . Dyspnea and respiratory abnormalities   . Palliative care encounter   . MRSA bacteremia 08/28/2019  . Alcohol withdrawal ([place]) 08/28/2019  . Acute CVA (cerebrovascular accident) ([place]) 08/28/2019  . Hyponatremia 08/28/2019   PCP:  Patient, No Pcp Per Pharmacy:   [id number] [place] [facility] [place] [address]Phone: [phone]: [phone]    Social Determinants of Health (SDOH) Interventions  Readmission Risk Interventions No flowsheet data found.

## 2019-08-31 NOTE — Progress Notes (Signed)
  Speech Language Pathology Treatment: Dysphagia  Patient Details Name: Ryan Lane MRN: [id number] DOB: 05/30/1951 Today's Date: 08/31/2019 Time: 5361-4431 SLP Time Calculation (min) (ACUTE ONLY): 26 min  Assessment / Plan / Recommendation Clinical Impression  RN reports Palliative consult, pt made DNR at this time. Pt is more awake and vocal. His speech is only 25% intelligible at the word level. He currently has a NG for nutrition and has been requiring deep throat suctioning to clear pharyngeal secretions. Pt repositioned and oral care completed. Ice chips presented with no s/sx of aspiration. After several trials of ice chips pt coughed secretions to mouth. Tsp size sips revealed poor oral control, anterior loss on left side as well as throat clearing and wet vocal quality. Applesauce tolerate well with prolonged bolus formation and oral holding, but clear oral cavity. No throat clear or cough. Recommend continuing trials of ice chips after good oral care. ST will determine readiness for instrumental evaluation.    HPI HPI: Pt is a 68 y.o. M with significant PMH of alcohol and polysubstance abuse who presents to ER with fever, chills, body aches and diarrhea. Admitted with pneumonia/bacteremia with MRSA, transferred to [facility] on 08/28/2019 for further treatment and possible TEE. CT chest/abd/pelvis showing left psoas intramuscular hematoma with evidence of small active bleed. 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       SLP Plan  Continue with current plan of care;MBS       Recommendations  Diet recommendations: NPO Medication Administration: Via alternative means                Oral Care Recommendations: Oral care QID;Oral care prior to ice chip/H20 SLP Visit Diagnosis: Dysphagia, oropharyngeal phase (R13.12) Plan: Continue with current plan of care;MBS       GO              Wynelle Bourgeois., MA, CCC-SLP 08/31/2019, 2:03 PM

## 2019-08-31 NOTE — Progress Notes (Addendum)
NEURO HOSPITALIST PROGRESS NOTE   Subjective: Patient more awake and alert today.  NGT in place. NAD Patient was made DNR today. TEE is scheduled for tomorrow. Patient continues to have bacteremia  being  Treated with vancomycin.   Exam: Vitals:   08/31/19 0730 08/31/19 0801  BP: (!) 156/71   Pulse:    Resp: (!) 33   Temp:  99.1 F (37.3 C)  SpO2:      Physical Exam  Constitutional: Appears well-developed and well-nourished.  Psych: Affect appropriate to situation Eyes: Normal external eye and conjunctiva. HENT: Normocephalic, no lesions, without obvious abnormality.   Musculoskeletal-no joint tenderness, deformity or swelling Cardiovascular: Normal rate and regular rhythm.  Respiratory: Effort normal, non-labored breathing saturations WNL on 3 L [place] GI: Soft.  No distension. There is no tenderness.  Skin: WDI   Neuro:  Mental Status:  seems slightly more alert today, though still drowsy.oriented to name, thought content appropriate.  Patient speech is still garbled/ dysarthric today. able to follow simple commands Cranial Nerves: II: blinks to threat inconsistently. III,IV, VI: ptosis not present, extra-ocular motions intact bilaterally pupils equal, round, reactive to light  V,VII: smile symmetric, facial light touch sensation normal bilaterally VIII: hearing normal bilaterally IX,X: uvula rises symmetrically XI: bilateral shoulder shrug XII: midline tongue extension Motor: Able to move all 4 extremities anti-gravity Tone and bulk:normal tone throughout; no atrophy noted Sensory:  light touch intact throughout, bilaterally Plantars: down going bilaterally Reflexes: 2+ biceps and patella Cerebellar: Unable to test Gait: deferred    Medications:  Scheduled: . feeding supplement (OSMOLITE 1.5 CAL)  1,000 mL Per Tube Q24H  . feeding supplement (PRO-STAT SUGAR FREE 64)  30 mL Per Tube BID  . folic acid  1 mg Oral Daily  . LORazepam  0-4 mg  Intravenous Q12H  . multivitamin with minerals  1 tablet Per Tube Daily  . thiamine  100 mg Oral Daily   Or  . thiamine  100 mg Intravenous Daily   Continuous: . dextrose 5 % and 0.45 % NaCl with KCl 10 mEq/L 125 mL/hr at 08/31/19 0433  . folic acid (FOLVITE) IVPB 1 mg (08/30/19 1016)  . potassium chloride 10 mEq (08/31/19 0952)  . vancomycin 1,000 mg (08/31/19 0402)   MPN:[id number] **OR** acetaminophen (TYLENOL) oral liquid 160 mg/5 mL **OR** acetaminophen  Pertinent Labs/Diagnostics:  No pertinent labs today  rEEG 08/28/2019  IMPRESSION: This study is suggestive of mild diffuse encephalopathy, non specific to etiology. No seizures or epileptiform discharges were seen throughout the recording.  The excessive beta activity seen in the background is most likely due to the effect of benzodiazepine and is a benign EEG pattern.  No results found.    Impression: 68 yo M with AMS in the setting of MRSA bacteremia.  It was suspected that his presentation represents  persistent septic encephalopathy vs meningitis. An LP wouldnt really change management much given that he is already on treatment for MRSA bacteremia with vancomycin at a dose that would be adequate coverage. Patient was seen by neurology at [place] and there they thought the stroke is most likely embolic.  Wernicke's encephalopathy would be another consideration given history of alcohol abuse, was started on thiamine. B1 level was sent and is pending. Suspicion for this is not terribly high but he will receive standard repletion.  EEG was also obtained which was negative for  epileptiform discharges.  I would like to get another MRI brain to make sure patient has not had any new stroke due to  presence of a cerebellar infarct on his previous MRI is highly suspicious for possible embolic disease second toe undiagnosed endocarditis ( images not reviewed).   We will also order an MRI C-spine and T-spine to look for spinal  abscess however on exam patient does not really have significant hyperreflexia or sensory level to suggest that.   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  Agree with obtaining TEE to evaluate for endocarditis.  -obtain MRI with/without contrast to assess if patient has new infarcts secondary septic emboli - MRI cervial and T-spine to look for spinal abscess  Other active problems: Left psoas IM hematoma with evidence of small active bleed (  lovenox was stopped). Sigmoid diverticulosis, bilateral pulmonary densities.  Valentina Lucks, MSN, NP-C [facility] [phone]  Attending neurologist's note to follow  08/31/2019, 10:41 AM   NEUROHOSPITALIST ADDENDUM Performed a face to face diagnostic evaluation.   I have reviewed the contents of history and physical exam as documented by PA/ARNP/Resident and agree with above documentation.  I have discussed and formulated the above plan as documented. Edits to the note have been made as needed.  Patient appears to be more alert when compared to yesterday and is answering questions.  He moves all 4 extremities antigravity and has fairly good strength in both lower legs.  He does have neck rigidity so high suspicion for meningitis which he is already being treated for.  He has significant dysarthria, which I do not have a good explanation for other than possible meningitis versus another infarct.  Due to bacteremia that appears to be persistent despite treatment with vancomycin, hospitalist has high suspicion for either spinal abscess versus endocarditis and I agree with evaluating for both.  Regarding his neurological prognostication, I think it is too early to make a prognosis.  He appears  to be fairly alert and has good motor strength so his biggest issue appears to be significant dysarthria which may get better upon treatment of his underlying infection, and repeat MRI brain would be useful to rule out if he is having new stroke that could change prognosis.  Georgiana Spinner Aroor MD [facility] [phone]   If 7pm to 7am, please call on call as listed on AMION.

## 2019-08-31 NOTE — Progress Notes (Signed)
Daily Progress Note   Patient Name: Ryan Lane       Date: 08/31/2019 DOB: 1951/06/13  Age: 68 y.o. MRN#: [id number] Attending Physician: Darliss Cheney, MD Primary Care Physician: Patient, No Pcp Per Admit Date: 08/28/2019  Reason for Consultation/Follow-up: Establishing goals of care and Psychosocial/spiritual support  Subjective: Discussed with Dr. Lorraine Lax.  Spoke with patient at bedside.  He is more alert today and his speech is a little more understandable.    Per RN he is requiring frequent deep suctioning of secretions.  Spoke with Son Cortland Crehan who confirmed DNR this morning.  He and patient's nephew Rush Landmark appear to be jointly making decisions. Roselyn Reef describes his father as very independent.  He would not want to be dependent on others to do for him.  He would not want to live long term in a nursing home.  Met Bill at bedside.  RN and I gave Rush Landmark an update.  Bill and I privately discussed goals.  Rush Landmark would like to give the antibiotics a chance to work and get him to a SNF for rehab.  He feels patient would not want a feeding tube if needed.  He was going to speak with Roselyn Reef.    Assessment: Mental status appears improved.  Can't hold head straight up.  Lots of secretions.  Appears very ill   Patient Profile/HPI:  68 y.o. male  with past medical history of polysubstance abuse who was admitted from [facility] (where he was admitted on 11/7) on 08/28/2019 with MRSA bacteremia,  RML pneumonia, cerebellar stroke and persistent encephalopathy. TEE at [facility] was unrevealing.  He had a fall at [facility] and has a left psoas hematoma.  Imaging shows bilateral pulmonary densities which are described as cavitary in the RUL.  Cor Trak was placed for nutrition and the patient  aspirated on tube feeds.    Length of Stay: 3  Current Medications: Scheduled Meds:  . feeding supplement (OSMOLITE 1.5 CAL)  1,000 mL Per Tube Q24H  . feeding supplement (PRO-STAT SUGAR FREE 64)  30 mL Per Tube BID  . folic acid  1 mg Oral Daily  . LORazepam  0-4 mg Intravenous Q12H  . multivitamin with minerals  1 tablet Per Tube Daily  . thiamine  100 mg Oral Daily   Or  . thiamine  100 mg Intravenous Daily    Continuous Infusions: . dextrose 5 % and 0.45 % NaCl with KCl 10 mEq/L 125 mL/hr at 08/31/19 0433  . folic acid (FOLVITE) IVPB 1 mg (08/30/19 1016)  . potassium chloride 10 mEq (08/31/19 1153)  . vancomycin 1,000 mg (08/31/19 0402)    PRN Meds: acetaminophen **OR** acetaminophen (TYLENOL) oral liquid 160 mg/5 mL **OR** acetaminophen, haloperidol lactate  Physical Exam        Well developed male, appears ill, right sclera reddened, head hanging left CV tachy resp coarse breath sounds, but no distress on 3L Abdomen soft, nt, nd  Vital Signs: BP (!) 148/66 (BP Location: Right Arm)   Pulse (!) 118   Temp (!) 100.5 F (38.1 C) (Oral)   Resp 19   Wt 63.8 kg   SpO2 96%  SpO2: SpO2: 96 % O2 Device: O2 Device: Nasal Cannula O2 Flow Rate: O2 Flow Rate (L/min): 3 L/min  Intake/output summary:   Intake/Output Summary (Last 24 hours) at 08/31/2019 1331 Last data filed at 08/31/2019 1610 Gross per 24 hour  Intake -  Output 1800 ml  Net -1800 ml   LBM: Last BM Date: 08/30/19 Baseline Weight: Weight: 61.8 kg Most recent weight: Weight: 63.8 kg       Palliative Assessment/Data:  20%      Patient Active Problem List   Diagnosis Date Noted  . Dyspnea and respiratory abnormalities   . Palliative care encounter   . MRSA bacteremia 08/28/2019  . Alcohol withdrawal ([place]) 08/28/2019  . Acute CVA (cerebrovascular accident) ([place]) 08/28/2019  . Hyponatremia 08/28/2019    Palliative Care Plan    Recommendations/Plan:  Changed code status to DNR.  Family  feels he would not want a PEG tube but will have more discussion  Continue current treatment.  Family agreeable for TEE.  Family hopeful for SNF Rehab.  Quality of life (per Rush Landmark) would be being able to walk and do some things for himself.  Goals of Care and Additional Recommendations:  Limitations on Scope of Treatment: Full Scope Treatment  Code Status:  DNR  Prognosis:   Unable to determine.  Patient with severe MRSA infection, aspirating, stroke and lung opacities possibly from infective endocarditis and septic emboli.   Discharge Planning:  To Be Determined.  Best case SNF rehab.  Care plan was discussed with family, RN, attending.  Thank you for allowing the Palliative Medicine Team to assist in the care of this patient.  Total time spent:  35 min.     Greater than 50%  of this time was spent counseling and coordinating care related to the above assessment and plan.  Florentina Jenny, PA-C Palliative Medicine  Please contact Palliative MedicineTeam phone at [phone] for questions and concerns between 7 am - 7 pm.   Please see AMION for individual provider pager numbers.

## 2019-08-31 NOTE — Progress Notes (Signed)
Pharmacy Antibiotic Note  Ryan Lane is a 68 y.o. male admitted on 08/28/2019 with MRSA bacteremia.  Pharmacy has been consulted for Vancomycin dosing.    Vancomycin levels collected: VP = 27 and VT = 13; calculated AUC = 464.1  WBC remains elevated at 25.6 with low grade fever Per primary team - will order repeat blood cultures and obtain TEE   Plan: Continue vancomycin 1000 mg IV q12hr Monitor renal function, clinical status, repeat blood cultures, TEE, and vanc levels  Weight: 140 lb 10.5 oz (63.8 kg)  Temp (24hrs), Avg:99 F (37.2 C), Min:98 F (36.7 C), Max:99.9 F (37.7 C)  Recent Labs  Lab 08/28/19 0245 08/28/19 0510 08/28/19 0525 08/28/19 0627 08/28/19 0952 08/28/19 1403 08/29/19 0724 08/30/19 1800 08/30/19 1913 08/31/19 0336  WBC 29.1*  --   --  31.7*  --   --  31.9*  --  24.8* 25.6*  CREATININE 0.56* 0.72  --   --  0.70 0.83 0.81  --   --  0.58*  LATICACIDVEN  --   --  2.1* 2.2*  --   --   --   --   --   --   VANCOTROUGH  --   --   --   --   --   --   --   --   --  13*  VANCOPEAK  --   --   --   --   --   --   --  27*  --   --     CrCl cannot be calculated (Unknown ideal weight.).    No Known Allergies  Antimicrobials this admission: Vanc 11/12 >>  Dose adjustments: 11/15: VP=27, VT=13; AUC 464.1 - cont 1000 q12h  Microbiology: 11/12 bcx - MRSA 11/12 UCx - ngF  Thank you for allowing pharmacy to be a part of this patient's care.  Vertis Kelch, PharmD PGY2 Cardiology Pharmacy Resident Phone [phone] 08/31/2019       11:17 AM  Please check [email or web address] for unit-specific pharmacist phone numbers

## 2019-08-31 NOTE — Progress Notes (Deleted)
CSW providing taxi voucher to the patient for safe discharge home.   Please re-consult if any new needs arise.   Domenic Schwab, MSW, [place] Worker [facility]  [phone]

## 2019-08-31 NOTE — Progress Notes (Signed)
Potassium 3.0, Dr. Sloan Leiter informed.

## 2019-08-31 NOTE — NC FL2 (Signed)
[place] MEDICAID FL2 LEVEL OF CARE SCREENING TOOL     IDENTIFICATION  Patient Name: Ryan Lane Birthdate: 1951/08/31 Sex: male Admission Date (Current Location): 08/28/2019  [facility] and [place] Number:  [employer] and Address:  [facility], [address] [place], [place] [address]      Provider Number: [id number]  Attending Physician Name and Address:  Darliss Cheney, MD  Relative Name and Phone Number:  Matteo, Ban[place], [phone] & [address] [place], [phone]    Current Level of Care: Hospital Recommended Level of Care: [place] Prior Approval Number:    Date Approved/Denied: 08/31/19 PASRR Number: [id number]  Discharge Plan: SNF    Current Diagnoses: Patient Active Problem List   Diagnosis Date Noted  . Dyspnea and respiratory abnormalities   . Palliative care encounter   . MRSA bacteremia 08/28/2019  . Alcohol withdrawal ([place]) 08/28/2019  . Acute CVA (cerebrovascular accident) ([place]) 08/28/2019  . Hyponatremia 08/28/2019    Orientation RESPIRATION BLADDER Height & Weight     Self, Situation, Place  O2(99, [place], 3L) Incontinent, External catheter Weight: 140 lb 10.5 oz (63.8 kg) Height:     BEHAVIORAL SYMPTOMS/MOOD NEUROLOGICAL BOWEL NUTRITION STATUS      Incontinent Feeding tube(Cortrak osmolite 1.2 CAL 1,088ml)  AMBULATORY STATUS COMMUNICATION OF NEEDS Skin   Total Care Verbally Other (Comment), Bruising(Rash on right arm/left leg and bruising on right arm/left leg)                       Personal Care Assistance Level of Assistance  Bathing, Feeding, Dressing, Total care Bathing Assistance: Maximum assistance Feeding assistance: Maximum assistance Dressing Assistance: Maximum assistance Total Care Assistance: Maximum assistance   Functional Limitations Info  Sight, Hearing, Speech Sight Info: Adequate Hearing Info: Adequate Speech Info: Adequate    SPECIAL CARE FACTORS  FREQUENCY  PT (By licensed PT), OT (By licensed OT), Speech therapy     PT Frequency: 5x/wk OT Frequency: 5/wk     Speech Therapy Frequency: 3x/wk for 2 weeks      Contractures Contractures Info: Not present    Additional Factors Info  Code Status, Allergies Code Status Info: DNR Allergies Info: No Known Allergies           Current Medications (08/31/2019):  This is the current hospital active medication list Current Facility-Administered Medications  Medication Dose Route Frequency Provider Last Rate Last Dose  . acetaminophen (TYLENOL) tablet 650 mg  650 mg Oral Q4H PRN Rise Patience, MD       Or  . acetaminophen (TYLENOL) 160 MG/5ML solution 650 mg  650 mg Per Tube Q4H PRN Rise Patience, MD   650 mg at 08/31/19 1246   Or  . acetaminophen (TYLENOL) suppository 650 mg  650 mg Rectal Q4H PRN Rise Patience, MD   650 mg at 08/28/19 1010  . dextrose 5 % and 0.45 % NaCl with KCl 10 mEq/L infusion   Intravenous Continuous Roxan Hockey, MD 125 mL/hr at 08/31/19 0433    . feeding supplement (OSMOLITE 1.5 CAL) liquid 1,000 mL  1,000 mL Per Tube Q24H Emokpae, Courage, MD 55 mL/hr at 08/31/19 1605 1,000 mL at 08/31/19 1605  . feeding supplement (PRO-STAT SUGAR FREE 64) liquid 30 mL  30 mL Per Tube BID Denton Brick, Courage, MD   30 mL at 08/31/19 0852  . folic acid 1 mg in sodium chloride 0.9 % 50 mL IVPB  1 mg Intravenous  Daily Veryl Speak, FNP 100.4 mL/hr at 08/30/19 1016 1 mg at 08/30/19 1016   Or  . folic acid (FOLVITE) tablet 1 mg  1 mg Oral Daily Veryl Speak, FNP   1 mg at 08/31/19 3810  . Gerhardt's butt cream   Topical Daily Pahwani, Ravi, MD      . haloperidol lactate (HALDOL) injection 2 mg  2 mg Intravenous Once PRN Dellinger, Tora Kindred, PA-C      . LORazepam (ATIVAN) injection 0-4 mg  0-4 mg Intravenous Q12H Eduard Clos, MD   1 mg at 08/30/19 1614  . multivitamin with minerals tablet 1 tablet  1 tablet Per Tube Daily Shon Hale,  MD   1 tablet at 08/31/19 0853  . thiamine (VITAMIN B-1) tablet 100 mg  100 mg Oral Daily Eduard Clos, MD       Or  . thiamine (B-1) injection 100 mg  100 mg Intravenous Daily Eduard Clos, MD   100 mg at 08/31/19 0853  . vancomycin (VANCOCIN) IVPB 1000 mg/200 mL premix  1,000 mg Intravenous Q12H Eduard Clos, MD 200 mL/hr at 08/31/19 0402 1,000 mg at 08/31/19 0402     Discharge Medications: Please see discharge summary for a list of discharge medications.  Relevant Imaging Results:  Relevant Lab Results:   Additional Information SSN: [id number]  Nada Boozer Estill Llerena, LCSWA

## 2019-08-31 NOTE — Social Work (Signed)
CSW acknowledging consult for substance use. At current time pt not appropriate for assessment given he is documented as only oriented to self and currently in mittens.  CSW continuing to follow for support with disposition when medically appropriate.  Westley Hummer, MSW, [place] Work [phone]

## 2019-08-31 NOTE — Progress Notes (Signed)
PROGRESS NOTE    Ryan ApleyRichard Lane  ZOX:[id number] DOB: Mar 09, 1951 DOA: 08/28/2019 PCP: Patient, No Pcp Per   Brief Narrative:  68 y.o.malewith a history of alcohol abuse who was brought to [facility] on November 7 and found to have pneumonia/bacteremia with MRSA as well as stroke involving the right cerebellar and right corona radiata and acute oblique encephalopathy transferred [facility] 08/28/2019 for further treatment and possible TEE EEG and TTE at [facility] were largely unremarkable.  Seen by neurology here.  They think his stroke was likely embolic.  MRI with and without contrast is being repeated by them.  Palliative care was consulted to discuss goals of care.  Per their discussion with the family, family wants to proceed with TEE.  Assessment & Plan:   Principal Problem:   MRSA bacteremia Active Problems:   Alcohol withdrawal (HCC)   Acute CVA (cerebrovascular accident) (HCC)   Hyponatremia   Dyspnea and respiratory abnormalities   Palliative care encounter   1) Left Psoas intramuscular hematoma with evidence of small active bleed--- stop subcu Lovenox, stop aspirin, -Continue to monitor serial H&H and transfuse as clinically indicated -Hemoglobin remains a stable. -Discussed with radiologist no specific intervention required at this time - 2) acute toxic/metabolic encephalopathy--- in the setting of MRSA bacteremia and pneumonia as well as a stroke.  Continue IV vancomycin.  Repeat EEG here did not show epileptiform findings.  He may be having Wernicke's encephalopathy component as well.  Seen by neurology and they are obtaining another MRI with and without contrast brain.--   3)Cerebellar CVA--MRI from [facility] with acute stroke (punctate cerebellar infarct/ right cerebellar and right corona radiata )----- may be embolic, however unable to give aspirin due to #1 above.  Neurology has ordered repeat MRI of the brain.  4)Sepsis secondary to  MRSA Bacteremia and pneumonia--- -4/4 aerobic bottles positive for GPCs identified as Staph aureus and is MEC A positive  WBC remains elevated but he remains afebrile.continue IV vancomycin, repeat blood cultures from 08/28/2019 + again.  We will repeat another blood culture sent today.  Palliative care discussed in length with the family.  They desire to proceed with TEE.  I have consulted on-call cardiology for this to be scheduled for tomorrow.  5)Polysubstance Abuse--- as per patient's nephew Mr. Annette StableBill Essick--patient has history of methamphetamine, THC alcohol and other illicit drug use.  Social worker on board.  6)Social/Ethics--patient is estranged from his 2 sons over the years due to polysubstance abuse, patient sister Harriett Sineancy and patient's nephew Helane RimaBill Essick [phone] his primary contact  7) dysphagia/diet--- place CorTrac/Keofeed tube for feeding purposes, -tube feeding per dietitian/nutritionist  DVT prophylaxis: SCD Code Status: Full code Family Communication:  None present at bedside.  Palliative care on board and having regular discussions with the family. Disposition Plan: To be determined  There is no height or weight on file to calculate BMI.      Nutritional status:  Nutrition Problem: Inadequate oral intake Etiology: inability to eat   Signs/Symptoms: NPO status   Interventions: Refer to RD note for recommendations    Consultants:   Neurology and now cardiology  Procedures:   None  Antimicrobials:   IV vancomycin   Subjective: Patient seen and examined.  He is quite lethargic.  Speech very slurred.  Seems to be disoriented.  Objective: Vitals:   08/31/19 0341 08/31/19 0400 08/31/19 0730 08/31/19 0801  BP:  (!) 145/62 (!) 156/71   Pulse:  (!) 118  Resp:  (!) 30 (!) 33   Temp:  99.1 F (37.3 C)  99.1 F (37.3 C)  TempSrc:  Axillary  Oral  SpO2:  95%    Weight: 63.8 kg       Intake/Output Summary (Last 24 hours) at 08/31/2019 0924  Last data filed at 08/31/2019 5284 Gross per 24 hour  Intake -  Output 2000 ml  Net -2000 ml   Filed Weights   08/29/19 0359 08/30/19 0424 08/31/19 0341  Weight: 61.3 kg 63.9 kg 63.8 kg    Examination:  General exam: Appears toxic and sick looking. Respiratory system: Rhonchi at the bases bilaterally Cardiovascular system: S1 & S2 heard, RRR. No JVD, murmurs, rubs, gallops or clicks. No pedal edema. Gastrointestinal system: Abdomen is nondistended, soft and nontender. No organomegaly or masses felt. Normal bowel sounds heard. Central nervous system: Lethargic and disoriented.  Right upper extremity weakness.  Moving rest of the extremities. Skin: No rashes, lesions or ulcers Psychiatry: Unable to assess due to being lethargic.   Data Reviewed: I have personally reviewed following labs and imaging studies  CBC: Recent Labs  Lab 08/28/19 0245 08/28/19 0627  08/28/19 2034 08/29/19 0204 08/29/19 0724 08/30/19 1913 08/31/19 0336  WBC 29.1* 31.7*  --   --   --  31.9* 24.8* 25.6*  NEUTROABS 25.9*  --   --   --   --   --   --   --   HGB 14.4 15.4   < > 13.4 14.3 11.6* 11.8* 11.1*  HCT 42.0 45.3   < > 38.9* 42.6 34.4* 34.3* 32.2*  MCV 93.1 96.0  --   --   --  96.4 96.9 96.7  PLT 191 199  --   --   --  195 204 218   < > = values in this interval not displayed.   Basic Metabolic Panel: Recent Labs  Lab 08/28/19 0245 08/28/19 0510 08/28/19 0952 08/28/19 1403 08/29/19 0724 08/31/19 0336  NA 148* 149* 151* 148* 147* 147*  K 3.4* 3.6 3.6 3.6 3.5 3.0*  CL 112* 113* 113* 111 110 110  CO2 27 25 27 27 29 27   GLUCOSE 136* 121* 117* 121* 160* 110*  BUN 9 11 11 14 12 9   CREATININE 0.56* 0.72 0.70 0.83 0.81 0.58*  CALCIUM 8.6* 8.8* 8.7* 8.6* 8.0* 8.1*  MG 1.7  --   --   --   --   --    GFR: CrCl cannot be calculated (Unknown ideal weight.). Liver Function Tests: Recent Labs  Lab 08/28/19 0245 08/29/19 0724  AST 32 28  ALT 23 16  ALKPHOS 79 77  BILITOT 1.2 0.7  PROT  5.6* 5.2*  ALBUMIN 2.0* 1.7*   No results for input(s): LIPASE, AMYLASE in the last 168 hours. Recent Labs  Lab 08/28/19 0509  AMMONIA 45*   Coagulation Profile: No results for input(s): INR, PROTIME in the last 168 hours. Cardiac Enzymes: Recent Labs  Lab 08/28/19 0510  CKTOTAL 36*   BNP (last 3 results) No results for input(s): PROBNP in the last 8760 hours. HbA1C: No results for input(s): HGBA1C in the last 72 hours. CBG: Recent Labs  Lab 08/30/19 1148 08/30/19 1603 08/30/19 2013 08/30/19 2332 08/31/19 0356  GLUCAP 119* 140* 102* 104* 114*   Lipid Profile: No results for input(s): CHOL, HDL, LDLCALC, TRIG, CHOLHDL, LDLDIRECT in the last 72 hours. Thyroid Function Tests: No results for input(s): TSH, T4TOTAL, FREET4, T3FREE, THYROIDAB in the last 72 hours.  Anemia Panel: No results for input(s): VITAMINB12, FOLATE, FERRITIN, TIBC, IRON, RETICCTPCT in the last 72 hours. Sepsis Labs: Recent Labs  Lab 08/28/19 0525 08/28/19 0627  LATICACIDVEN 2.1* 2.2*    Recent Results (from the past 240 hour(s))  Culture, Urine     Status: None   Collection Time: 08/28/19  1:07 AM   Specimen: Urine, Catheterized  Result Value Ref Range Status   Specimen Description URINE, CATHETERIZED  Final   Special Requests Normal  Final   Culture   Final    NO GROWTH Performed at [facility], [address] [place], [place] [address]    Report Status 08/31/2019 FINAL  Final  Culture, blood (routine x 2)     Status: Abnormal   Collection Time: 08/28/19  4:52 PM   Specimen: BLOOD LEFT HAND  Result Value Ref Range Status   Specimen Description BLOOD LEFT HAND  Final   Special Requests AEROBIC BOTTLE ONLY Blood Culture adequate volume  Final   Culture  Setup Time   Final    AEROBIC BOTTLE ONLY GRAM POSITIVE COCCI CRITICAL RESULT CALLED TO, READ BACK BY AND VERIFIED WITH: C PIERCE PHARMD 08/30/19 0514 JDW    Culture (A)  Final    STAPHYLOCOCCUS AUREUS SUSCEPTIBILITIES  PERFORMED ON PREVIOUS CULTURE WITHIN THE LAST 5 DAYS. Performed at [facility], [address] [place], [place] [address]    Report Status 08/31/2019 FINAL  Final  Culture, blood (routine x 2)     Status: Abnormal   Collection Time: 08/28/19  4:52 PM   Specimen: BLOOD LEFT HAND  Result Value Ref Range Status   Specimen Description BLOOD LEFT HAND  Final   Special Requests AEROBIC BOTTLE ONLY Blood Culture adequate volume  Final   Culture  Setup Time   Final    GRAM POSITIVE COCCI AEROBIC BOTTLE ONLY CRITICAL VALUE NOTED.  VALUE IS CONSISTENT WITH PREVIOUSLY REPORTED AND CALLED VALUE.    Culture (A)  Final    STAPHYLOCOCCUS AUREUS SUSCEPTIBILITIES PERFORMED ON PREVIOUS CULTURE WITHIN THE LAST 5 DAYS. Performed at [facility], [address] [place], [place] [address]    Report Status 08/31/2019 FINAL  Final  Blood Culture ID Panel (Reflexed)     Status: Abnormal   Collection Time: 08/28/19  4:52 PM  Result Value Ref Range Status   Enterococcus species NOT DETECTED NOT DETECTED Final   Listeria monocytogenes NOT DETECTED NOT DETECTED Final   Staphylococcus species DETECTED (A) NOT DETECTED Final    Comment: CRITICAL RESULT CALLED TO, READ BACK BY AND VERIFIED WITH: C PIERCE PHARMD 08/30/19 0514 JDW    Staphylococcus aureus (BCID) DETECTED (A) NOT DETECTED Final    Comment: Methicillin (oxacillin)-resistant Staphylococcus aureus (MRSA). MRSA is predictably resistant to beta-lactam antibiotics (except ceftaroline). Preferred therapy is vancomycin unless clinically contraindicated. Patient requires contact precautions if  hospitalized. CRITICAL RESULT CALLED TO, READ BACK BY AND VERIFIED WITH: C PIERCE PHARMD 08/30/19 0514 JDW    Methicillin resistance DETECTED (A) NOT DETECTED Final    Comment: CRITICAL RESULT CALLED TO, READ BACK BY AND VERIFIED WITH: C PIERCE PHARMD 08/30/19 0514 JDW    Streptococcus species NOT DETECTED NOT DETECTED Final   Streptococcus  agalactiae NOT DETECTED NOT DETECTED Final   Streptococcus pneumoniae NOT DETECTED NOT DETECTED Final   Streptococcus pyogenes NOT DETECTED NOT DETECTED Final   Acinetobacter baumannii NOT DETECTED NOT DETECTED Final   Enterobacteriaceae species NOT DETECTED NOT DETECTED Final   Enterobacter  cloacae complex NOT DETECTED NOT DETECTED Final   Escherichia coli NOT DETECTED NOT DETECTED Final   Klebsiella oxytoca NOT DETECTED NOT DETECTED Final   Klebsiella pneumoniae NOT DETECTED NOT DETECTED Final   Proteus species NOT DETECTED NOT DETECTED Final   Serratia marcescens NOT DETECTED NOT DETECTED Final   Haemophilus influenzae NOT DETECTED NOT DETECTED Final   Neisseria meningitidis NOT DETECTED NOT DETECTED Final   Pseudomonas aeruginosa NOT DETECTED NOT DETECTED Final   Candida albicans NOT DETECTED NOT DETECTED Final   Candida glabrata NOT DETECTED NOT DETECTED Final   Candida krusei NOT DETECTED NOT DETECTED Final   Candida parapsilosis NOT DETECTED NOT DETECTED Final   Candida tropicalis NOT DETECTED NOT DETECTED Final    Comment: Performed at [facility], [address], [place], [place] [address]  Culture, blood (routine x 2)     Status: Abnormal   Collection Time: 08/28/19  8:34 PM   Specimen: BLOOD LEFT HAND  Result Value Ref Range Status   Specimen Description BLOOD LEFT HAND  Final   Special Requests   Final    BOTTLES DRAWN AEROBIC ONLY Blood Culture results may not be optimal due to an inadequate volume of blood received in culture bottles   Culture  Setup Time   Final    AEROBIC BOTTLE ONLY GRAM POSITIVE COCCI CRITICAL VALUE NOTED.  VALUE IS CONSISTENT WITH PREVIOUSLY REPORTED AND CALLED VALUE.    Culture (A)  Final    STAPHYLOCOCCUS AUREUS SUSCEPTIBILITIES PERFORMED ON PREVIOUS CULTURE WITHIN THE LAST 5 DAYS. Performed at [facility], [address] [place], [place] [address]    Report Status 08/31/2019 FINAL  Final  Culture, blood (routine x 2)      Status: Abnormal   Collection Time: 08/28/19  8:34 PM   Specimen: BLOOD RIGHT HAND  Result Value Ref Range Status   Specimen Description BLOOD RIGHT HAND  Final   Special Requests   Final    BOTTLES DRAWN AEROBIC ONLY Blood Culture results may not be optimal due to an inadequate volume of blood received in culture bottles   Culture  Setup Time   Final    GRAM POSITIVE COCCI IN CLUSTERS AEROBIC BOTTLE ONLY CRITICAL RESULT CALLED TO, READ BACK BY AND VERIFIED WITH: PHARMD CHRIS [place] [id number] FCP Performed at [facility], [address], [place], [place] [address]    Culture METHICILLIN RESISTANT STAPHYLOCOCCUS AUREUS (A)  Final   Report Status 08/31/2019 FINAL  Final   Organism ID, Bacteria METHICILLIN RESISTANT STAPHYLOCOCCUS AUREUS  Final      Susceptibility   Methicillin resistant staphylococcus aureus - MIC*    CIPROFLOXACIN >=8 RESISTANT Resistant     ERYTHROMYCIN >=8 RESISTANT Resistant     GENTAMICIN <=0.5 SENSITIVE Sensitive     OXACILLIN >=4 RESISTANT Resistant     TETRACYCLINE <=1 SENSITIVE Sensitive     VANCOMYCIN 1 SENSITIVE Sensitive     TRIMETH/SULFA <=10 SENSITIVE Sensitive     CLINDAMYCIN <=0.25 SENSITIVE Sensitive     RIFAMPIN <=0.5 SENSITIVE Sensitive     Inducible Clindamycin NEGATIVE Sensitive     * METHICILLIN RESISTANT STAPHYLOCOCCUS AUREUS      Radiology Studies: No results found.  Scheduled Meds: . feeding supplement (OSMOLITE 1.5 CAL)  1,000 mL Per Tube Q24H  . feeding supplement (PRO-STAT SUGAR FREE 64)  30 mL Per Tube BID  . folic acid  1 mg Oral Daily  . LORazepam  0-4 mg Intravenous Q12H  .  multivitamin with minerals  1 tablet Per Tube Daily  . thiamine  100 mg Oral Daily   Or  . thiamine  100 mg Intravenous Daily   Continuous Infusions: . dextrose 5 % and 0.45 % NaCl with KCl 10 mEq/L 125 mL/hr at 08/31/19 0433  . folic acid (FOLVITE) IVPB 1 mg (08/30/19 1016)  . potassium chloride 10 mEq (08/31/19 0851)  . vancomycin 1,000 mg  (08/31/19 0402)     LOS: 3 days   Time spent: 35 minutes   Hughie Closs, MD [facility]  08/31/2019, 9:24 AM   To contact the attending provider between 7A-7P or the covering provider during after hours 7P-7A, please log into the web site [email or web address] and use password [email or web address].

## 2019-09-01 LAB — COMPREHENSIVE METABOLIC PANEL
ALT: 38 U/L (ref 0–44)
AST: 50 U/L — ABNORMAL HIGH (ref 15–41)
Albumin: 1.5 g/dL — ABNORMAL LOW (ref 3.5–5.0)
Alkaline Phosphatase: 75 U/L (ref 38–126)
Anion gap: 9 (ref 5–15)
BUN: 8 mg/dL (ref 8–23)
CO2: 28 mmol/L (ref 22–32)
Calcium: 8 mg/dL — ABNORMAL LOW (ref 8.9–10.3)
Chloride: 104 mmol/L (ref 98–111)
Creatinine, Ser: 0.5 mg/dL — ABNORMAL LOW (ref 0.61–1.24)
GFR calc Af Amer: >60 mL/min (ref >60)
GFR calc non Af Amer: >60 mL/min (ref >60)
Glucose, Bld: 121 mg/dL — ABNORMAL HIGH (ref 70–99)
Potassium: 3.2 mmol/L — ABNORMAL LOW (ref 3.5–5.1)
Sodium: 141 mmol/L (ref 135–145)
Total Bilirubin: 0.8 mg/dL (ref 0.3–1.2)
Total Protein: 5.7 g/dL — ABNORMAL LOW (ref 6.5–8.1)

## 2019-09-01 LAB — GLUCOSE, CAPILLARY
Glucose-Capillary: 104 mg/dL — ABNORMAL HIGH (ref 70–99)
Glucose-Capillary: 108 mg/dL — ABNORMAL HIGH (ref 70–99)
Glucose-Capillary: 113 mg/dL — ABNORMAL HIGH (ref 70–99)
Glucose-Capillary: 117 mg/dL — ABNORMAL HIGH (ref 70–99)
Glucose-Capillary: 119 mg/dL — ABNORMAL HIGH (ref 70–99)
Glucose-Capillary: 157 mg/dL — ABNORMAL HIGH (ref 70–99)

## 2019-09-01 LAB — CBC WITH DIFFERENTIAL/PLATELET
Abs Immature Granulocytes: 0.24 10*3/uL — ABNORMAL HIGH (ref 0.00–0.07)
Basophils Absolute: 0.1 10*3/uL (ref 0.0–0.1)
Basophils Relative: 0 %
Eosinophils Absolute: 0.1 10*3/uL (ref 0.0–0.5)
Eosinophils Relative: 1 %
HCT: 32 % — ABNORMAL LOW (ref 39.0–52.0)
Hemoglobin: 10.7 g/dL — ABNORMAL LOW (ref 13.0–17.0)
Immature Granulocytes: 1 %
Lymphocytes Relative: 7 %
Lymphs Abs: 1.5 10*3/uL (ref 0.7–4.0)
MCH: 32.5 pg (ref 26.0–34.0)
MCHC: 33.4 g/dL (ref 30.0–36.0)
MCV: 97.3 fL (ref 80.0–100.0)
Monocytes Absolute: 1 10*3/uL (ref 0.1–1.0)
Monocytes Relative: 5 %
Neutro Abs: 18.1 10*3/uL — ABNORMAL HIGH (ref 1.7–7.7)
Neutrophils Relative %: 86 %
Platelets: 281 10*3/uL (ref 150–400)
RBC: 3.29 MIL/uL — ABNORMAL LOW (ref 4.22–5.81)
RDW: 13.4 % (ref 11.5–15.5)
WBC: 21.1 10*3/uL — ABNORMAL HIGH (ref 4.0–10.5)
nRBC: 0 % (ref 0.0–0.2)

## 2019-09-01 LAB — MAGNESIUM: Magnesium: 1.9 mg/dL (ref 1.7–2.4)

## 2019-09-01 MED ORDER — SODIUM CHLORIDE 0.9 % IV SOLN
INTRAVENOUS | Status: DC | PRN
  Administered 2019-09-01: 250 mL via INTRAVENOUS
  Administered 2019-09-07: 10:00:00 1000 mL via INTRAVENOUS

## 2019-09-01 MED ORDER — POTASSIUM CHLORIDE 10 MEQ/100ML IV SOLN
10.0000 meq | INTRAVENOUS | Status: AC
  Administered 2019-09-01 (×5): 10 meq via INTRAVENOUS
  Filled 2019-09-01 (×5): qty 100

## 2019-09-01 MED ORDER — LORAZEPAM 2 MG/ML IJ SOLN
1.0000 mg | Freq: Once | INTRAMUSCULAR | Status: DC
  Filled 2019-09-01: qty 1

## 2019-09-01 MED ORDER — POTASSIUM CHLORIDE 10 MEQ/100ML IV SOLN
10.0000 meq | Freq: Once | INTRAVENOUS | Status: AC
  Administered 2019-09-01: 10 meq via INTRAVENOUS
  Filled 2019-09-01: qty 100

## 2019-09-01 NOTE — Progress Notes (Addendum)
PROGRESS NOTE    Ryan Lane  MOQ:[id number] DOB: [birth year] DOA: 08/28/2019 PCP: Patient, No Pcp Per   Brief Narrative:  68 y.o.malewith a history of alcohol abuse who was brought to [facility] on November 7 and found to have pneumonia/bacteremia with MRSA as well as stroke involving the right cerebellar and right corona radiata and acute oblique encephalopathy transferred to [facility] on 08/28/2019 for further treatment and possible TEE EEG and TTE at [facility] were largely unremarkable.  Seen by neurology here.  They think his stroke was likely embolic.  MRI with and without contrast is being repeated by them.  Palliative care was consulted to discuss goals of care.  Per their discussion with the family, family wants to proceed with TEE. We are also trying to get MRI cervical and thoracic spine to rule out cervical abscess.  Patient has been having persistent bacteremia with recent blood culture positive from 08/28/2019.  Repeat blood culture from 09/01/2019 are in process.  Assessment & Plan:   Principal Problem:   MRSA bacteremia Active Problems:   Alcohol withdrawal (HCC)   Acute CVA (cerebrovascular accident) (HCC)   Hyponatremia   Dyspnea and respiratory abnormalities   Palliative care encounter   1) Left Psoas intramuscular hematoma with evidence of small active bleed: His subcutaneous Lovenox and aspirin were discontinued.  His hemoglobin is trending down but very slowly.  Repeat CBC in the morning.  If not big of a drop, will prefer to resume his aspirin due to his recent acute ischemic stroke. - 2) acute toxic/metabolic encephalopathy--- in the setting of MRSA bacteremia and pneumonia as well as a stroke.  Continue IV vancomycin.  Repeat EEG here did not show epileptiform findings.  He may be having Wernicke's encephalopathy component as well.  Seen by neurology and they are obtaining another MRI with and without contrast brain.  3)Cerebellar CVA--MRI  from [facility] with acute stroke (punctate cerebellar infarct/ right cerebellar and right corona radiata )----- may be embolic, however unable to give aspirin due to #1 above.  Neurology has ordered repeat MRI of the brain.  4)Sepsis secondary to MRSA Bacteremia and pneumonia--- -4/4 aerobic bottles positive for GPCs identified as Staph aureus and is MEC A positive  WBC remains elevated but he remains afebrile.continue IV vancomycin, repeat blood cultures from 08/28/2019 + again.  We will repeat another blood culture sent today.  Palliative care discussed in length with the family.  They desire to proceed with TEE.  I had consulted on-call cardiology on 08/31/2019 for this to be scheduled for 09/01/2019 however per neurology PA, this has been rescheduled for 09/02/2019.  We are also trying to get MRI of the cervical and thoracic spine as there is high suspicion of possible cervical abscess.  5)Polysubstance Abuse--- as per patient's nephew [name]--patient has history of methamphetamine, THC alcohol and other illicit drug use.  Social worker on board.  6)Social/Ethics--patient is estranged from his 2 sons over the years due to polysubstance abuse, patient sister [name] and patient's nephew [name] [phone] his primary contact  7) dysphagia/diet--- place CorTrac/Keofeed tube for feeding purposes, -tube feeding per dietitian/nutritionist  8.  Hypokalemia: Replaced through IV.  Recheck in the morning.  DVT prophylaxis: SCD Code Status: DNR Family Communication:  None present at bedside.  Palliative care on board and having regular discussions with the family and per their discussion with family on 08/31/2019, patient was made DNR. Disposition Plan: To be determined  There is no height  or weight on file to calculate BMI.      Nutritional status:  Nutrition Problem: Inadequate oral intake Etiology: inability to eat   Signs/Symptoms: NPO status   Interventions:  Refer to RD note for recommendations    Consultants:   Neurology and ID  Procedures:   None  Antimicrobials:   IV vancomycin   Subjective: Patient seen and examined.  Once again, he is laying in the bed with his head turned to the left.  Interestingly, he is alert and oriented but has significant dysarthria and a lot of secretions in his pharynx indicating he has poor control on his swallowing.  Objective: Vitals:   09/01/19 0900 09/01/19 1000 09/01/19 1100 09/01/19 1200  BP:    (!) 143/65  Pulse:      Resp: 18 18 20 20   Temp:    98.6 F (37 C)  TempSrc:    Oral  SpO2:      Weight:        Intake/Output Summary (Last 24 hours) at 09/01/2019 1307 Last data filed at 09/01/2019 1200 Gross per 24 hour  Intake 8763.74 ml  Output -  Net 8763.74 ml   Filed Weights   08/29/19 0359 08/30/19 0424 08/31/19 0341  Weight: 61.3 kg 63.9 kg 63.8 kg    Examination:  General exam: Appears sick Respiratory system: Pharyngeal gurgling with rhonchi bilaterally. Cardiovascular system: S1 & S2 heard, RRR. No JVD, murmurs, rubs, gallops or clicks. No pedal edema. Gastrointestinal system: Abdomen is nondistended, soft and nontender. No organomegaly or masses felt. Normal bowel sounds heard. Central nervous system: Alert and oriented.  Global weakness, more on the right upper extremity than any other extremity. Extremities: Symmetric 5 x 5 power. Skin: No rashes, lesions or ulcers.  Psychiatry: Judgement and insight appear poor. Mood & affect flat.  Data Reviewed: I have personally reviewed following labs and imaging studies  CBC: Recent Labs  Lab 08/28/19 0245 08/28/19 0627  08/29/19 0204 08/29/19 0724 08/30/19 1913 08/31/19 0336 09/01/19 0908  WBC 29.1* 31.7*  --   --  31.9* 24.8* 25.6* 21.1*  NEUTROABS 25.9*  --   --   --   --   --   --  18.1*  HGB 14.4 15.4   < > 14.3 11.6* 11.8* 11.1* 10.7*  HCT 42.0 45.3   < > 42.6 34.4* 34.3* 32.2* 32.0*  MCV 93.1 96.0  --   --   96.4 96.9 96.7 97.3  PLT 191 199  --   --  195 204 218 281   < > = values in this interval not displayed.   Basic Metabolic Panel: Recent Labs  Lab 08/28/19 0245  08/28/19 0952 08/28/19 1403 08/29/19 0724 08/31/19 0336 09/01/19 0908  NA 148*   < > 151* 148* 147* 147* 141  K 3.4*   < > 3.6 3.6 3.5 3.0* 3.2*  CL 112*   < > 113* 111 110 110 104  CO2 27   < > 27 27 29 27 28   GLUCOSE 136*   < > 117* 121* 160* 110* 121*  BUN 9   < > 11 14 12 9 8   CREATININE 0.56*   < > 0.70 0.83 0.81 0.58* 0.50*  CALCIUM 8.6*   < > 8.7* 8.6* 8.0* 8.1* 8.0*  MG 1.7  --   --   --   --   --  1.9   < > = values in this interval not displayed.   GFR: CrCl cannot  be calculated (Unknown ideal weight.). Liver Function Tests: Recent Labs  Lab 08/28/19 0245 08/29/19 0724 09/01/19 0908  AST 32 28 50*  ALT 23 16 38  ALKPHOS 79 77 75  BILITOT 1.2 0.7 0.8  PROT 5.6* 5.2* 5.7*  ALBUMIN 2.0* 1.7* 1.5*   No results for input(s): LIPASE, AMYLASE in the last 168 hours. Recent Labs  Lab 08/28/19 0509  AMMONIA 45*   Coagulation Profile: No results for input(s): INR, PROTIME in the last 168 hours. Cardiac Enzymes: Recent Labs  Lab 08/28/19 0510  CKTOTAL 36*   BNP (last 3 results) No results for input(s): PROBNP in the last 8760 hours. HbA1C: No results for input(s): HGBA1C in the last 72 hours. CBG: Recent Labs  Lab 08/31/19 1124 08/31/19 1705 08/31/19 2054 08/31/19 2336 09/01/19 0357  GLUCAP 114* 95 100* 95 108*   Lipid Profile: No results for input(s): CHOL, HDL, LDLCALC, TRIG, CHOLHDL, LDLDIRECT in the last 72 hours. Thyroid Function Tests: No results for input(s): TSH, T4TOTAL, FREET4, T3FREE, THYROIDAB in the last 72 hours. Anemia Panel: No results for input(s): VITAMINB12, FOLATE, FERRITIN, TIBC, IRON, RETICCTPCT in the last 72 hours. Sepsis Labs: Recent Labs  Lab 08/28/19 0525 08/28/19 0627  LATICACIDVEN 2.1* 2.2*    Recent Results (from the past 240 hour(s))  Culture,  Urine     Status: None   Collection Time: 08/28/19  1:07 AM   Specimen: Urine, Catheterized  Result Value Ref Range Status   Specimen Description URINE, CATHETERIZED  Final   Special Requests Normal  Final   Culture   Final    NO GROWTH Performed at [facility], [address] [place], [place] [address]    Report Status 08/31/2019 FINAL  Final  Culture, blood (routine x 2)     Status: Abnormal   Collection Time: 08/28/19  4:52 PM   Specimen: BLOOD LEFT HAND  Result Value Ref Range Status   Specimen Description BLOOD LEFT HAND  Final   Special Requests AEROBIC BOTTLE ONLY Blood Culture adequate volume  Final   Culture  Setup Time   Final    AEROBIC BOTTLE ONLY GRAM POSITIVE COCCI CRITICAL RESULT CALLED TO, READ BACK BY AND VERIFIED WITH: C PIERCE PHARMD 08/30/19 0514 JDW    Culture (A)  Final    STAPHYLOCOCCUS AUREUS SUSCEPTIBILITIES PERFORMED ON PREVIOUS CULTURE WITHIN THE LAST 5 DAYS. Performed at [facility], [address] [place], [place] [address]    Report Status 08/31/2019 FINAL  Final  Culture, blood (routine x 2)     Status: Abnormal   Collection Time: 08/28/19  4:52 PM   Specimen: BLOOD LEFT HAND  Result Value Ref Range Status   Specimen Description BLOOD LEFT HAND  Final   Special Requests AEROBIC BOTTLE ONLY Blood Culture adequate volume  Final   Culture  Setup Time   Final    GRAM POSITIVE COCCI AEROBIC BOTTLE ONLY CRITICAL VALUE NOTED.  VALUE IS CONSISTENT WITH PREVIOUSLY REPORTED AND CALLED VALUE.    Culture (A)  Final    STAPHYLOCOCCUS AUREUS SUSCEPTIBILITIES PERFORMED ON PREVIOUS CULTURE WITHIN THE LAST 5 DAYS. Performed at [facility], [address] [place], [place] [address]    Report Status 08/31/2019 FINAL  Final  Blood Culture ID Panel (Reflexed)     Status: Abnormal   Collection Time: 08/28/19  4:52 PM  Result Value Ref Range Status   Enterococcus species NOT DETECTED NOT DETECTED Final   Listeria monocytogenes NOT  DETECTED NOT DETECTED Final   Staphylococcus species DETECTED (A) NOT DETECTED Final    Comment: CRITICAL RESULT CALLED TO, READ BACK BY AND VERIFIED WITH: C PIERCE PHARMD 08/30/19 0514 JDW    Staphylococcus aureus (BCID) DETECTED (A) NOT DETECTED Final    Comment: Methicillin (oxacillin)-resistant Staphylococcus aureus (MRSA). MRSA is predictably resistant to beta-lactam antibiotics (except ceftaroline). Preferred therapy is vancomycin unless clinically contraindicated. Patient requires contact precautions if  hospitalized. CRITICAL RESULT CALLED TO, READ BACK BY AND VERIFIED WITH: C PIERCE PHARMD 08/30/19 0514 JDW    Methicillin resistance DETECTED (A) NOT DETECTED Final    Comment: CRITICAL RESULT CALLED TO, READ BACK BY AND VERIFIED WITH: C PIERCE PHARMD 08/30/19 0514 JDW    Streptococcus species NOT DETECTED NOT DETECTED Final   Streptococcus agalactiae NOT DETECTED NOT DETECTED Final   Streptococcus pneumoniae NOT DETECTED NOT DETECTED Final   Streptococcus pyogenes NOT DETECTED NOT DETECTED Final   Acinetobacter baumannii NOT DETECTED NOT DETECTED Final   Enterobacteriaceae species NOT DETECTED NOT DETECTED Final   Enterobacter cloacae complex NOT DETECTED NOT DETECTED Final   Escherichia coli NOT DETECTED NOT DETECTED Final   Klebsiella oxytoca NOT DETECTED NOT DETECTED Final   Klebsiella pneumoniae NOT DETECTED NOT DETECTED Final   Proteus species NOT DETECTED NOT DETECTED Final   Serratia marcescens NOT DETECTED NOT DETECTED Final   Haemophilus influenzae NOT DETECTED NOT DETECTED Final   Neisseria meningitidis NOT DETECTED NOT DETECTED Final   Pseudomonas aeruginosa NOT DETECTED NOT DETECTED Final   Candida albicans NOT DETECTED NOT DETECTED Final   Candida glabrata NOT DETECTED NOT DETECTED Final   Candida krusei NOT DETECTED NOT DETECTED Final   Candida parapsilosis NOT DETECTED NOT DETECTED Final   Candida tropicalis NOT DETECTED NOT DETECTED Final    Comment:  Performed at [facility], [address], [place], [place] [address]  Culture, blood (routine x 2)     Status: Abnormal   Collection Time: 08/28/19  8:34 PM   Specimen: BLOOD LEFT HAND  Result Value Ref Range Status   Specimen Description BLOOD LEFT HAND  Final   Special Requests   Final    BOTTLES DRAWN AEROBIC ONLY Blood Culture results may not be optimal due to an inadequate volume of blood received in culture bottles   Culture  Setup Time   Final    AEROBIC BOTTLE ONLY GRAM POSITIVE COCCI CRITICAL VALUE NOTED.  VALUE IS CONSISTENT WITH PREVIOUSLY REPORTED AND CALLED VALUE.    Culture (A)  Final    STAPHYLOCOCCUS AUREUS SUSCEPTIBILITIES PERFORMED ON PREVIOUS CULTURE WITHIN THE LAST 5 DAYS. Performed at [facility], [address] [place], [place] [address]    Report Status 08/31/2019 FINAL  Final  Culture, blood (routine x 2)     Status: Abnormal   Collection Time: 08/28/19  8:34 PM   Specimen: BLOOD RIGHT HAND  Result Value Ref Range Status   Specimen Description BLOOD RIGHT HAND  Final   Special Requests   Final    BOTTLES DRAWN AEROBIC ONLY Blood Culture results may not be optimal due to an inadequate volume of blood received in culture bottles   Culture  Setup Time   Final    GRAM POSITIVE COCCI IN CLUSTERS AEROBIC BOTTLE ONLY CRITICAL RESULT CALLED TO, READ BACK BY AND VERIFIED WITH: PHARMD CHRIS WALSTON [id number] FCP Performed at [facility], [address], [place], [place] [address]    Culture METHICILLIN RESISTANT STAPHYLOCOCCUS AUREUS (  A)  Final   Report Status 08/31/2019 FINAL  Final   Organism ID, Bacteria METHICILLIN RESISTANT STAPHYLOCOCCUS AUREUS  Final      Susceptibility   Methicillin resistant staphylococcus aureus - MIC*    CIPROFLOXACIN >=8 RESISTANT Resistant     ERYTHROMYCIN >=8 RESISTANT Resistant     GENTAMICIN <=0.5 SENSITIVE Sensitive     OXACILLIN >=4 RESISTANT Resistant     TETRACYCLINE <=1 SENSITIVE Sensitive      VANCOMYCIN 1 SENSITIVE Sensitive     TRIMETH/SULFA <=10 SENSITIVE Sensitive     CLINDAMYCIN <=0.25 SENSITIVE Sensitive     RIFAMPIN <=0.5 SENSITIVE Sensitive     Inducible Clindamycin NEGATIVE Sensitive     * METHICILLIN RESISTANT STAPHYLOCOCCUS AUREUS      Radiology Studies: Dg Chest Port 1 View  Result Date: 08/31/2019 CLINICAL DATA:  Dyspnea and respiratory abnormalities. EXAM: PORTABLE CHEST 1 VIEW COMPARISON:  08/22/2019 and chest, abdomen pelvis CT dated 08/28/2019. FINDINGS: Interval large left pleural effusion with online a small amount of aerated left lung remaining. Rotation of the upper chest to the left with evidence of interval mediastinal shift to the left as well. Significantly increased patchy opacity with areas of cavitation in the right upper and mid lung zones. Diffuse osteopenia. Feeding tube extending into the stomach. IMPRESSION: 1. Interval large left pleural effusion with only a small amount of aerated left lung remaining. 2. Interval mediastinal shift to the left, most likely due to a large central mucous plug and diffuse left lung atelectasis. 3. Significantly increased patchy opacity with areas of cavitation in the right upper and mid lung zones compatible with significant progression of cavitary infection. Electronically Signed   By: Beckie Salts M.D.   On: 08/31/2019 16:15    Scheduled Meds: . feeding supplement (OSMOLITE 1.5 CAL)  1,000 mL Per Tube Q24H  . feeding supplement (PRO-STAT SUGAR FREE 64)  30 mL Per Tube BID  . folic acid  1 mg Oral Daily  . Gerhardt's butt cream   Topical Daily  . LORazepam  1 mg Intravenous Once  . multivitamin with minerals  1 tablet Per Tube Daily  . thiamine  100 mg Oral Daily   Or  . thiamine  100 mg Intravenous Daily   Continuous Infusions: . sodium chloride Stopped (09/01/19 0422)  . dextrose 5 % and 0.45 % NaCl with KCl 10 mEq/L 125 mL/hr at 09/01/19 0600  . folic acid (FOLVITE) IVPB 1 mg (08/30/19 1016)  . vancomycin  200 mL/hr at 09/01/19 0600     LOS: 4 days   Time spent: 29 minutes   Hughie Closs, MD [facility]  09/01/2019, 1:07 PM   To contact the attending provider between 7A-7P or the covering provider during after hours 7P-7A, please log into the web site [email or web address] and use password [email or web address].

## 2019-09-01 NOTE — Progress Notes (Signed)
[facility]  Date of Admission:  08/28/2019     Total days of antibiotics 5         ASSESSMENT:  Mr. Dugo has persistent MRSA bacteremia and leukocytosis with cultures from 11/12 being positive. Given burden of disease this is not surprising with septic emboli in the lungs and CNS. Repeat blood cultures drawn today. Will pursue TEE per palliative care note. Clinically he appears to be improved some today. Renal function stable. Continue current dose of vancomycin. If blood cultures remain positive may need to consider additional imaging.  PLAN:  1. Continue vancomycin. 2. Await TEE results.  3. Repeat blood cultures in process.   Principal Problem:   MRSA bacteremia Active Problems:   Alcohol withdrawal (HCC)   Acute CVA (cerebrovascular accident) (HCC)   Hyponatremia   Dyspnea and respiratory abnormalities   Palliative care encounter   . feeding supplement (OSMOLITE 1.5 CAL)  1,000 mL Per Tube Q24H  . feeding supplement (PRO-STAT SUGAR FREE 64)  30 mL Per Tube BID  . folic acid  1 mg Oral Daily  . Gerhardt's butt cream   Topical Daily  . LORazepam  1 mg Intravenous Once  . multivitamin with minerals  1 tablet Per Tube Daily  . thiamine  100 mg Oral Daily   Or  . thiamine  100 mg Intravenous Daily    SUBJECTIVE:  Febrile yesterday with max temperature of 100.5 in the last 24 hours with slowly improving leukocytosis. Mr. Szymborski is thirsty and would like a drink.   No Known Allergies   Review of Systems: Review of Systems  Constitutional: Negative for chills, fever and weight loss.  Respiratory: Negative for cough, shortness of breath and wheezing.   Cardiovascular: Negative for chest pain and leg swelling.  Gastrointestinal: Negative for abdominal pain, constipation, diarrhea, nausea and vomiting.  Skin: Negative for rash.      OBJECTIVE: Vitals:   09/01/19 0800 09/01/19 0900 09/01/19 1000 09/01/19 1100  BP:      Pulse:       Resp: 18 18 18 20   Temp:      TempSrc:      SpO2:      Weight:       There is no height or weight on file to calculate BMI.  Physical Exam Constitutional:      General: He is not in acute distress.    Appearance: He is well-developed.     Comments: Lying sideways in the bed; appears older than stated age.   Cardiovascular:     Rate and Rhythm: Normal rate and regular rhythm.     Heart sounds: Normal heart sounds.  Pulmonary:     Effort: Pulmonary effort is normal.     Breath sounds: Normal breath sounds.  Skin:    General: Skin is warm and dry.  Neurological:     Mental Status: He is alert.     Lab Results Lab Results  Component Value Date   WBC 21.1 (H) 09/01/2019   HGB 10.7 (L) 09/01/2019   HCT 32.0 (L) 09/01/2019   MCV 97.3 09/01/2019   PLT 281 09/01/2019    Lab Results  Component Value Date   CREATININE 0.50 (L) 09/01/2019   BUN 8 09/01/2019   NA 141 09/01/2019   K 3.2 (L) 09/01/2019   CL 104 09/01/2019   CO2 28 09/01/2019    Lab Results  Component Value Date   ALT 38 09/01/2019  AST 50 (H) 09/01/2019   ALKPHOS 75 09/01/2019   BILITOT 0.8 09/01/2019     Microbiology: Recent Results (from the past 240 hour(s))  Culture, Urine     Status: None   Collection Time: 08/28/19  1:07 AM   Specimen: Urine, Catheterized  Result Value Ref Range Status   Specimen Description URINE, CATHETERIZED  Final   Special Requests Normal  Final   Culture   Final    NO GROWTH Performed at [facility], [address] [place], [place] [address]    Report Status 08/31/2019 FINAL  Final  Culture, blood (routine x 2)     Status: Abnormal   Collection Time: 08/28/19  4:52 PM   Specimen: BLOOD LEFT HAND  Result Value Ref Range Status   Specimen Description BLOOD LEFT HAND  Final   Special Requests AEROBIC BOTTLE ONLY Blood Culture adequate volume  Final   Culture  Setup Time   Final    AEROBIC BOTTLE ONLY GRAM POSITIVE COCCI CRITICAL RESULT CALLED TO, READ BACK  BY AND VERIFIED WITH: C PIERCE PHARMD 08/30/19 0514 JDW    Culture (A)  Final    STAPHYLOCOCCUS AUREUS SUSCEPTIBILITIES PERFORMED ON PREVIOUS CULTURE WITHIN THE LAST 5 DAYS. Performed at [facility], [address] [place], [place] [address]    Report Status 08/31/2019 FINAL  Final  Culture, blood (routine x 2)     Status: Abnormal   Collection Time: 08/28/19  4:52 PM   Specimen: BLOOD LEFT HAND  Result Value Ref Range Status   Specimen Description BLOOD LEFT HAND  Final   Special Requests AEROBIC BOTTLE ONLY Blood Culture adequate volume  Final   Culture  Setup Time   Final    GRAM POSITIVE COCCI AEROBIC BOTTLE ONLY CRITICAL VALUE NOTED.  VALUE IS CONSISTENT WITH PREVIOUSLY REPORTED AND CALLED VALUE.    Culture (A)  Final    STAPHYLOCOCCUS AUREUS SUSCEPTIBILITIES PERFORMED ON PREVIOUS CULTURE WITHIN THE LAST 5 DAYS. Performed at [facility], [address] [place], [place] [address]    Report Status 08/31/2019 FINAL  Final  Blood Culture ID Panel (Reflexed)     Status: Abnormal   Collection Time: 08/28/19  4:52 PM  Result Value Ref Range Status   Enterococcus species NOT DETECTED NOT DETECTED Final   Listeria monocytogenes NOT DETECTED NOT DETECTED Final   Staphylococcus species DETECTED (A) NOT DETECTED Final    Comment: CRITICAL RESULT CALLED TO, READ BACK BY AND VERIFIED WITH: C PIERCE PHARMD 08/30/19 0514 JDW    Staphylococcus aureus (BCID) DETECTED (A) NOT DETECTED Final    Comment: Methicillin (oxacillin)-resistant Staphylococcus aureus (MRSA). MRSA is predictably resistant to beta-lactam antibiotics (except ceftaroline). Preferred therapy is vancomycin unless clinically contraindicated. Patient requires contact precautions if  hospitalized. CRITICAL RESULT CALLED TO, READ BACK BY AND VERIFIED WITH: C PIERCE PHARMD 08/30/19 0514 JDW    Methicillin resistance DETECTED (A) NOT DETECTED Final    Comment: CRITICAL RESULT CALLED TO, READ BACK BY AND VERIFIED  WITH: C PIERCE PHARMD 08/30/19 0514 JDW    Streptococcus species NOT DETECTED NOT DETECTED Final   Streptococcus agalactiae NOT DETECTED NOT DETECTED Final   Streptococcus pneumoniae NOT DETECTED NOT DETECTED Final   Streptococcus pyogenes NOT DETECTED NOT DETECTED Final   Acinetobacter baumannii NOT DETECTED NOT DETECTED Final   Enterobacteriaceae species NOT DETECTED NOT DETECTED Final   Enterobacter cloacae complex NOT DETECTED NOT DETECTED Final   Escherichia coli NOT DETECTED NOT DETECTED  Final   Klebsiella oxytoca NOT DETECTED NOT DETECTED Final   Klebsiella pneumoniae NOT DETECTED NOT DETECTED Final   Proteus species NOT DETECTED NOT DETECTED Final   Serratia marcescens NOT DETECTED NOT DETECTED Final   Haemophilus influenzae NOT DETECTED NOT DETECTED Final   Neisseria meningitidis NOT DETECTED NOT DETECTED Final   Pseudomonas aeruginosa NOT DETECTED NOT DETECTED Final   Candida albicans NOT DETECTED NOT DETECTED Final   Candida glabrata NOT DETECTED NOT DETECTED Final   Candida krusei NOT DETECTED NOT DETECTED Final   Candida parapsilosis NOT DETECTED NOT DETECTED Final   Candida tropicalis NOT DETECTED NOT DETECTED Final    Comment: Performed at [facility], [place] [address] [place], [place] [address]  Culture, blood (routine x 2)     Status: Abnormal   Collection Time: 08/28/19  8:34 PM   Specimen: BLOOD LEFT HAND  Result Value Ref Range Status   Specimen Description BLOOD LEFT HAND  Final   Special Requests   Final    BOTTLES DRAWN AEROBIC ONLY Blood Culture results may not be optimal due to an inadequate volume of blood received in culture bottles   Culture  Setup Time   Final    AEROBIC BOTTLE ONLY GRAM POSITIVE COCCI CRITICAL VALUE NOTED.  VALUE IS CONSISTENT WITH PREVIOUSLY REPORTED AND CALLED VALUE.    Culture (A)  Final    STAPHYLOCOCCUS AUREUS SUSCEPTIBILITIES PERFORMED ON PREVIOUS CULTURE WITHIN THE LAST 5 DAYS. Performed at [facility], [place] [address], [place], [place] [address]    Report Status 08/31/2019 FINAL  Final  Culture, blood (routine x 2)     Status: Abnormal   Collection Time: 08/28/19  8:34 PM   Specimen: BLOOD RIGHT HAND  Result Value Ref Range Status   Specimen Description BLOOD RIGHT HAND  Final   Special Requests   Final    BOTTLES DRAWN AEROBIC ONLY Blood Culture results may not be optimal due to an inadequate volume of blood received in culture bottles   Culture  Setup Time   Final    GRAM POSITIVE COCCI IN CLUSTERS AEROBIC BOTTLE ONLY CRITICAL RESULT CALLED TO, READ BACK BY AND VERIFIED WITH: PHARMD CHRIS Canadian [phone] FCP Performed at [facility] [address] [place], [place] [address]    Culture METHICILLIN RESISTANT STAPHYLOCOCCUS AUREUS (A)  Final   Report Status 08/31/2019 FINAL  Final   Organism ID, Bacteria METHICILLIN RESISTANT STAPHYLOCOCCUS AUREUS  Final      Susceptibility   Methicillin resistant staphylococcus aureus - MIC*    CIPROFLOXACIN >=8 RESISTANT Resistant     ERYTHROMYCIN >=8 RESISTANT Resistant     GENTAMICIN <=0.5 SENSITIVE Sensitive     OXACILLIN >=4 RESISTANT Resistant     TETRACYCLINE <=1 SENSITIVE Sensitive     VANCOMYCIN 1 SENSITIVE Sensitive     TRIMETH/SULFA <=10 SENSITIVE Sensitive     CLINDAMYCIN <=0.25 SENSITIVE Sensitive     RIFAMPIN <=0.5 SENSITIVE Sensitive     Inducible Clindamycin NEGATIVE Sensitive     * METHICILLIN RESISTANT STAPHYLOCOCCUS AUREUS     Terri Piedra, NP [facility] [phone] Pager  09/01/2019  12:39 PM

## 2019-09-01 NOTE — Progress Notes (Signed)
SLP Cancellation Note  Patient Details Name: Syris Brookens MRN: [id number] DOB: [birth year]   Cancelled treatment:       Reason Eval/Treat Not Completed: Medical issues which prohibited therapy - per chart, pt NPO pending possible TEE today. Will f/u for PO trials as able.   Venita Sheffield Ceyda Peterka 09/01/2019, 9:12 AM  Pollyann Glen, M.A. [place] Acute Environmental education officer [phone] Office [phone]

## 2019-09-01 NOTE — Anesthesia Preprocedure Evaluation (Addendum)
Anesthesia Evaluation  Patient identified by MRN, date of birth, ID band Patient awake    Reviewed: Allergy & Precautions, NPO status , Patient's Chart, lab work & pertinent test results  History of Anesthesia Complications Negative for: history of anesthetic complications  Airway Mallampati: II  TM Distance: >3 FB Neck ROM: Full    Dental   Pulmonary Current Smoker and Patient abstained from smoking.,    Pulmonary exam normal        Cardiovascular negative cardio ROS Normal cardiovascular exam     Neuro/Psych CVA, Residual Symptoms negative psych ROS   GI/Hepatic negative GI ROS, (+)     substance abuse  alcohol use, marijuana use and methamphetamine use,   Endo/Other  negative endocrine ROS  Renal/GU negative Renal ROS  negative genitourinary   Musculoskeletal negative musculoskeletal ROS (+)   Abdominal   Peds  Hematology  (+) anemia ,   Anesthesia Other Findings Admitted with MRSA PNA/bacteremia and stroke believed to be embolic; possible meningitis and/or cervical abscess  Reproductive/Obstetrics                            Anesthesia Physical Anesthesia Plan  ASA: III  Anesthesia Plan: MAC   Post-op Pain Management:    Induction: Intravenous  PONV Risk Score and Plan: 1 and Propofol infusion, TIVA and Treatment may vary due to age or medical condition  Airway Management Planned: Natural Airway, Nasal Cannula and Simple Face Mask  Additional Equipment: None  Intra-op Plan:   Post-operative Plan:   Informed Consent: I have reviewed the patients History and Physical, chart, labs and discussed the procedure including the risks, benefits and alternatives for the proposed anesthesia with the patient or authorized representative who has indicated his/her understanding and acceptance.   Patient has DNR.     Plan Discussed with:   Anesthesia Plan Comments:         Anesthesia Quick Evaluation

## 2019-09-01 NOTE — Progress Notes (Signed)
Physical Therapy Treatment Patient Details Name: Ryan Lane MRN: [id number] DOB: 12-11-1950 Today's Date: 09/01/2019    History of Present Illness 68 y.o. M with significant PMH of alcohol and polysubstance abuse who presents to ER with fever, chills, body aches and diarrhea. Admitted with pneumonia/bacteremia with MRSA, transferred to [facility] on 08/28/2019 for further treatment and possible TEE. CT chest/abd/pelvis showing left psoas intramuscular hematoma with evidence of small active bleed. 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     PT Comments    Pt was seen for attempt to get pt sitting side of bed but due to lethargy was unable to get him to actively assist with movement.  He was assisted to scoot up in bed, to re-establish midline and to increase his control of sitting balance security to allow him to have feeding tube running.  Assisted nursing to replace his mitts as pt is consistently trying to pull NG tube.  Follow acutely as tolerated.  Follow Up Recommendations  SNF     Equipment Recommendations  Other (comment)    Recommendations for Other Services       Precautions / Restrictions Precautions Precautions: Fall Precaution Comments: trying to pull his NG tube Restrictions Weight Bearing Restrictions: No    Mobility  Bed Mobility Overal bed mobility: Needs Assistance Bed Mobility: Rolling(scooting) Rolling: Max assist;+2 for physical assistance;+2 for safety/equipment         General bed mobility comments: requires consistent help to sequence all mobility and postural conrtrol   Transfers Overall transfer level: Needs assistance               General transfer comment: too lethargic to attempt a transfer  Ambulation/Gait                 Stairs             Wheelchair Mobility    Modified Rankin (Stroke Patients Only)       Balance                                            Cognition  Arousal/Alertness: Lethargic Behavior During Therapy: Flat affect Overall Cognitive Status: Impaired/Different from baseline Area of Impairment: Orientation;Attention;Memory;Following commands;Safety/judgement;Awareness;Problem solving                 Orientation Level: Situation;Time;Place Current Attention Level: Alternating Memory: Decreased short-term memory Following Commands: Follows one step commands inconsistently Safety/Judgement: Decreased awareness of safety;Decreased awareness of deficits Awareness: Intellectual;Emergent Problem Solving: Slow processing;Decreased initiation;Requires verbal cues;Requires tactile cues;Difficulty sequencing General Comments: garbled speech but also eyes closed, not moving on command consistently, listing to L side      Exercises      General Comments General comments (skin integrity, edema, etc.): pt was in a slump with legs off the bed when PT arrived, and repositioned him with upright posture and with sitting position to avoid slumping down the bed, corrected neck leaning to L      Pertinent Vitals/Pain Pain Assessment: Faces Faces Pain Scale: No hurt    Home Living                      Prior Function            PT Goals (current goals can now be found in the care plan section) Acute Rehab PT Goals Patient Stated Goal:  unable    Frequency    Min 3X/week      PT Plan Current plan remains appropriate    Co-evaluation              AM-PAC PT "6 Clicks" Mobility   Outcome Measure  Help needed turning from your back to your side while in a flat bed without using bedrails?: Total Help needed moving from lying on your back to sitting on the side of a flat bed without using bedrails?: Total Help needed moving to and from a bed to a chair (including a wheelchair)?: Total Help needed standing up from a chair using your arms (e.g., wheelchair or bedside chair)?: Total Help needed to walk in hospital room?:  Total Help needed climbing 3-5 steps with a railing? : Total 6 Click Score: 6    End of Session Equipment Utilized During Treatment: Oxygen Activity Tolerance: Patient limited by lethargy Patient left: in bed;with call bell/phone within reach;with bed alarm set Nurse Communication: Mobility status PT Visit Diagnosis: Other abnormalities of gait and mobility (R26.89)     Time: 9357-0177 PT Time Calculation (min) (ACUTE ONLY): 26 min  Charges:  $Therapeutic Activity: 23-37 mins                 Ivar Drape 09/01/2019, 7:54 PM   Samul Dada, PT MS Acute Rehab Dept. Number: [facility] [phone] and [facility] [phone]

## 2019-09-01 NOTE — Progress Notes (Signed)
TEE delayed until 11/17 Ok to resume current diet  Modified for NPO after midnight tonight for TEE tomorrow. Etta Quill notified.  Burnetta Sabin, MSN, APRN, ANVP-BC, AGPCNP-BC Advanced Practice Stroke Nurse [place] for Schedule & Pager information 09/01/2019 9:40 AM

## 2019-09-01 NOTE — Care Management Important Message (Signed)
Important Message  Patient Details  Name: Ryan Lane MRN: [id number] Date of Birth: 09-05-1951   Medicare Important Message Given:  Yes     [name] 09/01/2019, 2:58 PM

## 2019-09-01 NOTE — Care Management Important Message (Signed)
Important Message  Patient Details  Name: Ryan Lane MRN: [id number] Date of Birth: 1951-10-02   Medicare Important Message Given:  No  Correction Precaution in place no IM given.   Yedidya Duddy 09/01/2019, 2:59 PM

## 2019-09-01 NOTE — Plan of Care (Signed)
Patient progressing toward plan of care goals. 

## 2019-09-01 NOTE — Progress Notes (Signed)
Physician made aware of need for isolation order and MRSA PCR order.

## 2019-09-02 ENCOUNTER — Encounter (HOSPITAL_COMMUNITY)
Admission: AD | Disposition: A | Discharge status: Skilled Nursing Facility | Payer: Self-pay | Source: Other Acute Inpatient Hospital | Attending: Internal Medicine

## 2019-09-02 ENCOUNTER — Inpatient Hospital Stay (HOSPITAL_COMMUNITY): Payer: Medicare Other | Admitting: Anesthesiology

## 2019-09-02 ENCOUNTER — Encounter (HOSPITAL_COMMUNITY): Payer: Self-pay | Admitting: Emergency Medicine

## 2019-09-02 ENCOUNTER — Inpatient Hospital Stay (HOSPITAL_COMMUNITY): Payer: Medicare Other

## 2019-09-02 DIAGNOSIS — R7881 Bacteremia: Secondary | ICD-10-CM

## 2019-09-02 DIAGNOSIS — Z7189 Other specified counseling: Secondary | ICD-10-CM

## 2019-09-02 DIAGNOSIS — Z515 Encounter for palliative care: Secondary | ICD-10-CM

## 2019-09-02 DIAGNOSIS — J90 Pleural effusion, not elsewhere classified: Secondary | ICD-10-CM

## 2019-09-02 HISTORY — PX: BUBBLE STUDY: SHX6837

## 2019-09-02 HISTORY — PX: TEE WITHOUT CARDIOVERSION: SHX5443

## 2019-09-02 LAB — CBC WITH DIFFERENTIAL/PLATELET
Abs Immature Granulocytes: 0.18 10*3/uL — ABNORMAL HIGH (ref 0.00–0.07)
Basophils Absolute: 0 10*3/uL (ref 0.0–0.1)
Basophils Relative: 0 %
Eosinophils Absolute: 0.1 10*3/uL (ref 0.0–0.5)
Eosinophils Relative: 1 %
HCT: 27.6 % — ABNORMAL LOW (ref 39.0–52.0)
Hemoglobin: 9.4 g/dL — ABNORMAL LOW (ref 13.0–17.0)
Immature Granulocytes: 1 %
Lymphocytes Relative: 10 %
Lymphs Abs: 1.6 10*3/uL (ref 0.7–4.0)
MCH: 32.4 pg (ref 26.0–34.0)
MCHC: 34.1 g/dL (ref 30.0–36.0)
MCV: 95.2 fL (ref 80.0–100.0)
Monocytes Absolute: 1 10*3/uL (ref 0.1–1.0)
Monocytes Relative: 6 %
Neutro Abs: 13.9 10*3/uL — ABNORMAL HIGH (ref 1.7–7.7)
Neutrophils Relative %: 82 %
Platelets: 231 10*3/uL (ref 150–400)
RBC: 2.9 MIL/uL — ABNORMAL LOW (ref 4.22–5.81)
RDW: 13.2 % (ref 11.5–15.5)
WBC: 16.9 10*3/uL — ABNORMAL HIGH (ref 4.0–10.5)
nRBC: 0 % (ref 0.0–0.2)

## 2019-09-02 LAB — GLUCOSE, CAPILLARY
Glucose-Capillary: 100 mg/dL — ABNORMAL HIGH (ref 70–99)
Glucose-Capillary: 100 mg/dL — ABNORMAL HIGH (ref 70–99)
Glucose-Capillary: 102 mg/dL — ABNORMAL HIGH (ref 70–99)
Glucose-Capillary: 102 mg/dL — ABNORMAL HIGH (ref 70–99)
Glucose-Capillary: 109 mg/dL — ABNORMAL HIGH (ref 70–99)
Glucose-Capillary: 110 mg/dL — ABNORMAL HIGH (ref 70–99)
Glucose-Capillary: 121 mg/dL — ABNORMAL HIGH (ref 70–99)

## 2019-09-02 LAB — COMPREHENSIVE METABOLIC PANEL
ALT: 61 U/L — ABNORMAL HIGH (ref 0–44)
AST: 100 U/L — ABNORMAL HIGH (ref 15–41)
Albumin: 1.4 g/dL — ABNORMAL LOW (ref 3.5–5.0)
Alkaline Phosphatase: 81 U/L (ref 38–126)
Anion gap: 8 (ref 5–15)
BUN: 10 mg/dL (ref 8–23)
CO2: 26 mmol/L (ref 22–32)
Calcium: 7.7 mg/dL — ABNORMAL LOW (ref 8.9–10.3)
Chloride: 105 mmol/L (ref 98–111)
Creatinine, Ser: 0.5 mg/dL — ABNORMAL LOW (ref 0.61–1.24)
GFR calc Af Amer: >60 mL/min (ref >60)
GFR calc non Af Amer: >60 mL/min (ref >60)
Glucose, Bld: 106 mg/dL — ABNORMAL HIGH (ref 70–99)
Potassium: 3.7 mmol/L (ref 3.5–5.1)
Sodium: 139 mmol/L (ref 135–145)
Total Bilirubin: 0.7 mg/dL (ref 0.3–1.2)
Total Protein: 5.4 g/dL — ABNORMAL LOW (ref 6.5–8.1)

## 2019-09-02 SURGERY — ECHOCARDIOGRAM, TRANSESOPHAGEAL
Anesthesia: Monitor Anesthesia Care

## 2019-09-02 MED ORDER — SODIUM CHLORIDE 0.9 % IV SOLN
INTRAVENOUS | Status: DC | PRN
  Administered 2019-09-02: 10:00:00 via INTRAVENOUS

## 2019-09-02 MED ORDER — HYDROMORPHONE HCL 1 MG/ML IJ SOLN
1.0000 mg | Freq: Once | INTRAMUSCULAR | Status: AC
  Administered 2019-09-02: 1 mg via INTRAVENOUS
  Filled 2019-09-02: qty 1

## 2019-09-02 MED ORDER — PROPOFOL 500 MG/50ML IV EMUL
INTRAVENOUS | Status: DC | PRN
  Administered 2019-09-02: 50 ug/kg/min via INTRAVENOUS

## 2019-09-02 MED ORDER — LACTATED RINGERS IV SOLN
INTRAVENOUS | Status: AC | PRN
  Administered 2019-09-02: 1000 mL via INTRAVENOUS

## 2019-09-02 MED ORDER — LIDOCAINE 2% (20 MG/ML) 5 ML SYRINGE
INTRAMUSCULAR | Status: DC | PRN
  Administered 2019-09-02: 40 mg via INTRAVENOUS

## 2019-09-02 NOTE — Progress Notes (Signed)
Call placed to primary RN regarding TEE this AM at 0730. Per primary RN patient does not have consent for TEE and is unable to sign own consent at this time. Advised patient may need to be rescheduled for later today if consent can not be obtained. Primary RN will contact family to obtain telephone consent.

## 2019-09-02 NOTE — Progress Notes (Signed)
RN  Has paged Cortrak team x2 about placing new Cortrak for patient for nutritional feedings as well as medication administration (previous Cortrak was removed for TEE procedure). RN has not received a response as of now. MD Doristine Bosworth has been notified and is aware of situation. Nurse will continue to monitor. Arby Barrette, RN

## 2019-09-02 NOTE — Transfer of Care (Signed)
Immediate Anesthesia Transfer of Care Note  Patient: Ryan Lane  Procedure(s) Performed: TRANSESOPHAGEAL ECHOCARDIOGRAM (TEE) (N/A ) BUBBLE STUDY  Patient Location: Endoscopy Unit  Anesthesia Type:MAC  Level of Consciousness: drowsy and responds to stimulation  Airway & Oxygen Therapy: Patient Spontanous Breathing and Patient connected to nasal cannula oxygen  Post-op Assessment: Report given to RN and Post -op Vital signs reviewed and stable  Post vital signs: Reviewed and stable  Last Vitals:  Vitals Value Taken Time  BP 134/58 09/02/19 1051  Temp    Pulse 95 09/02/19 1052  Resp 15 09/02/19 1052  SpO2 98 % 09/02/19 1052  Vitals shown include unvalidated device data.  Last Pain:  Vitals:   09/02/19 0953  TempSrc: Temporal  PainSc:          Complications: No apparent anesthesia complications

## 2019-09-02 NOTE — Progress Notes (Signed)
Echocardiogram Echocardiogram Transesophageal has been performed.  Ryan Lane 09/02/2019, 11:03 AM

## 2019-09-02 NOTE — Progress Notes (Signed)
Attempted MRI again with patient receiving dilaudid for pain. Pt still unable to lie back enough to be able to perform imaging. States hurts too much to be able to lay back. Attempted to build him up to get his head back far enough to be able to complete imaging. That was unsuccessful as well. RN notified. Given current condition, he may need anesthesia to be able to tolerate exam.

## 2019-09-02 NOTE — CV Procedure (Signed)
TRANSESOPHAGEAL ECHOCARDIOGRAM (TEE) NOTE  INDICATIONS: infective endocarditis  PROCEDURE:   Informed consent was obtained prior to the procedure. The risks, benefits and alternatives for the procedure were discussed and the patient comprehended these risks.  Risks include, but are not limited to, cough, sore throat, vomiting, nausea, somnolence, esophageal and stomach trauma or perforation, bleeding, low blood pressure, aspiration, pneumonia, infection, trauma to the teeth and death.    After a procedural time-out, the patient was given propofol sedation per anesthesia.  The patient's heart rate, blood pressure, and oxygen saturation are monitored continuously during the procedure. The transesophageal probe was inserted in the esophagus and stomach without difficulty and multiple views were obtained.  The patient was kept under observation until the patient left the procedure room. The patient left the procedure room in stable condition.   Agitated microbubble saline contrast was administered.  COMPLICATIONS:    There were no immediate complications.  Findings:  1. LEFT VENTRICLE: The left ventricular wall thickness is moderately increased.  The left ventricular cavity is normal in size. Wall motion is hyperdynamic.  LVEF is 65-70%.  2. RIGHT VENTRICLE:  The right ventricle is normal in structure and function without any thrombus or masses.    3. LEFT ATRIUM:  The left atrium is mildly dilated in size without any thrombus or masses.  There is not spontaneous echo contrast ("smoke") in the left atrium consistent with a low flow state.  4. LEFT ATRIAL APPENDAGE:  The small left atrial appendage is free of any thrombus or masses. The appendage has single lobes. Pulse doppler indicates moderate flow in the appendage.  5. ATRIAL SEPTUM:  The atrial septum appears intact and is free of thrombus and/or masses.  There is no evidence for interatrial shunting by color doppler and saline  microbubble.  6. RIGHT ATRIUM:  The right atrium is normal in size and function without any thrombus or masses.  7. MITRAL VALVE:  The mitral valve is normal in structure and function with trivial regurgitation.  There were no vegetations or stenosis.  8. AORTIC VALVE:  The aortic valve is trileaflet, normal in structure and function with no regurgitation. Prominent filamentous structures seen symmetrically on the outflow sides of the aortic leaflets, suspect Lambl's excrescences (normal variant). There were no vegetations or stenosis.  9. TRICUSPID VALVE:  The tricuspid valve is normal in structure and function with mild regurgitation.  There were no vegetations or stenosis  10.  PULMONIC VALVE:  The pulmonic valve is normal in structure and function with no regurgitation.  There were no vegetations or stenosis.   11. AORTIC ARCH, ASCENDING AND DESCENDING AORTA:  There was grade 1 Ron Parker et. Al, 1992) atherosclerosis of the ascending aorta, aortic arch, or proximal descending aorta.  12. PULMONARY VEINS: Anomalous pulmonary venous return was not noted.  13. PERICARDIUM: The pericardium appeared normal and non-thickened.  There is no pericardial effusion. A left pleural effusion is noted.  IMPRESSION:   1. No obvious endocarditis. 2. Negative for PFO by color doppler and saline microbubble contrast. 3. No LAA Thrombus 4. Moderate LVH 5. LVEF 65-70%  RECOMMENDATIONS:    1.  Management of bacteremia per ID recommendations.  Time Spent Directly with the Patient:  60 minutes   Pixie Casino, MD, [facility], [place] Director of the Advanced Lipid Disorders &  Cardiovascular Risk Reduction Clinic Diplomate of the American Board of Clinical Lipidology Attending Cardiologist  Direct Dial: [phone]  Fax: [phone]  Website:  [email or web address] Nicely Anahla Bevis 09/02/2019, 10:49 AM

## 2019-09-02 NOTE — Progress Notes (Signed)
Occupational Therapy Treatment Patient Details Name: Ryan ApleyRichard Lane MRN: [id number] DOB: 1951/02/01 Today's Date: 09/02/2019    History of present illness 68 y.o. M with significant PMH of alcohol and polysubstance abuse who presents to ER with fever, chills, body aches and diarrhea. Admitted with pneumonia/bacteremia with MRSA, [facility]2019 for further treatment. CT chest/abd/pelvis showing left psoas intramuscular hematoma with evidence of small active bleed. 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 TEE results indicate no obvious endocarditis, negative for PFO, no LAA thrombus, moderate LVH, LVEF 65-70%.    OT comments  Pt received asleep in bed, awoke pleasantly to therapist knocking. Pt agreeable to OT session. Pt required maxA+2 to progress to EOB requiring minA for stability and tolerated sitting EOB for about 5min before initiating return to sidelying stating "it's too much for one day". Pt with decreased functional use of BUE during ADL, noted edema in BUE, repositioned with BUE elevated by pillows. Pt requires increased processing time, follows simple commands inconsistently. Pt will continue to benefit from skilled OT services to maximize safety and independence with ADL/IADL and functional mobility. Will continue to follow acutely and progress as tolerated.    Follow Up Recommendations  SNF    Equipment Recommendations  Other (comment)(defer to next venue of care)    Recommendations for Other Services      Precautions / Restrictions Precautions Precautions: Fall Restrictions Weight Bearing Restrictions: No       Mobility Bed Mobility Overal bed mobility: Needs Assistance Bed Mobility: Rolling;Supine to Sit;Sit to Supine Rolling: Mod assist;+2 for physical assistance   Supine to sit: Max assist;+2 for physical assistance;+2 for safety/equipment Sit to supine: Max assist;+2 for physical assistance;+2 for safety/equipment;HOB  elevated   General bed mobility comments: max cues to progress BLE toward EOB, required assistance for RLE managment, able to intiate progression of LLE toward EOB with increased processing time (~30seconds)  Transfers                 General transfer comment: deferred    Balance Overall balance assessment: Needs assistance Sitting-balance support: Feet supported Sitting balance-Leahy Scale: Poor Sitting balance - Comments: increased cervical and trunk flexion with left lateral lean, reports pain with attempts to progress to upright posture;requires minA-modA for stability sitting EOB;tolerated sitting EOB <275min Postural control: Left lateral lean                                 ADL either performed or assessed with clinical judgement   ADL Overall ADL's : Needs assistance/impaired Eating/Feeding: NPO   Grooming: Maximal assistance           Upper Body Dressing : Maximal assistance   Lower Body Dressing: Total assistance                 General ADL Comments: able to tolerate sitting EOB for <375min before initiating return to supine stating "it's too much for one day"     Vision   Vision Assessment?: Vision impaired- to be further tested in functional context Additional Comments: pt reports he wears glasses all the time but does not have them here, no c/o blurry or double vision, pt reports no changes in vision;pt keeps eyes closed majority of session, appears to have limitation with right visual field, will continue to assess   Perception     Praxis      Cognition Arousal/Alertness: Lethargic Behavior During  Therapy: Flat affect Overall Cognitive Status: Impaired/Different from baseline Area of Impairment: Orientation;Attention;Memory;Following commands;Safety/judgement;Awareness;Problem solving                 Orientation Level: Time;Situation;Place Current Attention Level: Alternating Memory: Decreased short-term memory Following  Commands: Follows one step commands inconsistently;Follows one step commands with increased time Safety/Judgement: Decreased awareness of safety;Decreased awareness of deficits Awareness: Intellectual;Emergent Problem Solving: Slow processing;Decreased initiation;Requires verbal cues;Requires tactile cues;Difficulty sequencing General Comments: garbled speech throughout session, able to understand simple words when listening very closely;pt asked if therapist and therapy tech "work together for the same company in this building?";pt follows simple commands 75% of the time with increased time;difficult to assess cognition secondary to communication limitations        Exercises     Shoulder Instructions       General Comments pt with audible gurgle in lungs and nonproductive cough. educated pt on need to stretch neck, RN aware too    Pertinent Vitals/ Pain       Pain Assessment: Faces Faces Pain Scale: Hurts even more Pain Location: grimacing with cervical stretching/movement Pain Descriptors / Indicators: Grimacing Pain Intervention(s): Limited activity within patient's tolerance;Monitored during session;Repositioned  Home Living                                          Prior Functioning/Environment              Frequency  Min 2X/week        Progress Toward Goals  OT Goals(current goals can now be found in the care plan section)  Progress towards OT goals: Progressing toward goals  Acute Rehab OT Goals Patient Stated Goal: to rest OT Goal Formulation: With patient Time For Goal Achievement: 09/12/19 Potential to Achieve Goals: Fair ADL Goals Pt Will Perform Grooming: with supervision;sitting;standing Pt Will Perform Upper Body Bathing: with supervision;sitting Pt Will Perform Lower Body Bathing: with supervision;sitting/lateral leans;sit to/from stand Pt Will Perform Upper Body Dressing: with supervision;sitting Pt Will Perform Lower Body  Dressing: with supervision;sitting/lateral leans;sit to/from stand Pt Will Transfer to Toilet: with min guard assist;ambulating Pt Will Perform Toileting - Clothing Manipulation and hygiene: with supervision;sitting/lateral leans;sit to/from stand  Plan Discharge plan remains appropriate    Co-evaluation                 AM-PAC OT "6 Clicks" Daily Activity     Outcome Measure   Help from another person eating meals?: Total Help from another person taking care of personal grooming?: A Lot Help from another person toileting, which includes using toliet, bedpan, or urinal?: Total Help from another person bathing (including washing, rinsing, drying)?: Total Help from another person to put on and taking off regular upper body clothing?: A Lot Help from another person to put on and taking off regular lower body clothing?: Total 6 Click Score: 8    End of Session Equipment Utilized During Treatment: Oxygen  OT Visit Diagnosis: Unsteadiness on feet (R26.81);Muscle weakness (generalized) (M62.81);Other symptoms and signs involving the nervous system (R29.898);Other symptoms and signs involving cognitive function;Pain Pain - part of body: (neck)   Activity Tolerance Patient limited by fatigue   Patient Left in bed;with call bell/phone within reach;with bed alarm set;with restraints reapplied   Nurse Communication Mobility status(importance of neck stretches and elevate BUE)        Time: 1423-1450 OT Time Calculation (min):  27 min  Charges: OT General Charges $OT Visit: 1 Visit OT Treatments $Self Care/Home Management : 23-37 mins  Diona Browner OTR/L Acute Rehabilitation Services Office: [phone]    Ryan Lane 09/02/2019, 3:25 PM

## 2019-09-02 NOTE — Progress Notes (Signed)
SLP Cancellation Note  Patient Details Name: Akshat Minehart MRN: [id number] DOB: 09/20/1951   Cancelled treatment:       Reason Eval/Treat Not Completed: Medical issues which prohibited therapy - pt remains NPO pending possible TEE. Will f/u as able.   Venita Sheffield Mackinsey Pelland 09/02/2019, 7:50 AM  Pollyann Glen, M.A. [place] Acute Environmental education officer [phone] Office [phone]

## 2019-09-02 NOTE — H&P (Signed)
   INTERVAL PROCEDURE H&P  History and Physical Interval Note:  09/02/2019 10:11 AM  Gillie Manners has presented today for their planned procedure. The various methods of treatment have been discussed with the patient and family. After consideration of risks, benefits and other options for treatment, the patient has consented to the procedure.  The patients' outpatient history has been reviewed, patient examined, and no change in status from most recent office note within the past 30 days. I have reviewed the patients' chart and labs and will proceed as planned. Questions were answered to the patient's satisfaction.   Pixie Casino, MD, [facility], [place] Director of the Advanced Lipid Disorders &  Cardiovascular Risk Reduction Clinic Diplomate of the American Board of Clinical Lipidology Attending Cardiologist  Direct Dial: [phone]  Fax: [phone]  Website:  [email or web address] 09/02/2019, 10:11 AM

## 2019-09-02 NOTE — Progress Notes (Signed)
PROGRESS NOTE    Ryan Lane  ZOX:[id number] DOB: 03-Feb-1951 DOA: 08/28/2019 PCP: Patient, No Pcp Per   Brief Narrative:  68 y.o.malewith a history of alcohol abuse who was brought to [facility] on November 7 and found to have pneumonia/bacteremia with MRSA as well as stroke involving the right cerebellar and right corona radiata and acute oblique encephalopathy transferred to [facility] on 08/28/2019 for further treatment and possible TEE EEG and TTE at [facility] were largely unremarkable.  Seen by neurology here.  They think his stroke was likely embolic.  MRI with and without contrast is being repeated by them.  Palliative care was consulted to discuss goals of care.  Per their discussion with the family, family wants to proceed with TEE. We are also trying to get MRI cervical and thoracic spine to rule out cervical abscess.  Patient has been having persistent bacteremia with recent blood culture positive from 08/28/2019.  Repeat blood culture from 09/01/2019 are in process.  Assessment & Plan:   Principal Problem:   MRSA bacteremia Active Problems:   Alcohol withdrawal (HCC)   Acute CVA (cerebrovascular accident) (HCC)   Hyponatremia   Dyspnea and respiratory abnormalities   Palliative care encounter   1) Left Psoas intramuscular hematoma with evidence of small active bleed: His subcutaneous Lovenox and aspirin were discontinued.  His hemoglobin is trending down but very slowly.  Repeat CBC in the morning.  If not big of a drop, will prefer to resume his aspirin due to his recent acute ischemic stroke. - 2) acute toxic/metabolic encephalopathy--- in the setting of MRSA bacteremia and pneumonia as well as a stroke.  Continue IV vancomycin.  Repeat EEG here did not show epileptiform findings.  He may be having Wernicke's encephalopathy component as well.  Seen by neurology, they ordered repeat MRI however patient would not lay flat for the MRI to be completed.   3)Cerebellar CVA--MRI from [facility] with acute stroke (punctate cerebellar infarct/ right cerebellar and right corona radiata )----- may be embolic, however unable to give aspirin due to #1 above.  Neurology has ordered repeat MRI of the brain but patient was unable to lay flat so this study could not be performed.  4)Sepsis secondary to MRSA Bacteremia and pneumonia--- -4/4 aerobic bottles positive for GPCs identified as Staph aureus and is MEC A positive  WBC remains elevated but he remains afebrile.continue IV vancomycin, repeat blood cultures from 08/28/2019 + again.  We will repeat another blood culture sent today.  Palliative care discussed in length with the family.  Finally TEE done today and fortunately, it ruled out endocarditis as well as intramural thrombus.  Source of his MRSA bacteremia could very well be cervical vertebral abscess.  For that reason, MRI cervical and thoracic spine was ordered however patient could not lay flat for the study to be performed.  Antibiotic duration defer to ID.  5)Polysubstance Abuse--- as per patient's nephew [name]--patient has history of methamphetamine, THC alcohol and other illicit drug use.  Social worker on board.  6)Social/Ethics--patient is estranged from his 2 sons over the years due to polysubstance abuse, patient sister [name] and patient's nephew [name] [phone] his primary contact  7) dysphagia/diet--cortrak was removed by Endo team today in order to get TEE.  RN calling cortrak team to place 1.-tube feeding per dietitian/nutritionist  8.  Hypokalemia: Resolved.  DVT prophylaxis: SCD Code Status: DNR Family Communication:  None present at bedside.  Palliative care on board and having  regular discussions with the family and per their discussion with family on 08/31/2019, patient was made DNR. Disposition Plan: To be determined  There is no height or weight on file to calculate BMI.      Nutritional  status:  Nutrition Problem: Inadequate oral intake Etiology: inability to eat   Signs/Symptoms: NPO status   Interventions: Refer to RD note for recommendations    Consultants:   Neurology and ID  Procedures:   None  Antimicrobials:   IV vancomycin   Subjective: Patient seen and examined after return to his room after having TEE.  Patient was sleepy, likely due to anesthesia effect.  He was easily arousable.  He denied any complaint.  Objective: Vitals:   09/02/19 0342 09/02/19 0405 09/02/19 0802 09/02/19 0953  BP:  (!) 164/89 (!) 144/70 (!) 149/67  Pulse: 99 (!) 106 100 (!) 102  Resp: (!) 21 (!) Temp:  98.1 F (36.7 C) 98.5 F (36.9 C) 99.1 F (37.3 C)  TempSrc:  Axillary Oral Temporal  SpO2: 98%  100% 98%  Weight:    63.8 kg    Intake/Output Summary (Last 24 hours) at 09/02/2019 1009 Last data filed at 09/02/2019 0547 Gross per 24 hour  Intake 3409.48 ml  Output 1650 ml  Net 1759.48 ml   Filed Weights   08/30/19 0424 08/31/19 0341 09/02/19 0953  Weight: 63.9 kg 63.8 kg 63.8 kg    Examination:  General exam: Appears lethargic and sick Respiratory system: Rhonchi bilaterally with gurgling sound in the posterior pharynx.Marland Kitchen Respiratory effort normal. Cardiovascular system: S1 & S2 heard, RRR. No JVD, murmurs, rubs, gallops or clicks. No pedal edema. Gastrointestinal system: Abdomen is nondistended, soft and nontender. No organomegaly or masses felt. Normal bowel sounds heard. Central nervous system: Lethargic but oriented.  Right upper extremity weakness.  Left facial droop.  Dysarthria. Extremities: Symmetric 5 x 5 power. Skin: No rashes, lesions or ulcers.  Psychiatry: Judgement and insight appear poor. Mood & affect appropriate.   Data Reviewed: I have personally reviewed following labs and imaging studies  CBC: Recent Labs  Lab 08/28/19 0245  08/29/19 0724 08/30/19 1913 08/31/19 0336 09/01/19 0908 09/02/19 0334  WBC 29.1*   < >  31.9* 24.8* 25.6* 21.1* 16.9*  NEUTROABS 25.9*  --   --   --   --  18.1* 13.9*  HGB 14.4   < > 11.6* 11.8* 11.1* 10.7* 9.4*  HCT 42.0   < > 34.4* 34.3* 32.2* 32.0* 27.6*  MCV 93.1   < > 96.4 96.9 96.7 97.3 95.2  PLT 191   < > 195 204 218 281 231   < > = values in this interval not displayed.   Basic Metabolic Panel: Recent Labs  Lab 08/28/19 0245  08/28/19 1403 08/29/19 0724 08/31/19 0336 09/01/19 0908 09/02/19 0334  NA 148*   < > 148* 147* 147* 141 139  K 3.4*   < > 3.6 3.5 3.0* 3.2* 3.7  CL 112*   < > 111 110 110 104 105  CO2 27   < > GLUCOSE 136*   < > 121* 160* 110* 121* 106*  BUN 9   < > CREATININE 0.56*   < > 0.83 0.81 0.58* 0.50* 0.50*  CALCIUM 8.6*   < > 8.6* 8.0* 8.1* 8.0* 7.7*  MG 1.7  --   --   --   --  1.9  --    < > =  values in this interval not displayed.   GFR: CrCl cannot be calculated (Unknown ideal weight.). Liver Function Tests: Recent Labs  Lab 08/28/19 0245 08/29/19 0724 09/01/19 0908 09/02/19 0334  AST 32 28 50* 100*  ALT 23 16 38 61*  ALKPHOS 79 77 75 81  BILITOT 1.2 0.7 0.8 0.7  PROT 5.6* 5.2* 5.7* 5.4*  ALBUMIN 2.0* 1.7* 1.5* 1.4*   No results for input(s): LIPASE, AMYLASE in the last 168 hours. Recent Labs  Lab 08/28/19 0509  AMMONIA 45*   Coagulation Profile: No results for input(s): INR, PROTIME in the last 168 hours. Cardiac Enzymes: Recent Labs  Lab 08/28/19 0510  CKTOTAL 36*   BNP (last 3 results) No results for input(s): PROBNP in the last 8760 hours. HbA1C: No results for input(s): HGBA1C in the last 72 hours. CBG: Recent Labs  Lab 09/01/19 1503 09/01/19 2013 09/01/19 2337 09/02/19 0409 09/02/19 0819  GLUCAP 117* 119* 157* 100* 110*   Lipid Profile: No results for input(s): CHOL, HDL, LDLCALC, TRIG, CHOLHDL, LDLDIRECT in the last 72 hours. Thyroid Function Tests: No results for input(s): TSH, T4TOTAL, FREET4, T3FREE, THYROIDAB in the last 72 hours. Anemia Panel: No results for  input(s): VITAMINB12, FOLATE, FERRITIN, TIBC, IRON, RETICCTPCT in the last 72 hours. Sepsis Labs: Recent Labs  Lab 08/28/19 0525 08/28/19 0627  LATICACIDVEN 2.1* 2.2*    Recent Results (from the past 240 hour(s))  Culture, Urine     Status: None   Collection Time: 08/28/19  1:07 AM   Specimen: Urine, Catheterized  Result Value Ref Range Status   Specimen Description URINE, CATHETERIZED  Final   Special Requests Normal  Final   Culture   Final    NO GROWTH Performed at [facility], [address] [place], [place] [address]    Report Status 08/31/2019 FINAL  Final  Culture, blood (routine x 2)     Status: Abnormal   Collection Time: 08/28/19  4:52 PM   Specimen: BLOOD LEFT HAND  Result Value Ref Range Status   Specimen Description BLOOD LEFT HAND  Final   Special Requests AEROBIC BOTTLE ONLY Blood Culture adequate volume  Final   Culture  Setup Time   Final    AEROBIC BOTTLE ONLY GRAM POSITIVE COCCI CRITICAL RESULT CALLED TO, READ BACK BY AND VERIFIED WITH: C PIERCE PHARMD 08/30/19 0514 JDW    Culture (A)  Final    STAPHYLOCOCCUS AUREUS SUSCEPTIBILITIES PERFORMED ON PREVIOUS CULTURE WITHIN THE LAST 5 DAYS. Performed at [facility], [place] [address], [place], [place] [address]    Report Status 08/31/2019 FINAL  Final  Culture, blood (routine x 2)     Status: Abnormal   Collection Time: 08/28/19  4:52 PM   Specimen: BLOOD LEFT HAND  Result Value Ref Range Status   Specimen Description BLOOD LEFT HAND  Final   Special Requests AEROBIC BOTTLE ONLY Blood Culture adequate volume  Final   Culture  Setup Time   Final    GRAM POSITIVE COCCI AEROBIC BOTTLE ONLY CRITICAL VALUE NOTED.  VALUE IS CONSISTENT WITH PREVIOUSLY REPORTED AND CALLED VALUE.    Culture (A)  Final    STAPHYLOCOCCUS AUREUS SUSCEPTIBILITIES PERFORMED ON PREVIOUS CULTURE WITHIN THE LAST 5 DAYS. Performed at [facility], [place] [address], [place], [place] [address]    Report Status 08/31/2019  FINAL  Final  Blood Culture ID Panel (Reflexed)     Status: Abnormal   Collection Time: 08/28/19  4:52 PM  Result Value Ref Range Status   Enterococcus species NOT DETECTED NOT DETECTED Final   Listeria monocytogenes NOT DETECTED NOT DETECTED Final   Staphylococcus species DETECTED (A) NOT DETECTED Final    Comment: CRITICAL RESULT CALLED TO, READ BACK BY AND VERIFIED WITH: C PIERCE PHARMD 08/30/19 0514 JDW    Staphylococcus aureus (BCID) DETECTED (A) NOT DETECTED Final    Comment: Methicillin (oxacillin)-resistant Staphylococcus aureus (MRSA). MRSA is predictably resistant to beta-lactam antibiotics (except ceftaroline). Preferred therapy is vancomycin unless clinically contraindicated. Patient requires contact precautions if  hospitalized. CRITICAL RESULT CALLED TO, READ BACK BY AND VERIFIED WITH: C PIERCE PHARMD 08/30/19 0514 JDW    Methicillin resistance DETECTED (A) NOT DETECTED Final    Comment: CRITICAL RESULT CALLED TO, READ BACK BY AND VERIFIED WITH: C PIERCE PHARMD 08/30/19 0514 JDW    Streptococcus species NOT DETECTED NOT DETECTED Final   Streptococcus agalactiae NOT DETECTED NOT DETECTED Final   Streptococcus pneumoniae NOT DETECTED NOT DETECTED Final   Streptococcus pyogenes NOT DETECTED NOT DETECTED Final   Acinetobacter baumannii NOT DETECTED NOT DETECTED Final   Enterobacteriaceae species NOT DETECTED NOT DETECTED Final   Enterobacter cloacae complex NOT DETECTED NOT DETECTED Final   Escherichia coli NOT DETECTED NOT DETECTED Final   Klebsiella oxytoca NOT DETECTED NOT DETECTED Final   Klebsiella pneumoniae NOT DETECTED NOT DETECTED Final   Proteus species NOT DETECTED NOT DETECTED Final   Serratia marcescens NOT DETECTED NOT DETECTED Final   Haemophilus influenzae NOT DETECTED NOT DETECTED Final   Neisseria meningitidis NOT DETECTED NOT DETECTED Final   Pseudomonas aeruginosa NOT DETECTED NOT DETECTED Final   Candida albicans NOT DETECTED NOT DETECTED Final    Candida glabrata NOT DETECTED NOT DETECTED Final   Candida krusei NOT DETECTED NOT DETECTED Final   Candida parapsilosis NOT DETECTED NOT DETECTED Final   Candida tropicalis NOT DETECTED NOT DETECTED Final    Comment: Performed at [facility], [address], [place], [place] [address]  Culture, blood (routine x 2)     Status: Abnormal   Collection Time: 08/28/19  8:34 PM   Specimen: BLOOD LEFT HAND  Result Value Ref Range Status   Specimen Description BLOOD LEFT HAND  Final   Special Requests   Final    BOTTLES DRAWN AEROBIC ONLY Blood Culture results may not be optimal due to an inadequate volume of blood received in culture bottles   Culture  Setup Time   Final    AEROBIC BOTTLE ONLY GRAM POSITIVE COCCI CRITICAL VALUE NOTED.  VALUE IS CONSISTENT WITH PREVIOUSLY REPORTED AND CALLED VALUE.    Culture (A)  Final    STAPHYLOCOCCUS AUREUS SUSCEPTIBILITIES PERFORMED ON PREVIOUS CULTURE WITHIN THE LAST 5 DAYS. Performed at [facility], [address] [place], [place] [address]    Report Status 08/31/2019 FINAL  Final  Culture, blood (routine x 2)     Status: Abnormal   Collection Time: 08/28/19  8:34 PM   Specimen: BLOOD RIGHT HAND  Result Value Ref Range Status   Specimen Description BLOOD RIGHT HAND  Final   Special Requests   Final    BOTTLES DRAWN AEROBIC ONLY Blood Culture results may not be optimal due to an inadequate volume of blood received in culture bottles   Culture  Setup Time   Final    GRAM POSITIVE COCCI IN CLUSTERS AEROBIC BOTTLE ONLY CRITICAL RESULT CALLED TO, READ BACK BY AND VERIFIED WITH: PHARMD CHRIS WALSTON [id number] FCP Performed at  [facility], [address] [place], [place] [address]    Culture METHICILLIN RESISTANT STAPHYLOCOCCUS AUREUS (A)  Final   Report Status 08/31/2019 FINAL  Final   Organism ID, Bacteria METHICILLIN RESISTANT STAPHYLOCOCCUS AUREUS  Final      Susceptibility   Methicillin resistant staphylococcus aureus -  MIC*    CIPROFLOXACIN >=8 RESISTANT Resistant     ERYTHROMYCIN >=8 RESISTANT Resistant     GENTAMICIN <=0.5 SENSITIVE Sensitive     OXACILLIN >=4 RESISTANT Resistant     TETRACYCLINE <=1 SENSITIVE Sensitive     VANCOMYCIN 1 SENSITIVE Sensitive     TRIMETH/SULFA <=10 SENSITIVE Sensitive     CLINDAMYCIN <=0.25 SENSITIVE Sensitive     RIFAMPIN <=0.5 SENSITIVE Sensitive     Inducible Clindamycin NEGATIVE Sensitive     * METHICILLIN RESISTANT STAPHYLOCOCCUS AUREUS      Radiology Studies: No results found.  Scheduled Meds: . feeding supplement (OSMOLITE 1.5 CAL)  1,000 mL Per Tube Q24H  . feeding supplement (PRO-STAT SUGAR FREE 64)  30 mL Per Tube BID  . folic acid  1 mg Oral Daily  . Gerhardt's butt cream   Topical Daily  . LORazepam  1 mg Intravenous Once  . multivitamin with minerals  1 tablet Per Tube Daily  . thiamine  100 mg Oral Daily   Or  . thiamine  100 mg Intravenous Daily   Continuous Infusions: . sodium chloride Stopped (09/01/19 0422)  . dextrose 5 % and 0.45 % NaCl with KCl 10 mEq/L 125 mL/hr at 09/02/19 0410  . folic acid (FOLVITE) IVPB Stopped (09/01/19 2121)  . lactated ringers 1,000 mL (09/02/19 0957)  . vancomycin 1,000 mg (09/02/19 0417)     LOS: 5 days   Time spent: 28 minutes   Hughie Clossavi Kamaya Keckler, MD [facility]  09/02/2019, 10:09 AM   To contact the attending provider between 7A-7P or the covering provider during after hours 7P-7A, please log into the web site [email or web address] and use password [email or web address].

## 2019-09-02 NOTE — Progress Notes (Signed)
Palliative:   [name] is lying quietly in bed.  He appears acutely/chronically ill and frail.  He is quite sleepy, but has had a TEE earlier today.  He is very difficult to understand, I am unable to determine orientation.  There is no family at bedside at this time.  Note to call son, Ryan Lane at [phone] in [place].  We talk in detail about current tests and treatment plan.  We discuss selected labs, including, but not limited to, WBC and albumin.  We talk about the chronic illness pathway, what is normal and expected.  We talk about giving time for stroke recovery.  We also talk about nutrition and ST plan.   Ryan Lane shares that if things "shift in the wrong direction" we will have "continued discussions".  Ryan Lane shares that his father would not want to live in a nursing home, returning to his own home is important to him.   Plan:  Continue to treat the treatable, but no extraordinary measures. Agreeable to rehab if able. Agreeable to out patient palliative care.   PMT to continue to follow, call son Ryan Lane at [phone]  on Friday.   65 minutes, extended time.   Quinn Axe, NP Palliative Medicine Team Team Phone # [phone] Greater than 50% of this time was spent counseling and coordinating care related to the above assessment and plan.

## 2019-09-02 NOTE — Anesthesia Postprocedure Evaluation (Signed)
Anesthesia Post Note  Patient: Ryan Lane  Procedure(s) Performed: TRANSESOPHAGEAL ECHOCARDIOGRAM (TEE) (N/A ) BUBBLE STUDY     Patient location during evaluation: Endoscopy Anesthesia Type: MAC Level of consciousness: awake and alert Pain management: pain level controlled Vital Signs Assessment: post-procedure vital signs reviewed and stable Respiratory status: spontaneous breathing, nonlabored ventilation and respiratory function stable Cardiovascular status: blood pressure returned to baseline and stable Postop Assessment: no apparent nausea or vomiting Anesthetic complications: no    Last Vitals:  Vitals:   09/02/19 1101 09/02/19 1128  BP: (!) 144/70 138/73  Pulse: 94 94  Resp: 13 17  Temp: 37.2 C 37 C  SpO2: 98% 100%    Last Pain:  Vitals:   09/02/19 1128  TempSrc: Axillary  PainSc:                  Lidia Collum

## 2019-09-03 ENCOUNTER — Encounter (HOSPITAL_COMMUNITY): Payer: Self-pay | Admitting: Internal Medicine

## 2019-09-03 ENCOUNTER — Inpatient Hospital Stay (HOSPITAL_COMMUNITY): Payer: Medicare Other

## 2019-09-03 LAB — COMPREHENSIVE METABOLIC PANEL
ALT: 49 U/L — ABNORMAL HIGH (ref 0–44)
AST: 61 U/L — ABNORMAL HIGH (ref 15–41)
Albumin: 1.3 g/dL — ABNORMAL LOW (ref 3.5–5.0)
Alkaline Phosphatase: 64 U/L (ref 38–126)
Anion gap: 6 (ref 5–15)
BUN: 8 mg/dL (ref 8–23)
CO2: 27 mmol/L (ref 22–32)
Calcium: 7.7 mg/dL — ABNORMAL LOW (ref 8.9–10.3)
Chloride: 102 mmol/L (ref 98–111)
Creatinine, Ser: 0.47 mg/dL — ABNORMAL LOW (ref 0.61–1.24)
GFR calc Af Amer: >60 mL/min (ref >60)
GFR calc non Af Amer: >60 mL/min (ref >60)
Glucose, Bld: 110 mg/dL — ABNORMAL HIGH (ref 70–99)
Potassium: 3.5 mmol/L (ref 3.5–5.1)
Sodium: 135 mmol/L (ref 135–145)
Total Bilirubin: 0.5 mg/dL (ref 0.3–1.2)
Total Protein: 5.3 g/dL — ABNORMAL LOW (ref 6.5–8.1)

## 2019-09-03 LAB — VITAMIN B1: Vitamin B1 (Thiamine): 303.9 nmol/L — ABNORMAL HIGH (ref 66.5–200.0)

## 2019-09-03 LAB — CBC WITH DIFFERENTIAL/PLATELET
Abs Immature Granulocytes: 0.13 10*3/uL — ABNORMAL HIGH (ref 0.00–0.07)
Basophils Absolute: 0 10*3/uL (ref 0.0–0.1)
Basophils Relative: 0 %
Eosinophils Absolute: 0.2 10*3/uL (ref 0.0–0.5)
Eosinophils Relative: 1 %
HCT: 26 % — ABNORMAL LOW (ref 39.0–52.0)
Hemoglobin: 8.8 g/dL — ABNORMAL LOW (ref 13.0–17.0)
Immature Granulocytes: 1 %
Lymphocytes Relative: 10 %
Lymphs Abs: 1.6 10*3/uL (ref 0.7–4.0)
MCH: 32 pg (ref 26.0–34.0)
MCHC: 33.8 g/dL (ref 30.0–36.0)
MCV: 94.5 fL (ref 80.0–100.0)
Monocytes Absolute: 1 10*3/uL (ref 0.1–1.0)
Monocytes Relative: 6 %
Neutro Abs: 13.1 10*3/uL — ABNORMAL HIGH (ref 1.7–7.7)
Neutrophils Relative %: 82 %
Platelets: 363 10*3/uL (ref 150–400)
RBC: 2.75 MIL/uL — ABNORMAL LOW (ref 4.22–5.81)
RDW: 13.1 % (ref 11.5–15.5)
WBC: 15.9 10*3/uL — ABNORMAL HIGH (ref 4.0–10.5)
nRBC: 0 % (ref 0.0–0.2)

## 2019-09-03 LAB — GLUCOSE, CAPILLARY
Glucose-Capillary: 112 mg/dL — ABNORMAL HIGH (ref 70–99)
Glucose-Capillary: 114 mg/dL — ABNORMAL HIGH (ref 70–99)
Glucose-Capillary: 119 mg/dL — ABNORMAL HIGH (ref 70–99)
Glucose-Capillary: 121 mg/dL — ABNORMAL HIGH (ref 70–99)
Glucose-Capillary: 145 mg/dL — ABNORMAL HIGH (ref 70–99)
Glucose-Capillary: 94 mg/dL (ref 70–99)

## 2019-09-03 MED ORDER — ADULT MULTIVITAMIN W/MINERALS CH
1.0000 | ORAL_TABLET | Freq: Every day | ORAL | Status: DC
  Administered 2019-09-04 – 2019-09-06 (×3): 1 via ORAL
  Filled 2019-09-03 (×4): qty 1

## 2019-09-03 MED ORDER — RESOURCE THICKENUP CLEAR PO POWD
ORAL | Status: DC | PRN
  Filled 2019-09-03: qty 125

## 2019-09-03 MED ORDER — POTASSIUM CHLORIDE 2 MEQ/ML IV SOLN
INTRAVENOUS | Status: DC
  Administered 2019-09-03 – 2019-09-04 (×3): via INTRAVENOUS
  Filled 2019-09-03 (×6): qty 1000

## 2019-09-03 NOTE — Progress Notes (Addendum)
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  He was transferred from [facility] to [facility] on November 12 for further evaluation and management.  He underwent a TEE which was negative for acute endocarditis.  Patient had persistent bacteremia with repeat blood cultures from 08/28/2019.  He continues to have fever and Infectious disease is on board.  Assessment & Plan:   Principal Problem:   MRSA bacteremia Active Problems:   Alcohol withdrawal ([place])   Acute CVA (cerebrovascular accident) ([place])   Hyponatremia   Dyspnea and respiratory abnormalities   Palliative care encounter   Encounter for hospice care discussion   Goals of care, counseling/discussion   Palliative care by specialist  Sepsis secondary to MRSA bacteremia and pneumonia Persistent MRSA bacteremia on repeat blood cultures and persistent fevers. Patient is currently on IV vancomycin and further recommendations of antibiotic duration as per infectious disease.  Further evaluation of the source of persistent bacteremia with an MRI of the cervical and thoracic spine ordered but unfortunately could not be done as patient could not lay flat for the study to be performed.   Acute toxic/metabolic encephalopathy in the setting of MRSA bacteremia and pneumonia, stroke.  Neurology on board recommended an MRI of the brain but could not be done as patient could not lay flat and tolerated.  An EEG did not show any epileptiform findings.  Another differential could be Wernicke's encephalopathy.    Cerebellar CVA MRI from [facility] showed punctate right cerebellar and right corona radiata. Neurology is on board and  further recommendations to follow.   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   Left psoas intramuscular hematoma Patient's hemoglobin continues to drop from 11.6-11 0.1-9 0.4-8.8.  Aspirin and Lovenox injections are on hold due to intramuscular hematoma.    Hyponatremia Appears to be chronic secondary to history of polysubstance abuse.  Resolved    Dysphagia Diet as per nutritionist    Hypokalemia Replaced   History of alcohol abuse No signs of withdrawal at this time.  Mildly elevated liver enzymes Continue to monitor.  Patient denies any nausea or vomiting at this time.    In view of poor functional status, clinical deterioration, persistent MRSA bacteremia and fevers palliative care consulted and is on board with discussions with the family.  He is currently DNR and further recommendations to follow.   DVT prophylaxis: SCDs Code Status: DNR Family Communication: None at bedside, will call the family and update Disposition Plan: Pending clinical improvement and further evaluation of persistent bacteremia and fevers.   Consultants:   Infectious disease  Neurology  Procedures: CT of the abdomen pelvis on admission Antimicrobials:  IV vancomycin  Subjective:  Patient continues to be sleepy, no chest pain or shortness of breath he denies any complaints. Objective: Vitals:   09/03/19 0401 09/03/19 0747 09/03/19 0937 09/03/19 1134  BP: 134/71 140/77 135/73 135/79  Pulse: 98 98 (!) 104 (!) 110  Resp: 18 (!) 21 (!) 21 (!) 22  Temp: 98.1 F (36.7 C) 98.7 F (37.1 C) 98.6 F (37 C) (!) 100.7 F (38.2 C)  TempSrc: Oral Oral  Oral  SpO2: 99% 100% 100% 98%  Weight: 67.6 kg  Intake/Output Summary (Last 24 hours) at 09/03/2019 1215 Last data filed at 09/03/2019 0402 Gross per 24 hour  Intake -  Output 500 ml  Net -500 ml   Filed Weights   08/31/19 0341 09/02/19 0953 09/03/19 0401  Weight: 63.8 kg 63.8 kg 67.6 kg     Examination:  General exam: Not in profound distress, well developed, ill-appearing gentleman. Respiratory system: Scattered rhonchi present, tachypnea respiratory effort normal. Cardiovascular system: S1 & S2 heard, tachycardia present no pedal edema. Gastrointestinal system: Abdomen is nondistended, soft and nontender. No organomegaly or masses felt. Normal bowel sounds heard. Central nervous system: Sleepy but arousable, right upper extremity weakness with left facial droop. Extremities: Trace edema Skin: No rashes, lesions or ulcers Psychiatry: Could not be assessed    Data Reviewed: I have personally reviewed following labs and imaging studies  CBC: Recent Labs  Lab 08/28/19 0245  08/30/19 1913 08/31/19 0336 09/01/19 0908 09/02/19 0334 09/03/19 0340  WBC 29.1*   < > 24.8* 25.6* 21.1* 16.9* 15.9*  NEUTROABS 25.9*  --   --   --  18.1* 13.9* 13.1*  HGB 14.4   < > 11.8* 11.1* 10.7* 9.4* 8.8*  HCT 42.0   < > 34.3* 32.2* 32.0* 27.6* 26.0*  MCV 93.1   < > 96.9 96.7 97.3 95.2 94.5  PLT 191   < > 204 218 281 231 363   < > = values in this interval not displayed.   Basic Metabolic Panel: Recent Labs  Lab 08/28/19 0245  08/29/19 0724 08/31/19 0336 09/01/19 0908 09/02/19 0334 09/03/19 0340  NA 148*   < > 147* 147* 141 139 135  K 3.4*   < > 3.5 3.0* 3.2* 3.7 3.5  CL 112*   < > 110 110 104 105 102  CO2 27   < > 29 27 28 26 27   GLUCOSE 136*   < > 160* 110* 121* 106* 110*  BUN 9   < > 12 9 8 10 8   CREATININE 0.56*   < > 0.81 0.58* 0.50* 0.50* 0.47*  CALCIUM 8.6*   < > 8.0* 8.1* 8.0* 7.7* 7.7*  MG 1.7  --   --   --  1.9  --   --    < > = values in this interval not displayed.   GFR: CrCl cannot be calculated (Unknown ideal weight.). Liver Function Tests: Recent Labs  Lab 08/28/19 0245 08/29/19 0724 09/01/19 0908 09/02/19 0334 09/03/19 0340  AST 32 28 50* 100* 61*  ALT 23 16 38 61* 49*  ALKPHOS 79 77 75 81 64  BILITOT 1.2 0.7 0.8 0.7 0.5  PROT 5.6* 5.2* 5.7* 5.4*  5.3*  ALBUMIN 2.0* 1.7* 1.5* 1.4* 1.3*   No results for input(s): LIPASE, AMYLASE in the last 168 hours. Recent Labs  Lab 08/28/19 0509  AMMONIA 45*   Coagulation Profile: No results for input(s): INR, PROTIME in the last 168 hours. Cardiac Enzymes: Recent Labs  Lab 08/28/19 0510  CKTOTAL 36*   BNP (last 3 results) No results for input(s): PROBNP in the last 8760 hours. HbA1C: No results for input(s): HGBA1C in the last 72 hours. CBG: Recent Labs  Lab 09/02/19 1933 09/02/19 2337 09/03/19 0403 09/03/19 0752 09/03/19 1131  GLUCAP 102* 100* 112* 119* 94   Lipid Profile: No results for input(s): CHOL, HDL, LDLCALC, TRIG, CHOLHDL, LDLDIRECT in the last 72 hours. Thyroid Function Tests: No results for input(s): TSH, T4TOTAL, FREET4, T3FREE, THYROIDAB in the last 72 hours.  Anemia Panel: No results for input(s): VITAMINB12, FOLATE, FERRITIN, TIBC, IRON, RETICCTPCT in the last 72 hours. Sepsis Labs: Recent Labs  Lab 08/28/19 0525 08/28/19 0627  LATICACIDVEN 2.1* 2.2*    Recent Results (from the past 240 hour(s))  Culture, Urine     Status: None   Collection Time: 08/28/19  1:07 AM   Specimen: Urine, Catheterized  Result Value Ref Range Status   Specimen Description URINE, CATHETERIZED  Final   Special Requests Normal  Final   Culture   Final    NO GROWTH Performed at [facility], [address], [place], [place] [address]    Report Status 08/31/2019 FINAL  Final  Culture, blood (routine x 2)     Status: Abnormal   Collection Time: 08/28/19  4:52 PM   Specimen: BLOOD LEFT HAND  Result Value Ref Range Status   Specimen Description BLOOD LEFT HAND  Final   Special Requests AEROBIC BOTTLE ONLY Blood Culture adequate volume  Final   Culture  Setup Time   Final    AEROBIC BOTTLE ONLY GRAM POSITIVE COCCI CRITICAL RESULT CALLED TO, READ BACK BY AND VERIFIED WITH: C PIERCE PHARMD 08/30/19 0514 JDW    Culture (A)  Final    STAPHYLOCOCCUS AUREUS  SUSCEPTIBILITIES PERFORMED ON PREVIOUS CULTURE WITHIN THE LAST 5 DAYS. Performed at [facility], [address], [place], [place] [address]    Report Status 08/31/2019 FINAL  Final  Culture, blood (routine x 2)     Status: Abnormal   Collection Time: 08/28/19  4:52 PM   Specimen: BLOOD LEFT HAND  Result Value Ref Range Status   Specimen Description BLOOD LEFT HAND  Final   Special Requests AEROBIC BOTTLE ONLY Blood Culture adequate volume  Final   Culture  Setup Time   Final    GRAM POSITIVE COCCI AEROBIC BOTTLE ONLY CRITICAL VALUE NOTED.  VALUE IS CONSISTENT WITH PREVIOUSLY REPORTED AND CALLED VALUE.    Culture (A)  Final    STAPHYLOCOCCUS AUREUS SUSCEPTIBILITIES PERFORMED ON PREVIOUS CULTURE WITHIN THE LAST 5 DAYS. Performed at [facility], [address] [place], [place] [address]    Report Status 08/31/2019 FINAL  Final  Blood Culture ID Panel (Reflexed)     Status: Abnormal   Collection Time: 08/28/19  4:52 PM  Result Value Ref Range Status   Enterococcus species NOT DETECTED NOT DETECTED Final   Listeria monocytogenes NOT DETECTED NOT DETECTED Final   Staphylococcus species DETECTED (A) NOT DETECTED Final    Comment: CRITICAL RESULT CALLED TO, READ BACK BY AND VERIFIED WITH: C PIERCE PHARMD 08/30/19 0514 JDW    Staphylococcus aureus (BCID) DETECTED (A) NOT DETECTED Final    Comment: Methicillin (oxacillin)-resistant Staphylococcus aureus (MRSA). MRSA is predictably resistant to beta-lactam antibiotics (except ceftaroline). Preferred therapy is vancomycin unless clinically contraindicated. Patient requires contact precautions if  hospitalized. CRITICAL RESULT CALLED TO, READ BACK BY AND VERIFIED WITH: C PIERCE PHARMD 08/30/19 0514 JDW    Methicillin resistance DETECTED (A) NOT DETECTED Final    Comment: CRITICAL RESULT CALLED TO, READ BACK BY AND VERIFIED WITH: C PIERCE PHARMD 08/30/19 0514 JDW    Streptococcus species NOT DETECTED NOT DETECTED Final    Streptococcus agalactiae NOT DETECTED NOT DETECTED Final   Streptococcus pneumoniae NOT DETECTED NOT DETECTED Final   Streptococcus pyogenes NOT DETECTED NOT DETECTED Final   Acinetobacter baumannii NOT DETECTED NOT DETECTED Final   Enterobacteriaceae species NOT DETECTED NOT DETECTED Final   Enterobacter  cloacae complex NOT DETECTED NOT DETECTED Final   Escherichia coli NOT DETECTED NOT DETECTED Final   Klebsiella oxytoca NOT DETECTED NOT DETECTED Final   Klebsiella pneumoniae NOT DETECTED NOT DETECTED Final   Proteus species NOT DETECTED NOT DETECTED Final   Serratia marcescens NOT DETECTED NOT DETECTED Final   Haemophilus influenzae NOT DETECTED NOT DETECTED Final   Neisseria meningitidis NOT DETECTED NOT DETECTED Final   Pseudomonas aeruginosa NOT DETECTED NOT DETECTED Final   Candida albicans NOT DETECTED NOT DETECTED Final   Candida glabrata NOT DETECTED NOT DETECTED Final   Candida krusei NOT DETECTED NOT DETECTED Final   Candida parapsilosis NOT DETECTED NOT DETECTED Final   Candida tropicalis NOT DETECTED NOT DETECTED Final    Comment: Performed at [facility], [address], [place], [place] [address]  Culture, blood (routine x 2)     Status: Abnormal   Collection Time: 08/28/19  8:34 PM   Specimen: BLOOD LEFT HAND  Result Value Ref Range Status   Specimen Description BLOOD LEFT HAND  Final   Special Requests   Final    BOTTLES DRAWN AEROBIC ONLY Blood Culture results may not be optimal due to an inadequate volume of blood received in culture bottles   Culture  Setup Time   Final    AEROBIC BOTTLE ONLY GRAM POSITIVE COCCI CRITICAL VALUE NOTED.  VALUE IS CONSISTENT WITH PREVIOUSLY REPORTED AND CALLED VALUE.    Culture (A)  Final    STAPHYLOCOCCUS AUREUS SUSCEPTIBILITIES PERFORMED ON PREVIOUS CULTURE WITHIN THE LAST 5 DAYS. Performed at [facility], [address], [place], [place] [address]    Report Status 08/31/2019 FINAL  Final  Culture, blood (routine  x 2)     Status: Abnormal   Collection Time: 08/28/19  8:34 PM   Specimen: BLOOD RIGHT HAND  Result Value Ref Range Status   Specimen Description BLOOD RIGHT HAND  Final   Special Requests   Final    BOTTLES DRAWN AEROBIC ONLY Blood Culture results may not be optimal due to an inadequate volume of blood received in culture bottles   Culture  Setup Time   Final    GRAM POSITIVE COCCI IN CLUSTERS AEROBIC BOTTLE ONLY CRITICAL RESULT CALLED TO, READ BACK BY AND VERIFIED WITH: PHARMD CHRIS WALSTON [phone] [id number] FCP Performed at [facility], [address] [place], [place] [address]    Culture METHICILLIN RESISTANT STAPHYLOCOCCUS AUREUS (A)  Final   Report Status 08/31/2019 FINAL  Final   Organism ID, Bacteria METHICILLIN RESISTANT STAPHYLOCOCCUS AUREUS  Final      Susceptibility   Methicillin resistant staphylococcus aureus - MIC*    CIPROFLOXACIN >=8 RESISTANT Resistant     ERYTHROMYCIN >=8 RESISTANT Resistant     GENTAMICIN <=0.5 SENSITIVE Sensitive     OXACILLIN >=4 RESISTANT Resistant     TETRACYCLINE <=1 SENSITIVE Sensitive     VANCOMYCIN 1 SENSITIVE Sensitive     TRIMETH/SULFA <=10 SENSITIVE Sensitive     CLINDAMYCIN <=0.25 SENSITIVE Sensitive     RIFAMPIN <=0.5 SENSITIVE Sensitive     Inducible Clindamycin NEGATIVE Sensitive     * METHICILLIN RESISTANT STAPHYLOCOCCUS AUREUS  Culture, blood (routine x 2)     Status: None (Preliminary result)   Collection Time: 09/01/19  9:03 AM   Specimen: BLOOD  Result Value Ref Range Status   Specimen Description BLOOD RIGHT ANTECUBITAL  Final   Special Requests   Final    BOTTLES DRAWN AEROBIC AND ANAEROBIC  Blood Culture adequate volume   Culture   Final    NO GROWTH 2 DAYS Performed at [facility], [address], [place], [place] [address]    Report Status PENDING  Incomplete  Culture, blood (routine x 2)     Status: None (Preliminary result)   Collection Time: 09/01/19  9:08 AM   Specimen: BLOOD LEFT HAND  Result Value  Ref Range Status   Specimen Description BLOOD LEFT HAND  Final   Special Requests AEROBIC BOTTLE ONLY Blood Culture adequate volume  Final   Culture   Final    NO GROWTH 2 DAYS Performed at [facility], [address] [place], [place] [address]    Report Status PENDING  Incomplete         Radiology Studies: No results found.      Scheduled Meds: . feeding supplement (OSMOLITE 1.5 CAL)  1,000 mL Per Tube Q24H  . feeding supplement (PRO-STAT SUGAR FREE 64)  30 mL Per Tube BID  . folic acid  1 mg Oral Daily  . Gerhardt's butt cream   Topical Daily  . LORazepam  1 mg Intravenous Once  . multivitamin with minerals  1 tablet Per Tube Daily  . thiamine  100 mg Oral Daily   Or  . thiamine  100 mg Intravenous Daily   Continuous Infusions: . sodium chloride Stopped (09/01/19 0422)  . dextrose 5 % and 0.45% NaCl 1,000 mL with potassium chloride 10 mEq infusion 125 mL/hr at 09/03/19 1121  . folic acid (FOLVITE) IVPB Stopped (09/02/19 2202)  . vancomycin 1,000 mg (09/03/19 0409)     LOS: 6 days        Kathlen Mody, MD [facility] 09/03/2019, 12:15 PM

## 2019-09-03 NOTE — Progress Notes (Signed)
  Speech Language Pathology Treatment: Dysphagia  Patient Details Name: Ryan Lane MRN: [id number] DOB: [birth year] Today's Date: 09/03/2019 Time: 9476-5465 SLP Time Calculation (min) (ACUTE ONLY): 10 min  Assessment / Plan / Recommendation Clinical Impression  Today pt alert and cooperative.  Remains dysarthric but able to verbalize.  Pt with immediate cough post-swallow of ice chip bolus - able to propel secretions into oral cavity for SLP to orally suction, ? if due to pharyngeal secretion retention and/or residuals of po.  Improved tolerance of tsps nectar liquids noted without immediate coughing or complaint of residuals.     He is ble to elicit a swallow today and demonstrates a strong cough - thus will proceed with MBS to help determine if po diet is appropriate and possibly helpful aspiration mitigation strategies.  Pt educated and agreeable to plan for MBS - order placed and NT informed.    HPI HPI: Pt is a 68 y.o. M with significant PMH of alcohol and polysubstance abuse who presents to ER with fever, chills, body aches and diarrhea. Admitted with pneumonia/bacteremia with MRSA, transferred to [facility] on 08/28/2019 for further treatment and possible TEE. CT chest/abd/pelvis showing left psoas intramuscular hematoma with evidence of small active bleed. 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       SLP Plan  Continue with current plan of care;MBS(MBS today)       Recommendations  Diet recommendations: NPO Medication Administration: Via alternative means                General recommendations: Other(comment) Oral Care Recommendations: Oral care QID;Oral care prior to ice chip/H20 SLP Visit Diagnosis: Dysphagia, oropharyngeal phase (R13.12) Plan: Continue with current plan of care;MBS(MBS today)       GO              Luanna Salk, MS [facility] SLP Acute Rehab Services Pager [phone] Office [phone]   Ryan Lane 09/03/2019,  8:04 AM

## 2019-09-03 NOTE — Progress Notes (Signed)
NEUROLOGY PROGRESS NOTE  Subjective: Patient at this time is awake and alert.  Major complaint is that his neck is stiff.  No other complaints at this time.  Continues to be treated with vancomycin.  Exam: Vitals:   09/03/19 0747 09/03/19 0937  BP: 140/77 135/73  Pulse: 98 (!) 104  Resp: (!) 21 (!) 21  Temp: 98.7 F (37.1 C) 98.6 F (37 C)  SpO2: 100% 100%    ROS General ROS: negative for - chills, fatigue, fever, night sweats, weight gain or weight loss Psychological ROS: negative for - behavioral disorder, hallucinations, memory difficulties, mood swings or suicidal ideation Ophthalmic ROS: negative for - blurry vision, double vision, eye pain or loss of vision ENT ROS: negative for - epistaxis, nasal discharge, oral lesions, sore throat, tinnitus or vertigo Respiratory ROS: negative for - cough, hemoptysis, shortness of breath or wheezing Cardiovascular ROS: negative for - chest pain, dyspnea on exertion, edema or irregular heartbeat Gastrointestinal ROS: negative for - abdominal pain, diarrhea, hematemesis, nausea/vomiting or stool incontinence Genito-Urinary ROS: negative for - dysuria, hematuria, incontinence or urinary frequency/urgency Musculoskeletal ROS: Positive for -neck stiffness Neurological ROS: as noted in HPI Dermatological ROS: negative for rash and skin lesion changes    Physical Exam  Constitutional: Appears well-developed and well-nourished.  Psych: Affect appropriate to situation Eyes: No scleral injection HENT: No OP obstrucion Head: Normocephalic.  Cardiovascular: Normal rate and regular rhythm.  Respiratory: Effort normal, non-labored breathing GI: Soft.  No distension. There is no tenderness.  Skin: WDI  Neuro:  Mental Status: Alert, oriented, thought content appropriate.  Speech fluent without evidence of aphasia.  Follows simple commands  cranial Nerves: II:  Visual fields grossly normal, PERRL III,IV, VI: ptosis not present, extra-ocular  motions intact bilaterally  V,VII: smile symmetric, facial light touch sensation normal bilaterally VIII: hearing normal bilaterally Motor: Lifting lower extremities antigravity with no difficulty.  Able to flex arms with 4+/5 strength at elbows; however, cannot lift arms off bed at shoulders Sensory: Pinprick and light touch intact throughout, bilaterally   Medications:  Scheduled: . feeding supplement (OSMOLITE 1.5 CAL)  1,000 mL Per Tube Q24H  . feeding supplement (PRO-STAT SUGAR FREE 64)  30 mL Per Tube BID  . folic acid  1 mg Oral Daily  . Gerhardt's butt cream   Topical Daily  . LORazepam  1 mg Intravenous Once  . multivitamin with minerals  1 tablet Per Tube Daily  . thiamine  100 mg Oral Daily   Or  . thiamine  100 mg Intravenous Daily    Pertinent Labs/Diagnostics: TEE: Shows no obvious endocarditis.  Negative for PFO.  No LAA thrombus.  Moderate left ventricular hypertrophy.  Left ventricular ejection fraction 65-70%.  WBC trending down now at 15.9 Albumin 1.3 AST 61 ALT 49 Thiamine remains pending  EEG: Showed no seizures or epileptiform discharges.   Etta Quill PA-C [facility] [phone]   Assessment/Recommendations: 68 year old male with altered mental status in the setting of MRSA bacteremia.   1. The patient has improved to the point where he is alert and oriented and can follow commands.   2. Currently being treated with vancomycin.   3. TEE did not show obvious endocarditis with no LAA thrombus. 4. It was suspected that his presentation represents  persistent septicencephalopathyvsmeningitis. An LP wouldn't really change management much given that he is already on treatment for MRSA bacteremia with vancomycin at a dose that would be adequate coverage. 5. Neurology will sign off for now.  Discussed with Dr. Blake Divine.   6. Please call neurology when MRI of thoracic, cervical and brain have been obtained.  Electronically signed: Dr. Caryl Pina 09/03/2019, 10:05 AM

## 2019-09-03 NOTE — Progress Notes (Signed)
Physical Therapy Treatment Patient Details Name: Ryan Lane MRN: [id number] DOB: June 29, 1951 Today's Date: 09/03/2019    History of Present Illness 68 y.o. M with significant PMH of alcohol and polysubstance abuse who presents to ER with fever, chills, body aches and diarrhea. Admitted with pneumonia/bacteremia with MRSA, transferred to [facility] on 08/28/2019 for further treatment and possible TEE. CT chest/abd/pelvis showing left psoas intramuscular hematoma with evidence of small active bleed. 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     PT Comments    Pt progressing well towards his goals today; more alert and following simple commands. Received with bowel incontinence and performed bed mobility for peri care and linen change. Requiring two person minimal assist for squat pivot to chair. Suspect can progress pre gait training next session with continued improved arousal.     Follow Up Recommendations  SNF     Equipment Recommendations  Other (comment)(tbd)    Recommendations for Other Services       Precautions / Restrictions Precautions Precautions: Fall;Other (comment) Precaution Comments: NG tube Restrictions Weight Bearing Restrictions: No    Mobility  Bed Mobility Overal bed mobility: Needs Assistance Bed Mobility: Rolling;Supine to Sit Rolling: Mod assist   Supine to sit: Mod assist     General bed mobility comments: pt rolling to right and left for peri care with modA. modA to pull to sit up from edge of bed towards right  Transfers Overall transfer level: Needs assistance Equipment used: None Transfers: Squat Pivot Transfers     Squat pivot transfers: Min assist;+2 physical assistance;+2 safety/equipment     General transfer comment: minA + 2 for squat pivot transfer towards left, increased trunk flexion  Ambulation/Gait                 Stairs             Wheelchair Mobility    Modified Rankin (Stroke Patients  Only) Modified Rankin (Stroke Patients Only) Pre-Morbid Rankin Score: No symptoms Modified Rankin: Severe disability     Balance Overall balance assessment: Needs assistance Sitting-balance support: Feet supported Sitting balance-Leahy Scale: Fair Sitting balance - Comments: supervision for safety, increased cervical flexion                                    Cognition Arousal/Alertness: Awake/alert Behavior During Therapy: Flat affect Overall Cognitive Status: Impaired/Different from baseline Area of Impairment: Orientation;Attention;Memory;Following commands;Safety/judgement;Awareness;Problem solving                 Orientation Level: Time;Situation Current Attention Level: Sustained Memory: Decreased short-term memory Following Commands: Follows one step commands consistently Safety/Judgement: Decreased awareness of safety;Decreased awareness of deficits Awareness: Emergent Problem Solving: Slow processing;Decreased initiation;Requires verbal cues;Requires tactile cues;Difficulty sequencing General Comments: Pt more alert today after initial arousal; following all commands with some delayed processing noted. Oriented to place; asking appropriate questions about diagnosis when provided by PT.       Exercises      General Comments        Pertinent Vitals/Pain Pain Assessment: Faces Faces Pain Scale: No hurt    Home Living                      Prior Function            PT Goals (current goals can now be found in the care plan section) Acute Rehab PT Goals Patient  Stated Goal: none stated Potential to Achieve Goals: Fair Progress towards PT goals: Progressing toward goals    Frequency    Min 3X/week      PT Plan Current plan remains appropriate    Co-evaluation              AM-PAC PT "6 Clicks" Mobility   Outcome Measure  Help needed turning from your back to your side while in a flat bed without using bedrails?: A  Lot Help needed moving from lying on your back to sitting on the side of a flat bed without using bedrails?: A Lot Help needed moving to and from a bed to a chair (including a wheelchair)?: A Little Help needed standing up from a chair using your arms (e.g., wheelchair or bedside chair)?: A Lot Help needed to walk in hospital room?: A Lot Help needed climbing 3-5 steps with a railing? : Total 6 Click Score: 12    End of Session Equipment Utilized During Treatment: Oxygen Activity Tolerance: Patient tolerated treatment well Patient left: in chair;with call bell/phone within reach;with chair alarm set Nurse Communication: Mobility status PT Visit Diagnosis: Other abnormalities of gait and mobility (R26.89)     Time: 4540-9811 PT Time Calculation (min) (ACUTE ONLY): 31 min  Charges:  $Therapeutic Activity: 23-37 mins                     Ellamae Sia, PT, DPT Acute Rehabilitation Services Pager [phone] Office [phone]    Willy Eddy 09/03/2019, 4:45 PM

## 2019-09-03 NOTE — Progress Notes (Signed)
Pt has elevated temperature. MD notified, RN will administer tylenol.

## 2019-09-03 NOTE — Progress Notes (Signed)
Pharmacy Antibiotic Note  Ryan Lane is a 68 y.o. male admitted on 08/28/2019 with MRSA bacteremia.  Pharmacy has been consulted for Vancomycin dosing.    The patient's renal function has remained stable. A calculated AUC on 11/14-11/5 was therapeutic. WBC is downtrending, pt is afebrile, and repeat blood cultures show ngtd. TEE did not show evidence of vegetations per filed report however patient may still require prolonged therapy - will f/u with ID on plans LOT.   Plan: Continue vancomycin 1000 mg IV q12hr Will plan to repeat on 11/21 when the next note is due Will continue to follow renal function, culture results, LOT, and antibiotic de-escalation plans   Weight: 149 lb 0.5 oz (67.6 kg)  Temp (24hrs), Avg:98.5 F (36.9 C), Min:98 F (36.7 C), Max:98.9 F (37.2 C)  Recent Labs  Lab 08/28/19 0525 08/28/19 0627  08/29/19 0724 08/30/19 1800 08/30/19 1913 08/31/19 0336 09/01/19 0908 09/02/19 0334 09/03/19 0340  WBC  --  31.7*  --  31.9*  --  24.8* 25.6* 21.1* 16.9* 15.9*  CREATININE  --   --    < > 0.81  --   --  0.58* 0.50* 0.50* 0.47*  LATICACIDVEN 2.1* 2.2*  --   --   --   --   --   --   --   --   VANCOTROUGH  --   --   --   --   --   --  13*  --   --   --   VANCOPEAK  --   --   --   --  27*  --   --   --   --   --    < > = values in this interval not displayed.    CrCl cannot be calculated (Unknown ideal weight.).    No Known Allergies  Antimicrobials this admission: Vanc 11/12 >>  Dose adjustments: 11/15: VP=27, VT=13; AUC 464.1 - cont 1000 q12h  Microbiology: 11/12 bcx - MRSA 11/12 UCx - ngF 11/16 BCx >> ngtd  Thank you for allowing pharmacy to be a part of this patient's care.  Alycia Rossetti, PharmD, BCPS Clinical Pharmacist Clinical phone for 09/03/2019: [phone] 09/03/2019 10:33 AM   **Pharmacist phone directory can now be found on [email or web address] ([email or web address]).  Listed under [place].

## 2019-09-03 NOTE — Progress Notes (Addendum)
Palliative: Chart reviewed, Ryan Lane remains stable.  MBBS noted. Continuing treatment for endocarditis per ID.  He will require at least 6 weeks of IV antimicrobial therapy.   Palliative team to continue to follow.  Plan:  Ryan Lane remains stable.  Continue to treat the treatable, but no extraordinary measures.  Agreeable to rehab if able.  Agreeable to out patient palliative care. Discharge options being reviewed.   No charge  Quinn Axe, NP Palliative Medicine Team Team Phone # [phone] Greater than 50% of this time was spent counseling and coordinating care related to the above assessment and plan.

## 2019-09-03 NOTE — Progress Notes (Signed)
Modified Barium Swallow Progress Note  Patient Details  Name: Ryan Lane MRN: [id number] Date of Birth: 05-19-1951  Today's Date: 09/03/2019  Modified Barium Swallow completed.  Full report located under Chart Review in the Imaging Section.  Brief recommendations include the following:  Clinical Impression  Patient presents with moderate pharyngeal dysphagia with sensorimotor deficits.  Decreased oral propulsion with impaired tongue base retraction and mildly decreased laryngeal closure results in mild pharyngeal retention and aspiration of thin via tsp and straw.  Did not test head turn and pt unable to perform chin tuck adequately due to discomfort.  Cued effortful and dry swallows effective to diminish resdiuals.  Given pt's residuals in phayrnx, do not recommend solids at tihs time.  Full liquid, nectar and tsps thin water between meals after oral care advised.  Using teach back, SLP educated pt to compensation strategies and recommendations.   Swallow Evaluation Recommendations       SLP Diet Recommendations: Thin liquid;Nectar thick liquid   Liquid Administration via: Straw   Medication Administration: Whole meds with puree(with pudding)   Supervision: Patient able to self feed   Compensations: Slow rate;Small sips/bites(effortful swallow, cough/hock to clear pharyngeal retention)   Postural Changes: Remain semi-upright after after feeds/meals (Comment);Seated upright at 90 degrees   Oral Care Recommendations: Oral care QID       Ryan Salk, MS [facility] SLP [place] Pager [phone] Office [phone]  Ryan Lane 09/03/2019,10:06 AM

## 2019-09-03 NOTE — Progress Notes (Signed)
[facility] for Infectious Disease  Date of Admission:  08/28/2019     Total days of antibiotics 7         ASSESSMENT:  Mr. 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  Repeat blood cultures from 09/01/2019 are without growth to date.  Recommend holding central line placement for another 24 hours given his previous persistent bacteremia.  Renal function stable with vancomycin.  Depending upon goals of care he will require at least 6 weeks of IV antimicrobial therapy.  Continue current dose of vancomycin.  PLAN:  1. Continue current dose of vancomycin. 2. Hold on central line/PICC placement for additional 24 hours until cultures remain clear. 3. We will need prolonged therapy with vancomycin ability 6 weeks pending goals of care.  Principal Problem:   MRSA bacteremia Active Problems:   Alcohol withdrawal ([place])   Acute CVA (cerebrovascular accident) ([place])   Hyponatremia   Dyspnea and respiratory abnormalities   Palliative care encounter   Encounter for hospice care discussion   Goals of care, counseling/discussion   Palliative care by specialist   . feeding supplement (OSMOLITE 1.5 CAL)  1,000 mL Per Tube Q24H  . feeding supplement (PRO-STAT SUGAR FREE 64)  30 mL Per Tube BID  . folic acid  1 mg Oral Daily  . Gerhardt's butt cream   Topical Daily  . LORazepam  1 mg Intravenous Once  . [START ON 09/04/2019] multivitamin with minerals  1 tablet Oral Daily  . thiamine  100 mg Oral Daily   Or  . thiamine  100 mg Intravenous Daily    SUBJECTIVE:  Afebrile overnight with no acute events.  Leukocytosis stable.  Would like something to eat.   No Known Allergies   Review of Systems: Review of Systems  Constitutional: Negative for chills, fever and weight loss.  Respiratory: Negative for cough, shortness of breath and wheezing.   Cardiovascular: Negative for  chest pain and leg swelling.  Gastrointestinal: Negative for abdominal pain, constipation, diarrhea, nausea and vomiting.    OBJECTIVE: Vitals:   09/03/19 0401 09/03/19 0747 09/03/19 0937 09/03/19 1134  BP: 134/71 140/77 135/73 135/79  Pulse: 98 98 (!) 104 (!) 110  Resp: 18 (!) 21 (!) 21 (!) 22  Temp: 98.1 F (36.7 C) 98.7 F (37.1 C) 98.6 F (37 C) (!) 100.7 F (38.2 C)  TempSrc: Oral Oral  Oral  SpO2: 99% 100% 100% 98%  Weight: 67.6 kg      There is no height or weight on file to calculate BMI.  Physical Exam Constitutional:      General: He is not in acute distress.    Appearance: He is well-developed.     Comments: Sleeping on arrival, easily aroused and remains lethargic; lying in the bed with head of bed elevated.  Cardiovascular:     Rate and Rhythm: Regular rhythm. Tachycardia present.     Heart sounds: Murmur present.  Pulmonary:     Effort: Pulmonary effort is normal.     Breath sounds: Normal breath sounds.  Abdominal:     General: Bowel sounds are normal.  Skin:    General: Skin is warm and dry.  Neurological:     Mental Status: He is alert.     Comments: Left-sided facial droop     Lab Results Lab Results  Component Value Date   WBC 15.9 (H) 09/03/2019   HGB 8.8 (L)  09/03/2019   HCT 26.0 (L) 09/03/2019   MCV 94.5 09/03/2019   PLT 363 09/03/2019    Lab Results  Component Value Date   CREATININE 0.47 (L) 09/03/2019   BUN 8 09/03/2019   NA 135 09/03/2019   K 3.5 09/03/2019   CL 102 09/03/2019   CO2 27 09/03/2019    Lab Results  Component Value Date   ALT 49 (H) 09/03/2019   AST 61 (H) 09/03/2019   ALKPHOS 64 09/03/2019   BILITOT 0.5 09/03/2019     Microbiology: Recent Results (from the past 240 hour(s))  Culture, Urine     Status: None   Collection Time: 08/28/19  1:07 AM   Specimen: Urine, Catheterized  Result Value Ref Range Status   Specimen Description URINE, CATHETERIZED  Final   Special Requests Normal  Final   Culture    Final    NO GROWTH Performed at [facility], [address] [place], [place] [address]    Report Status 08/31/2019 FINAL  Final  Culture, blood (routine x 2)     Status: Abnormal   Collection Time: 08/28/19  4:52 PM   Specimen: BLOOD LEFT HAND  Result Value Ref Range Status   Specimen Description BLOOD LEFT HAND  Final   Special Requests AEROBIC BOTTLE ONLY Blood Culture adequate volume  Final   Culture  Setup Time   Final    AEROBIC BOTTLE ONLY GRAM POSITIVE COCCI CRITICAL RESULT CALLED TO, READ BACK BY AND VERIFIED WITH: C PIERCE PHARMD 08/30/19 0514 JDW    Culture (A)  Final    STAPHYLOCOCCUS AUREUS SUSCEPTIBILITIES PERFORMED ON PREVIOUS CULTURE WITHIN THE LAST 5 DAYS. Performed at [facility], [address] [place], [place] [address]    Report Status 08/31/2019 FINAL  Final  Culture, blood (routine x 2)     Status: Abnormal   Collection Time: 08/28/19  4:52 PM   Specimen: BLOOD LEFT HAND  Result Value Ref Range Status   Specimen Description BLOOD LEFT HAND  Final   Special Requests AEROBIC BOTTLE ONLY Blood Culture adequate volume  Final   Culture  Setup Time   Final    GRAM POSITIVE COCCI AEROBIC BOTTLE ONLY CRITICAL VALUE NOTED.  VALUE IS CONSISTENT WITH PREVIOUSLY REPORTED AND CALLED VALUE.    Culture (A)  Final    STAPHYLOCOCCUS AUREUS SUSCEPTIBILITIES PERFORMED ON PREVIOUS CULTURE WITHIN THE LAST 5 DAYS. Performed at [facility], [address] [place], [place] [address]    Report Status 08/31/2019 FINAL  Final  Blood Culture ID Panel (Reflexed)     Status: Abnormal   Collection Time: 08/28/19  4:52 PM  Result Value Ref Range Status   Enterococcus species NOT DETECTED NOT DETECTED Final   Listeria monocytogenes NOT DETECTED NOT DETECTED Final   Staphylococcus species DETECTED (A) NOT DETECTED Final    Comment: CRITICAL RESULT CALLED TO, READ BACK BY AND VERIFIED WITH: C PIERCE PHARMD 08/30/19 0514 JDW    Staphylococcus aureus (BCID)  DETECTED (A) NOT DETECTED Final    Comment: Methicillin (oxacillin)-resistant Staphylococcus aureus (MRSA). MRSA is predictably resistant to beta-lactam antibiotics (except ceftaroline). Preferred therapy is vancomycin unless clinically contraindicated. Patient requires contact precautions if  hospitalized. CRITICAL RESULT CALLED TO, READ BACK BY AND VERIFIED WITH: C PIERCE PHARMD 08/30/19 0514 JDW    Methicillin resistance DETECTED (A) NOT DETECTED Final    Comment: CRITICAL RESULT CALLED TO, READ BACK BY AND VERIFIED WITH: C PIERCE PHARMD 08/30/19 0514  JDW    Streptococcus species NOT DETECTED NOT DETECTED Final   Streptococcus agalactiae NOT DETECTED NOT DETECTED Final   Streptococcus pneumoniae NOT DETECTED NOT DETECTED Final   Streptococcus pyogenes NOT DETECTED NOT DETECTED Final   Acinetobacter baumannii NOT DETECTED NOT DETECTED Final   Enterobacteriaceae species NOT DETECTED NOT DETECTED Final   Enterobacter cloacae complex NOT DETECTED NOT DETECTED Final   Escherichia coli NOT DETECTED NOT DETECTED Final   Klebsiella oxytoca NOT DETECTED NOT DETECTED Final   Klebsiella pneumoniae NOT DETECTED NOT DETECTED Final   Proteus species NOT DETECTED NOT DETECTED Final   Serratia marcescens NOT DETECTED NOT DETECTED Final   Haemophilus influenzae NOT DETECTED NOT DETECTED Final   Neisseria meningitidis NOT DETECTED NOT DETECTED Final   Pseudomonas aeruginosa NOT DETECTED NOT DETECTED Final   Candida albicans NOT DETECTED NOT DETECTED Final   Candida glabrata NOT DETECTED NOT DETECTED Final   Candida krusei NOT DETECTED NOT DETECTED Final   Candida parapsilosis NOT DETECTED NOT DETECTED Final   Candida tropicalis NOT DETECTED NOT DETECTED Final    Comment: Performed at [facility], [address], [place], [place] [address]  Culture, blood (routine x 2)     Status: Abnormal   Collection Time: 08/28/19  8:34 PM   Specimen: BLOOD LEFT HAND  Result Value Ref Range Status    Specimen Description BLOOD LEFT HAND  Final   Special Requests   Final    BOTTLES DRAWN AEROBIC ONLY Blood Culture results may not be optimal due to an inadequate volume of blood received in culture bottles   Culture  Setup Time   Final    AEROBIC BOTTLE ONLY GRAM POSITIVE COCCI CRITICAL VALUE NOTED.  VALUE IS CONSISTENT WITH PREVIOUSLY REPORTED AND CALLED VALUE.    Culture (A)  Final    STAPHYLOCOCCUS AUREUS SUSCEPTIBILITIES PERFORMED ON PREVIOUS CULTURE WITHIN THE LAST 5 DAYS. Performed at [facility], [address], [place], [place] [address]    Report Status 08/31/2019 FINAL  Final  Culture, blood (routine x 2)     Status: Abnormal   Collection Time: 08/28/19  8:34 PM   Specimen: BLOOD RIGHT HAND  Result Value Ref Range Status   Specimen Description BLOOD RIGHT HAND  Final   Special Requests   Final    BOTTLES DRAWN AEROBIC ONLY Blood Culture results may not be optimal due to an inadequate volume of blood received in culture bottles   Culture  Setup Time   Final    GRAM POSITIVE COCCI IN CLUSTERS AEROBIC BOTTLE ONLY CRITICAL RESULT CALLED TO, READ BACK BY AND VERIFIED WITH: PHARMD CHRIS WALSTON [id number] FCP Performed at [facility], [address], [place], [place] [address]    Culture METHICILLIN RESISTANT STAPHYLOCOCCUS AUREUS (A)  Final   Report Status 08/31/2019 FINAL  Final   Organism ID, Bacteria METHICILLIN RESISTANT STAPHYLOCOCCUS AUREUS  Final      Susceptibility   Methicillin resistant staphylococcus aureus - MIC*    CIPROFLOXACIN >=8 RESISTANT Resistant     ERYTHROMYCIN >=8 RESISTANT Resistant     GENTAMICIN <=0.5 SENSITIVE Sensitive     OXACILLIN >=4 RESISTANT Resistant     TETRACYCLINE <=1 SENSITIVE Sensitive     VANCOMYCIN 1 SENSITIVE Sensitive     TRIMETH/SULFA <=10 SENSITIVE Sensitive     CLINDAMYCIN <=0.25 SENSITIVE Sensitive     RIFAMPIN <=0.5 SENSITIVE Sensitive     Inducible Clindamycin NEGATIVE Sensitive     * METHICILLIN  RESISTANT  STAPHYLOCOCCUS AUREUS  Culture, blood (routine x 2)     Status: None (Preliminary result)   Collection Time: 09/01/19  9:03 AM   Specimen: BLOOD  Result Value Ref Range Status   Specimen Description BLOOD RIGHT ANTECUBITAL  Final   Special Requests   Final    BOTTLES DRAWN AEROBIC AND ANAEROBIC Blood Culture adequate volume   Culture   Final    NO GROWTH 2 DAYS Performed at [facility], [address], [place], [place] [address]    Report Status PENDING  Incomplete  Culture, blood (routine x 2)     Status: None (Preliminary result)   Collection Time: 09/01/19  9:08 AM   Specimen: BLOOD LEFT HAND  Result Value Ref Range Status   Specimen Description BLOOD LEFT HAND  Final   Special Requests AEROBIC BOTTLE ONLY Blood Culture adequate volume  Final   Culture   Final    NO GROWTH 2 DAYS Performed at [facility], [address], [place], [place] [address]    Report Status PENDING  Incomplete     Marcos Eke, NP [facility] [phone] Pager  09/03/2019  1:34 PM

## 2019-09-04 LAB — CBC WITH DIFFERENTIAL/PLATELET
Abs Immature Granulocytes: 0.13 10*3/uL — ABNORMAL HIGH (ref 0.00–0.07)
Basophils Absolute: 0 10*3/uL (ref 0.0–0.1)
Basophils Relative: 0 %
Eosinophils Absolute: 0.1 10*3/uL (ref 0.0–0.5)
Eosinophils Relative: 1 %
HCT: 24.4 % — ABNORMAL LOW (ref 39.0–52.0)
Hemoglobin: 8.3 g/dL — ABNORMAL LOW (ref 13.0–17.0)
Immature Granulocytes: 1 %
Lymphocytes Relative: 11 %
Lymphs Abs: 1.6 10*3/uL (ref 0.7–4.0)
MCH: 32.4 pg (ref 26.0–34.0)
MCHC: 34 g/dL (ref 30.0–36.0)
MCV: 95.3 fL (ref 80.0–100.0)
Monocytes Absolute: 1.2 10*3/uL — ABNORMAL HIGH (ref 0.1–1.0)
Monocytes Relative: 8 %
Neutro Abs: 11.5 10*3/uL — ABNORMAL HIGH (ref 1.7–7.7)
Neutrophils Relative %: 79 %
Platelets: 457 10*3/uL — ABNORMAL HIGH (ref 150–400)
RBC: 2.56 MIL/uL — ABNORMAL LOW (ref 4.22–5.81)
RDW: 13 % (ref 11.5–15.5)
WBC: 14.5 10*3/uL — ABNORMAL HIGH (ref 4.0–10.5)
nRBC: 0 % (ref 0.0–0.2)

## 2019-09-04 LAB — GLUCOSE, CAPILLARY
Glucose-Capillary: 103 mg/dL — ABNORMAL HIGH (ref 70–99)
Glucose-Capillary: 103 mg/dL — ABNORMAL HIGH (ref 70–99)
Glucose-Capillary: 115 mg/dL — ABNORMAL HIGH (ref 70–99)
Glucose-Capillary: 116 mg/dL — ABNORMAL HIGH (ref 70–99)
Glucose-Capillary: 81 mg/dL (ref 70–99)

## 2019-09-04 LAB — VANCOMYCIN, PEAK: Vancomycin Pk: 18 ug/mL — ABNORMAL LOW (ref 30–40)

## 2019-09-04 MED ORDER — KCL IN DEXTROSE-NACL 10-5-0.45 MEQ/L-%-% IV SOLN
INTRAVENOUS | Status: DC
  Administered 2019-09-04 (×2): via INTRAVENOUS
  Filled 2019-09-04 (×4): qty 1000

## 2019-09-04 MED ORDER — FOLIC ACID 5 MG/ML IJ SOLN
1.0000 mg | Freq: Every day | INTRAMUSCULAR | Status: DC
  Administered 2019-09-06 – 2019-09-09 (×3): 1 mg via INTRAVENOUS
  Filled 2019-09-04 (×6): qty 0.2

## 2019-09-04 MED ORDER — FOLIC ACID 1 MG PO TABS
1.0000 mg | ORAL_TABLET | Freq: Every day | ORAL | Status: DC
  Administered 2019-09-04 – 2019-09-05 (×2): 1 mg via ORAL
  Filled 2019-09-04 (×4): qty 1

## 2019-09-04 NOTE — Progress Notes (Signed)
[facility] for Infectious Disease  Date of Admission:  08/28/2019     Total days of antibiotics 8         ASSESSMENT:  [name] continues to remain afebrile and blood cultures remain without growth to date.  Discharge planning appears to be to skilled nursing facility.  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  Creatinine has been stable with no evidence of nephrotoxicity and okay for PICC line placement as he prepares for discharge.  Continue current dose of vancomycin through 10/13/2019.  PLAN:  1. Continue current dose of vancomycin. 2. Monitor renal function twice weekly for nephrotoxicity while on vancomycin. 3. PICC line placement in the next 24 hours if cultures remain clear 4. OPAT orders placed. 5. Will arrange follow up in ID office.  Diagnosis: MRSA bacteremia / disseminated MRSA infection / CVA  Culture Result: MRSA  No Known Allergies  OPAT Orders Discharge antibiotics: Vancomycin  Per pharmacy protocol  Aim for Vancomycin trough 15-20 or AUC 400-550 (unless otherwise indicated) Duration: 6 weeks  End Date: 10/13/19  [facility] Care Per Protocol:  Labs weekly while on IV antibiotics: _X_ CBC with differential _X_ BMP (Biweekly) __ CMP __ CRP __ ESR _X_ Vancomycin trough __ CK  __ Please pull PIC at completion of IV antibiotics _X_ Please leave PIC in place until doctor has seen patient or been notified  Fax weekly labs to [phone]  Clinic Follow Up Appt:  09/18/19 at 2:45 pm with Terri Piedra, NP  Principal Problem:   MRSA bacteremia Active Problems:   Alcohol withdrawal ([place])   Acute CVA (cerebrovascular accident) ([place])   Hyponatremia   Dyspnea and respiratory abnormalities   Palliative care encounter   Encounter for hospice care discussion   Goals of care, counseling/discussion   Palliative care by specialist   . feeding supplement  (OSMOLITE 1.5 CAL)  1,000 mL Per Tube Q24H  . feeding supplement (PRO-STAT SUGAR FREE 64)  30 mL Per Tube BID  . folic acid  1 mg Oral Daily   Or  . folic acid  1 mg Intravenous Daily  . Gerhardt's butt cream   Topical Daily  . LORazepam  1 mg Intravenous Once  . multivitamin with minerals  1 tablet Oral Daily  . thiamine  100 mg Oral Daily   Or  . thiamine  100 mg Intravenous Daily    SUBJECTIVE:  Afebrile overnight with no acute events.  Repeat blood cultures remain without growth to date.  No Known Allergies   Review of Systems: Review of Systems  Constitutional: Negative for chills, fever and weight loss.  Respiratory: Negative for cough, shortness of breath and wheezing.   Cardiovascular: Negative for chest pain and leg swelling.  Gastrointestinal: Negative for abdominal pain, constipation, diarrhea, nausea and vomiting.  Skin: Negative for rash.    OBJECTIVE: Vitals:   09/04/19 0843 09/04/19 1132 09/04/19 1147 09/04/19 1226  BP: 136/68 (!) 144/70 136/69   Pulse: 98 92 97   Resp: (!) 24 19 (!) 24   Temp: 98.6 F (37 C) 99 F (37.2 C) 99 F (37.2 C) 98 F (36.7 C)  TempSrc: Oral Oral Oral Oral  SpO2: 100% 98% 98%   Weight:       There is no height or weight on file to calculate BMI.  Physical Exam Constitutional:      General: He is not in acute distress.  Appearance: He is well-developed.     Comments: Lying in bed with head of bed elevated  Cardiovascular:     Rate and Rhythm: Normal rate and regular rhythm.     Heart sounds: Normal heart sounds.  Pulmonary:     Effort: Pulmonary effort is normal.     Breath sounds: Normal breath sounds.  Skin:    General: Skin is warm and dry.  Neurological:     Mental Status: He is alert.     Comments: Left sided facial droop.     Lab Results Lab Results  Component Value Date   WBC 14.5 (H) 09/04/2019   HGB 8.3 (L) 09/04/2019   HCT 24.4 (L) 09/04/2019   MCV 95.3 09/04/2019   PLT 457 (H) 09/04/2019     Lab Results  Component Value Date   CREATININE 0.47 (L) 09/03/2019   BUN 8 09/03/2019   NA 135 09/03/2019   K 3.5 09/03/2019   CL 102 09/03/2019   CO2 27 09/03/2019    Lab Results  Component Value Date   ALT 49 (H) 09/03/2019   AST 61 (H) 09/03/2019   ALKPHOS 64 09/03/2019   BILITOT 0.5 09/03/2019     Microbiology: Recent Results (from the past 240 hour(s))  Culture, Urine     Status: None   Collection Time: 08/28/19  1:07 AM   Specimen: Urine, Catheterized  Result Value Ref Range Status   Specimen Description URINE, CATHETERIZED  Final   Special Requests Normal  Final   Culture   Final    NO GROWTH Performed at [facility], [address] [place], [place] [address]    Report Status 08/31/2019 FINAL  Final  Culture, blood (routine x 2)     Status: Abnormal   Collection Time: 08/28/19  4:52 PM   Specimen: BLOOD LEFT HAND  Result Value Ref Range Status   Specimen Description BLOOD LEFT HAND  Final   Special Requests AEROBIC BOTTLE ONLY Blood Culture adequate volume  Final   Culture  Setup Time   Final    AEROBIC BOTTLE ONLY GRAM POSITIVE COCCI CRITICAL RESULT CALLED TO, READ BACK BY AND VERIFIED WITH: C PIERCE PHARMD 08/30/19 0514 JDW    Culture (A)  Final    STAPHYLOCOCCUS AUREUS SUSCEPTIBILITIES PERFORMED ON PREVIOUS CULTURE WITHIN THE LAST 5 DAYS. Performed at [facility], [place] [address] [place], [place] [address]    Report Status 08/31/2019 FINAL  Final  Culture, blood (routine x 2)     Status: Abnormal   Collection Time: 08/28/19  4:52 PM   Specimen: BLOOD LEFT HAND  Result Value Ref Range Status   Specimen Description BLOOD LEFT HAND  Final   Special Requests AEROBIC BOTTLE ONLY Blood Culture adequate volume  Final   Culture  Setup Time   Final    GRAM POSITIVE COCCI AEROBIC BOTTLE ONLY CRITICAL VALUE NOTED.  VALUE IS CONSISTENT WITH PREVIOUSLY REPORTED AND CALLED VALUE.    Culture (A)  Final    STAPHYLOCOCCUS AUREUS SUSCEPTIBILITIES  PERFORMED ON PREVIOUS CULTURE WITHIN THE LAST 5 DAYS. Performed at [facility], [place] [address], [place], [place] [address]    Report Status 08/31/2019 FINAL  Final  Blood Culture ID Panel (Reflexed)     Status: Abnormal   Collection Time: 08/28/19  4:52 PM  Result Value Ref Range Status   Enterococcus species NOT DETECTED NOT DETECTED Final   Listeria monocytogenes NOT DETECTED NOT DETECTED Final  Staphylococcus species DETECTED (A) NOT DETECTED Final    Comment: CRITICAL RESULT CALLED TO, READ BACK BY AND VERIFIED WITH: C PIERCE PHARMD 08/30/19 0514 JDW    Staphylococcus aureus (BCID) DETECTED (A) NOT DETECTED Final    Comment: Methicillin (oxacillin)-resistant Staphylococcus aureus (MRSA). MRSA is predictably resistant to beta-lactam antibiotics (except ceftaroline). Preferred therapy is vancomycin unless clinically contraindicated. Patient requires contact precautions if  hospitalized. CRITICAL RESULT CALLED TO, READ BACK BY AND VERIFIED WITH: C PIERCE PHARMD 08/30/19 0514 JDW    Methicillin resistance DETECTED (A) NOT DETECTED Final    Comment: CRITICAL RESULT CALLED TO, READ BACK BY AND VERIFIED WITH: C PIERCE PHARMD 08/30/19 0514 JDW    Streptococcus species NOT DETECTED NOT DETECTED Final   Streptococcus agalactiae NOT DETECTED NOT DETECTED Final   Streptococcus pneumoniae NOT DETECTED NOT DETECTED Final   Streptococcus pyogenes NOT DETECTED NOT DETECTED Final   Acinetobacter baumannii NOT DETECTED NOT DETECTED Final   Enterobacteriaceae species NOT DETECTED NOT DETECTED Final   Enterobacter cloacae complex NOT DETECTED NOT DETECTED Final   Escherichia coli NOT DETECTED NOT DETECTED Final   Klebsiella oxytoca NOT DETECTED NOT DETECTED Final   Klebsiella pneumoniae NOT DETECTED NOT DETECTED Final   Proteus species NOT DETECTED NOT DETECTED Final   Serratia marcescens NOT DETECTED NOT DETECTED Final   Haemophilus influenzae NOT DETECTED NOT DETECTED Final   Neisseria  meningitidis NOT DETECTED NOT DETECTED Final   Pseudomonas aeruginosa NOT DETECTED NOT DETECTED Final   Candida albicans NOT DETECTED NOT DETECTED Final   Candida glabrata NOT DETECTED NOT DETECTED Final   Candida krusei NOT DETECTED NOT DETECTED Final   Candida parapsilosis NOT DETECTED NOT DETECTED Final   Candida tropicalis NOT DETECTED NOT DETECTED Final    Comment: Performed at [facility], [place]. [address] [place], [place] [address]  Culture, blood (routine x 2)     Status: Abnormal   Collection Time: 08/28/19  8:34 PM   Specimen: BLOOD LEFT HAND  Result Value Ref Range Status   Specimen Description BLOOD LEFT HAND  Final   Special Requests   Final    BOTTLES DRAWN AEROBIC ONLY Blood Culture results may not be optimal due to an inadequate volume of blood received in culture bottles   Culture  Setup Time   Final    AEROBIC BOTTLE ONLY GRAM POSITIVE COCCI CRITICAL VALUE NOTED.  VALUE IS CONSISTENT WITH PREVIOUSLY REPORTED AND CALLED VALUE.    Culture (A)  Final    STAPHYLOCOCCUS AUREUS SUSCEPTIBILITIES PERFORMED ON PREVIOUS CULTURE WITHIN THE LAST 5 DAYS. Performed at [facility], [place] [address], [place], [place] [address]    Report Status 08/31/2019 FINAL  Final  Culture, blood (routine x 2)     Status: Abnormal   Collection Time: 08/28/19  8:34 PM   Specimen: BLOOD RIGHT HAND  Result Value Ref Range Status   Specimen Description BLOOD RIGHT HAND  Final   Special Requests   Final    BOTTLES DRAWN AEROBIC ONLY Blood Culture results may not be optimal due to an inadequate volume of blood received in culture bottles   Culture  Setup Time   Final    GRAM POSITIVE COCCI IN CLUSTERS AEROBIC BOTTLE ONLY CRITICAL RESULT CALLED TO, READ BACK BY AND VERIFIED WITH: PHARMD CHRIS [place] [id number] FCP Performed at [place] [address], [place], [place] [address]    Culture METHICILLIN RESISTANT STAPHYLOCOCCUS AUREUS (A)  Final   Report  Status 08/31/2019  FINAL  Final   Organism ID, Bacteria METHICILLIN RESISTANT STAPHYLOCOCCUS AUREUS  Final      Susceptibility   Methicillin resistant staphylococcus aureus - MIC*    CIPROFLOXACIN >=8 RESISTANT Resistant     ERYTHROMYCIN >=8 RESISTANT Resistant     GENTAMICIN <=0.5 SENSITIVE Sensitive     OXACILLIN >=4 RESISTANT Resistant     TETRACYCLINE <=1 SENSITIVE Sensitive     VANCOMYCIN 1 SENSITIVE Sensitive     TRIMETH/SULFA <=10 SENSITIVE Sensitive     CLINDAMYCIN <=0.25 SENSITIVE Sensitive     RIFAMPIN <=0.5 SENSITIVE Sensitive     Inducible Clindamycin NEGATIVE Sensitive     * METHICILLIN RESISTANT STAPHYLOCOCCUS AUREUS  Culture, blood (routine x 2)     Status: None (Preliminary result)   Collection Time: 09/01/19  9:03 AM   Specimen: BLOOD  Result Value Ref Range Status   Specimen Description BLOOD RIGHT ANTECUBITAL  Final   Special Requests   Final    BOTTLES DRAWN AEROBIC AND ANAEROBIC Blood Culture adequate volume   Culture   Final    NO GROWTH 3 DAYS Performed at [facility], [address] [place], [place] [address]    Report Status PENDING  Incomplete  Culture, blood (routine x 2)     Status: None (Preliminary result)   Collection Time: 09/01/19  9:08 AM   Specimen: BLOOD LEFT HAND  Result Value Ref Range Status   Specimen Description BLOOD LEFT HAND  Final   Special Requests AEROBIC BOTTLE ONLY Blood Culture adequate volume  Final   Culture   Final    NO GROWTH 3 DAYS Performed at [facility], [place] [address] [place], [place] [address]    Report Status PENDING  Incomplete     Terri Piedra, [place] [facility] [place] [facility] [phone] Pager  09/04/2019  2:21 PM

## 2019-09-04 NOTE — TOC Progression Note (Signed)
Transition of Care [facility]) - Progression Note    Patient Details  Name: Ryan Lane MRN: [id number] Date of Birth: 1951-01-09  Transition of Care [facility]) CM/SW [place], [place] Work Phone Number: 09/04/2019, 2:07 PM  Clinical Narrative:    MSW Intern called PT's son, Roselyn Reef to provide choices of SNF placement options. Roselyn Reef was advised that pt was accepted at [facility] and [facility]. The contacts for the facilities were given and son said he would research and follow up with SW. SW will continue to follow.   Expected Discharge Plan: [place] Barriers to Discharge: Continued Medical Work up  Expected Discharge Plan and Services Expected Discharge Plan: [place] In-house Referral: Clinical Social Work Discharge Planning Services: NA Post Acute Care Choice: [place] Living arrangements for the past 2 months: Single Family Home                 DME Arranged: N/A DME Agency: NA       HH Arranged: NA HH Agency: NA         Social Determinants of Health (SDOH) Interventions    Readmission Risk Interventions No flowsheet data found.

## 2019-09-04 NOTE — Progress Notes (Signed)
  Speech Language Pathology Treatment:    Patient Details Name: Ryan Lane MRN: [id number] DOB: 12/15/1950 Today's Date: 09/04/2019 Time: 8099-8338 SLP Time Calculation (min) (ACUTE ONLY): 24 min  Assessment / Plan / Recommendation Clinical Impression  Pt reports not feeling well today stating he is "weak".  SLP assisted pt to brush his gums/tongue and clearned dentures - having pt place them in his mouth.  Dentures did not fit due to being too loose and were removed by pt.  Pt agreeable to consume some po intake (on full liquids nectar). Baseline congestion heard (subjectively sounds proximal pulmonary - not pharyngeal) and cough is strong with adequate ability to clear however laborious.  Immediate signficant cough post-swallow of ice chip today observed with mild distress but pt able to use oral suction independently to clear.  Intake has been poor and pt spiked a temperature yesterday.    Positioning is suboptimal given his decreased ability to hold head up - tends to lean toward the left even with cues to stay upright and frequent repositioning.  Question if he could benefit from cervical call to aid neck positioning???  SLP assisted pt to eat breakfast reinforcing effective compensation strategies with moderate verbal/visual and tactile cues.  He consumed po at rapid rate and will need full supervision to support intake incorporating aspiration mitigation strategies.  Able to demonstrate effortful swallow with mod I evidenced by his wincing.    Given pt's severe fatigue and ongoing chest congestion today - recommend he continue full liquid/nectar diet - pt agreeable to plan.  Using teach back he was able to verbalize aspiration and importance of cough/hock strength.  Note palliative care recommendations with not elevation of pt care plan.    HPI HPI: Pt is a 68 y.o. M with significant PMH of alcohol and polysubstance abuse who presents to ER with fever, chills, body aches and diarrhea.  Admitted with pneumonia/bacteremia with MRSA, transferred to [facility] on 08/28/2019 for further treatment and possible TEE. CT chest/abd/pelvis showing left psoas intramuscular hematoma with evidence of small active bleed. 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       SLP Plan  Continue with current plan of care;MBS(MBS today)       Recommendations  Diet recommendations: Nectar-thick liquid(full liquids/nectar) Medication Administration: Whole meds with puree Supervision: Patient able to self feed;Full supervision/cueing for compensatory strategies Compensations: Slow rate;Small sips/bites;Effortful swallow("cough" and "hock" to clear residuals he does not sense) Postural Changes and/or Swallow Maneuvers: Seated upright 90 degrees;Upright 30-60 min after meal                Oral Care Recommendations: Oral care QID;Oral care prior to ice chip/H20 SLP Visit Diagnosis: Dysphagia, oropharyngeal phase (R13.12) Plan: Continue with current plan of care;MBS(MBS today)       GO                Macario Golds 09/04/2019, 9:18 AM  Luanna Salk, MS [facility] SLP Acute Rehab Services Pager [phone] Office [phone]

## 2019-09-04 NOTE — Progress Notes (Signed)
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  He was transferred from [facility] to [facility] on November 12 for further evaluation and management.  He underwent a TEE which was negative for acute endocarditis.  Patient had persistent bacteremia with repeat blood cultures from 08/28/2019.  Blood cultures from 09/01/19 are negative so far.  Infectious disease is on board. Recommended 6 weeks of IV vancomycin for presumed endocarditis with CNS emboli and PICC line placement in the next 24 hours if cultures remain negative . The last date of IV Vancomycin is 12/28.   Assessment & Plan:   Principal Problem:   MRSA bacteremia Active Problems:   Alcohol withdrawal ([place])   Acute CVA (cerebrovascular accident) ([place])   Hyponatremia   Dyspnea and respiratory abnormalities   Palliative care encounter   Encounter for hospice care discussion   Goals of care, counseling/discussion   Palliative care by specialist  Sepsis secondary to MRSA bacteremia and pneumonia Persistent MRSA bacteremia on repeat blood cultures and persistent fevers. Patient is currently on IV vancomycin , ID consulted and recommended a totalof 6 weeks of treatment for presumed endocarditis and CNS emboli. Will plan for PICC placement if cultures remain negative.  Further evaluation of the source of persistent bacteremia with an MRI of the cervical and thoracic spine ordered but unfortunately could not be done as patient could not lay flat for the study to be performed.   Acute toxic/metabolic encephalopathy in the setting of MRSA bacteremia and pneumonia, stroke.  Neurology on board recommended an MRI of the brain but could  not be done as patient could not lay flat and tolerated.  An EEG did not show any epileptiform findings.  Another differential could be Wernicke's encephalopathy. Pt is currently awake and answering questions appropriately. He continues to have neck pain and stiffness , with slight torticollis.   Cerebellar CVA MRI from [facility] showed punctate right cerebellar and right corona radiata. Neurology is on board and suggested no indication of LP as it would not change the management of MRSA bacteremia and CNS emboli.    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   Left psoas intramuscular hematoma Patient's hemoglobin continues to drop from 11.6-11 0.1-9 0.4-8.8 to 8.3.  Aspirin and Lovenox injections are on hold due to intramuscular hematoma.    Hyponatremia  Resolved    Dysphagia Diet as per nutritionist    Hypokalemia Replaced   History of alcohol abuse No signs of withdrawal at this time.  Mildly elevated liver enzymes Continue to monitor.  Patient denies any nausea or vomiting at this time.   In view of poor functional status, clinical deterioration, persistent MRSA bacteremia and fevers palliative care consulted and is on board with discussions with the family.  He is currently DNR and further recommendations to follow.   DVT prophylaxis: SCDs Code Status: DNR Family Communication: None at bedside, will call the family and update Disposition Plan: Pending clinical improvement and further evaluation of persistent bacteremia and fevers.   Consultants:   Infectious disease  Neurology  Procedures: CT of the abdomen pelvis on admission Antimicrobials:  IV vancomycin  Subjective: No chest pain or sob. No new complaints.  Objective: Vitals:   09/04/19 0843 09/04/19 1132 09/04/19 1147 09/04/19 1226  BP: 136/68 (!) 144/70 136/69   Pulse: 98 92 97   Resp: (!) 24 19 (!) 24   Temp: 98.6 F (37 C) 99 F (37.2 C) 99 F (37.2 C)  98 F (36.7 C)  TempSrc: Oral Oral Oral Oral  SpO2: 100% 98% 98%   Weight:        Intake/Output Summary (Last 24 hours) at 09/04/2019 1631 Last data filed at 09/04/2019 0333 Gross per 24 hour  Intake 4200.7 ml  Output 1300 ml  Net 2900.7 ml   Filed Weights   09/02/19 0953 09/03/19 0401 09/04/19 0332  Weight: 63.8 kg 67.6 kg 67.5 kg    Examination:  General exam: well developed. Torticollis to the left.  Respiratory system: diminished at bases, no wheezing or rhonchi.  Cardiovascular system:S1S2, RRR, no JVD. Marland Kitchen Gastrointestinal system: abd is soft, non tender non distended. Bowel sounds good.  Central nervous system: alert and answering all questions appropriately.  Extremities: Trace edema Skin: no rashes.  Psychiatry: mood is appropriate.     Data Reviewed: I have personally reviewed following labs and imaging studies  CBC: Recent Labs  Lab 08/31/19 0336 09/01/19 0908 09/02/19 0334 09/03/19 0340 09/04/19 0359  WBC 25.6* 21.1* 16.9* 15.9* 14.5*  NEUTROABS  --  18.1* 13.9* 13.1* 11.5*  HGB 11.1* 10.7* 9.4* 8.8* 8.3*  HCT 32.2* 32.0* 27.6* 26.0* 24.4*  MCV 96.7 97.3 95.2 94.5 95.3  PLT 218 281 231 363 457*   Basic Metabolic Panel: Recent Labs  Lab 08/29/19 0724 08/31/19 0336 09/01/19 0908 09/02/19 0334 09/03/19 0340  NA 147* 147* 141 139 135  K 3.5 3.0* 3.2* 3.7 3.5  CL 110 110 104 105 102  CO2 29 27 28 26 27   GLUCOSE 160* 110* 121* 106* 110*  BUN 12 9 8 10 8   CREATININE 0.81 0.58* 0.50* 0.50* 0.47*  CALCIUM 8.0* 8.1* 8.0* 7.7* 7.7*  MG  --   --  1.9  --   --    GFR: CrCl cannot be calculated (Unknown ideal weight.). Liver Function Tests: Recent Labs  Lab 08/29/19 0724 09/01/19 0908 09/02/19 0334 09/03/19 0340  AST 28 50* 100* 61*  ALT 16 38 61* 49*  ALKPHOS 77 75 81 64  BILITOT 0.7 0.8 0.7 0.5  PROT 5.2* 5.7* 5.4* 5.3*  ALBUMIN 1.7* 1.5* 1.4* 1.3*   No results for input(s): LIPASE, AMYLASE in the last 168 hours. No results for  input(s): AMMONIA in the last 168 hours. Coagulation Profile: No results for input(s): INR, PROTIME in the last 168 hours. Cardiac Enzymes: No results for input(s): CKTOTAL, CKMB, CKMBINDEX, TROPONINI in the last 168 hours. BNP (last 3 results) No results for input(s): PROBNP in the last 8760 hours. HbA1C: No results for input(s): HGBA1C in the last 72 hours. CBG: Recent Labs  Lab 09/03/19 1640 09/03/19 1939 09/03/19 2330 09/04/19 0330 09/04/19 1141  GLUCAP 121* 145* 114* 115* 116*   Lipid Profile: No results for input(s): CHOL, HDL, LDLCALC, TRIG, CHOLHDL, LDLDIRECT in the last 72 hours. Thyroid Function Tests: No results for input(s): TSH, T4TOTAL, FREET4, T3FREE, THYROIDAB in the last 72 hours. Anemia Panel: No results for input(s): VITAMINB12, FOLATE, FERRITIN, TIBC, IRON, RETICCTPCT in the last 72 hours. Sepsis Labs: No results for input(s): PROCALCITON, LATICACIDVEN in the last 168 hours.  Recent Results (from the past 240 hour(s))  Culture, Urine     Status: None   Collection Time: 08/28/19  1:07 AM   Specimen: Urine, Catheterized  Result Value Ref Range Status   Specimen Description URINE, CATHETERIZED  Final   Special Requests Normal  Final   Culture   Final    NO GROWTH Performed at [facility], [address], [place], [place] [address]    Report Status 08/31/2019 FINAL  Final  Culture, blood (routine x 2)     Status: Abnormal   Collection Time: 08/28/19  4:52 PM   Specimen: BLOOD LEFT HAND  Result Value Ref Range Status   Specimen Description BLOOD LEFT HAND  Final   Special Requests AEROBIC BOTTLE ONLY Blood Culture adequate volume  Final   Culture  Setup Time   Final    AEROBIC BOTTLE ONLY GRAM POSITIVE COCCI CRITICAL RESULT CALLED TO, READ BACK BY AND VERIFIED WITH: C PIERCE PHARMD 08/30/19 0514 JDW    Culture (A)  Final    STAPHYLOCOCCUS AUREUS SUSCEPTIBILITIES PERFORMED ON PREVIOUS CULTURE WITHIN THE LAST 5 DAYS. Performed at [facility], [address] [place], [place] [address]    Report Status 08/31/2019 FINAL  Final  Culture, blood (routine x 2)     Status: Abnormal   Collection Time: 08/28/19  4:52 PM   Specimen: BLOOD LEFT HAND  Result Value Ref Range Status   Specimen Description BLOOD LEFT HAND  Final   Special Requests AEROBIC BOTTLE ONLY Blood Culture adequate volume  Final   Culture  Setup Time   Final    GRAM POSITIVE COCCI AEROBIC BOTTLE ONLY CRITICAL VALUE NOTED.  VALUE IS CONSISTENT WITH PREVIOUSLY REPORTED AND CALLED VALUE.    Culture (A)  Final    STAPHYLOCOCCUS AUREUS SUSCEPTIBILITIES PERFORMED ON PREVIOUS CULTURE WITHIN THE LAST 5 DAYS. Performed at [facility], [address], [place], [place] [address]    Report Status 08/31/2019 FINAL  Final  Blood Culture ID Panel (Reflexed)     Status: Abnormal   Collection Time: 08/28/19  4:52 PM  Result Value Ref Range Status   Enterococcus species NOT DETECTED NOT DETECTED Final   Listeria monocytogenes NOT DETECTED NOT DETECTED Final   Staphylococcus species DETECTED (A) NOT DETECTED Final    Comment: CRITICAL RESULT CALLED TO, READ BACK BY AND VERIFIED WITH: C PIERCE PHARMD 08/30/19 0514 JDW    Staphylococcus aureus (BCID) DETECTED (A) NOT DETECTED Final    Comment: Methicillin (oxacillin)-resistant Staphylococcus aureus (MRSA). MRSA is predictably resistant to beta-lactam antibiotics (except ceftaroline). Preferred therapy is vancomycin unless clinically contraindicated. Patient requires contact precautions if  hospitalized. CRITICAL RESULT CALLED TO, READ BACK BY AND VERIFIED WITH: C PIERCE PHARMD 08/30/19 0514 JDW    Methicillin resistance DETECTED (A) NOT DETECTED Final    Comment: CRITICAL RESULT CALLED TO, READ BACK BY AND VERIFIED WITH: C PIERCE PHARMD 08/30/19 0514 JDW    Streptococcus species NOT DETECTED NOT DETECTED Final   Streptococcus agalactiae NOT DETECTED NOT DETECTED Final   Streptococcus pneumoniae NOT DETECTED  NOT DETECTED Final   Streptococcus pyogenes NOT DETECTED NOT DETECTED Final   Acinetobacter baumannii NOT DETECTED NOT DETECTED Final   Enterobacteriaceae species NOT DETECTED NOT DETECTED Final   Enterobacter cloacae complex NOT DETECTED NOT DETECTED Final   Escherichia coli NOT DETECTED NOT DETECTED Final   Klebsiella oxytoca NOT DETECTED NOT DETECTED Final   Klebsiella pneumoniae NOT DETECTED NOT DETECTED Final   Proteus species NOT DETECTED NOT DETECTED Final   Serratia marcescens NOT DETECTED NOT DETECTED Final   Haemophilus influenzae  NOT DETECTED NOT DETECTED Final   Neisseria meningitidis NOT DETECTED NOT DETECTED Final   Pseudomonas aeruginosa NOT DETECTED NOT DETECTED Final   Candida albicans NOT DETECTED NOT DETECTED Final   Candida glabrata NOT DETECTED NOT DETECTED Final   Candida krusei NOT DETECTED NOT DETECTED Final   Candida parapsilosis NOT DETECTED NOT DETECTED Final   Candida tropicalis NOT DETECTED NOT DETECTED Final    Comment: Performed at [facility], [address] [place], [place] [address]  Culture, blood (routine x 2)     Status: Abnormal   Collection Time: 08/28/19  8:34 PM   Specimen: BLOOD LEFT HAND  Result Value Ref Range Status   Specimen Description BLOOD LEFT HAND  Final   Special Requests   Final    BOTTLES DRAWN AEROBIC ONLY Blood Culture results may not be optimal due to an inadequate volume of blood received in culture bottles   Culture  Setup Time   Final    AEROBIC BOTTLE ONLY GRAM POSITIVE COCCI CRITICAL VALUE NOTED.  VALUE IS CONSISTENT WITH PREVIOUSLY REPORTED AND CALLED VALUE.    Culture (A)  Final    STAPHYLOCOCCUS AUREUS SUSCEPTIBILITIES PERFORMED ON PREVIOUS CULTURE WITHIN THE LAST 5 DAYS. Performed at [facility], [address] [place], [place] [address]    Report Status 08/31/2019 FINAL  Final  Culture, blood (routine x 2)     Status: Abnormal   Collection Time: 08/28/19  8:34 PM   Specimen: BLOOD RIGHT HAND    Result Value Ref Range Status   Specimen Description BLOOD RIGHT HAND  Final   Special Requests   Final    BOTTLES DRAWN AEROBIC ONLY Blood Culture results may not be optimal due to an inadequate volume of blood received in culture bottles   Culture  Setup Time   Final    GRAM POSITIVE COCCI IN CLUSTERS AEROBIC BOTTLE ONLY CRITICAL RESULT CALLED TO, READ BACK BY AND VERIFIED WITH: PHARMD CHRIS WALSTON [phone] FCP Performed at [facility], [address] [place], [place] [address]    Culture METHICILLIN RESISTANT STAPHYLOCOCCUS AUREUS (A)  Final   Report Status 08/31/2019 FINAL  Final   Organism ID, Bacteria METHICILLIN RESISTANT STAPHYLOCOCCUS AUREUS  Final      Susceptibility   Methicillin resistant staphylococcus aureus - MIC*    CIPROFLOXACIN >=8 RESISTANT Resistant     ERYTHROMYCIN >=8 RESISTANT Resistant     GENTAMICIN <=0.5 SENSITIVE Sensitive     OXACILLIN >=4 RESISTANT Resistant     TETRACYCLINE <=1 SENSITIVE Sensitive     VANCOMYCIN 1 SENSITIVE Sensitive     TRIMETH/SULFA <=10 SENSITIVE Sensitive     CLINDAMYCIN <=0.25 SENSITIVE Sensitive     RIFAMPIN <=0.5 SENSITIVE Sensitive     Inducible Clindamycin NEGATIVE Sensitive     * METHICILLIN RESISTANT STAPHYLOCOCCUS AUREUS  Culture, blood (routine x 2)     Status: None (Preliminary result)   Collection Time: 09/01/19  9:03 AM   Specimen: BLOOD  Result Value Ref Range Status   Specimen Description BLOOD RIGHT ANTECUBITAL  Final   Special Requests   Final    BOTTLES DRAWN AEROBIC AND ANAEROBIC Blood Culture adequate volume   Culture   Final    NO GROWTH 3 DAYS Performed at [facility], [address] [place], [place] [address]    Report Status PENDING  Incomplete  Culture, blood (routine x 2)     Status: None (Preliminary result)   Collection  Time: 09/01/19  9:08 AM   Specimen: BLOOD LEFT HAND  Result Value Ref Range Status   Specimen Description BLOOD LEFT HAND  Final   Special Requests AEROBIC  BOTTLE ONLY Blood Culture adequate volume  Final   Culture   Final    NO GROWTH 3 DAYS Performed at [facility], [address] [place], [place] [address]    Report Status PENDING  Incomplete         Radiology Studies: Dg Swallowing Func-speech Pathology  Result Date: 09/04/2019 Objective Swallowing Evaluation: Type of Study: Bedside Swallow Evaluation  Patient Details Name: Ryan Lane MRN: [id number] Date of Birth: Sep 05, 1951 Today's Date: 09/04/2019 Time: SLP Start Time (ACUTE ONLY): 0855 -SLP Stop Time (ACUTE ONLY): 0920 SLP Time Calculation (min) (ACUTE ONLY): 25 min Past Medical History: No past medical history on file. Past Surgical History: Past Surgical History: Procedure Laterality Date  BUBBLE STUDY  09/02/2019  Procedure: BUBBLE STUDY;  Surgeon: Chrystie Nose, MD;  Location: [facility] ENDOSCOPY;  Service: Cardiovascular;;  TEE WITHOUT CARDIOVERSION N/A 09/02/2019  Procedure: TRANSESOPHAGEAL ECHOCARDIOGRAM (TEE);  Surgeon: Chrystie Nose, MD;  Location: [facility] ENDOSCOPY;  Service: Cardiovascular;  Laterality: N/A; HPI: Pt is a 68 y.o. M with significant PMH of alcohol and polysubstance abuse who presents to ER with fever, chills, body aches and diarrhea. Admitted with pneumonia/bacteremia with MRSA, transferred to [facility] on 08/28/2019 for further treatment and possible TEE. CT chest/abd/pelvis showing left psoas intramuscular hematoma with evidence of small active bleed. 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  Subjective: pt awake in bed, head leaning up against tower with pillow due to his neck pain Assessment / Plan / Recommendation CHL IP CLINICAL IMPRESSIONS 09/03/2019 Clinical Impression Patient presents with moderate pharyngeal dysphagia with sensorimotor deficits.  Decreased oral propulsion with impaired tongue base retraction and mildly decreased laryngeal closure results in mild pharyngeal retention and aspiration of thin via tsp and straw.  Did not test  head turn and pt unable to perform chin tuck adequately due to discomfort.  Cued effortful and dry swallows effective to diminish resdiuals.  Given pt's residuals in phayrnx, do not recommend solids at tihs time.  Full liquid, nectar and tsps thin water between meals after oral care advised.  Using teach back, SLP educated pt to compensation strategies and recommendations. SLP Visit Diagnosis Dysphagia, oropharyngeal phase (R13.12) Attention and concentration deficit following -- Frontal lobe and executive function deficit following -- Impact on safety and function Moderate aspiration risk   CHL IP TREATMENT RECOMMENDATION 09/03/2019 Treatment Recommendations Therapy as outlined in treatment plan below   Prognosis 09/03/2019 Prognosis for Safe Diet Advancement Good Barriers to Reach Goals -- Barriers/Prognosis Comment -- CHL IP DIET RECOMMENDATION 09/03/2019 SLP Diet Recommendations Thin liquid;Nectar thick liquid Liquid Administration via Straw Medication Administration Whole meds with puree Compensations Slow rate;Small sips/bites Postural Changes Remain semi-upright after after feeds/meals (Comment);Seated upright at 90 degrees   CHL IP OTHER RECOMMENDATIONS 09/03/2019 Recommended Consults -- Oral Care Recommendations Oral care QID Other Recommendations --   CHL IP FOLLOW UP RECOMMENDATIONS 09/03/2019 Follow up Recommendations Skilled Nursing facility   [facility] IP FREQUENCY AND DURATION 09/03/2019 Speech Therapy Frequency (ACUTE ONLY) min 3x week Treatment Duration 2 weeks      CHL IP ORAL PHASE 09/03/2019 Oral Phase WFL Oral - Pudding Teaspoon -- Oral - Pudding Cup -- Oral - Honey Teaspoon -- Oral - Honey Cup -- Oral - Nectar Teaspoon -- Oral - Nectar Cup -- Oral -  Nectar Straw WFL Oral - Thin Teaspoon WFL Oral - Thin Cup NT Oral - Thin Straw WFL Oral - Puree WFL Oral - Mech Soft WFL Oral - Regular -- Oral - Multi-Consistency -- Oral - Pill -- Oral Phase - Comment --  CHL IP PHARYNGEAL PHASE 09/03/2019 Pharyngeal  Phase Impaired Pharyngeal- Pudding Teaspoon -- Pharyngeal -- Pharyngeal- Pudding Cup -- Pharyngeal -- Pharyngeal- Honey Teaspoon -- Pharyngeal -- Pharyngeal- Honey Cup -- Pharyngeal -- Pharyngeal- Nectar Teaspoon -- Pharyngeal -- Pharyngeal- Nectar Cup -- Pharyngeal -- Pharyngeal- Nectar Straw Pharyngeal residue - valleculae;Pharyngeal residue - pyriform Pharyngeal Material does not enter airway Pharyngeal- Thin Teaspoon Pharyngeal residue - valleculae;Penetration/Aspiration before swallow Pharyngeal Material enters airway, passes BELOW cords and not ejected out despite cough attempt by patient Pharyngeal- Thin Cup NT Pharyngeal -- Pharyngeal- Thin Straw Pharyngeal residue - valleculae;Penetration/Aspiration during swallow;Pharyngeal residue - pyriform Pharyngeal Material enters airway, passes BELOW cords and not ejected out despite cough attempt by patient Pharyngeal- Puree Pharyngeal residue - valleculae;Pharyngeal residue - pyriform Pharyngeal Material does not enter airway Pharyngeal- Mechanical Soft Pharyngeal residue - valleculae;Pharyngeal residue - pyriform Pharyngeal Material does not enter airway Pharyngeal- Regular -- Pharyngeal -- Pharyngeal- Multi-consistency -- Pharyngeal -- Pharyngeal- Pill -- Pharyngeal -- Pharyngeal Comment --  CHL IP CERVICAL ESOPHAGEAL PHASE 09/03/2019 Cervical Esophageal Phase Impaired Pudding Teaspoon -- Pudding Cup -- Honey Teaspoon -- Honey Cup -- Nectar Teaspoon -- Nectar Cup -- Nectar Straw -- Thin Teaspoon -- Thin Cup -- Thin Straw -- Puree -- Mechanical Soft -- Regular -- Multi-consistency -- Pill -- Cervical Esophageal Comment decreased UES openiing Chales Abrahams 09/04/2019, 8:29 AM  Donavan Burnet, MS [facility] SLP Acute Rehab Services Pager [phone] Office [phone]                  Scheduled Meds:  feeding supplement (OSMOLITE 1.5 CAL)  1,000 mL Per Tube Q24H   feeding supplement (PRO-STAT SUGAR FREE 64)  30 mL Per Tube BID   folic acid  1 mg Oral  Daily   Or   folic acid  1 mg Intravenous Daily   Gerhardt's butt cream   Topical Daily   LORazepam  1 mg Intravenous Once   multivitamin with minerals  1 tablet Oral Daily   thiamine  100 mg Oral Daily   Or   thiamine  100 mg Intravenous Daily   Continuous Infusions:  sodium chloride 20 mL/hr at 09/01/19 2109   dextrose 5 % and 0.45 % NaCl with KCl 10 mEq/L 125 mL/hr at 09/04/19 1012   vancomycin 1,000 mg (09/04/19 1534)     LOS: 7 days        Kathlen Mody, MD [facility] 09/04/2019, 4:31 PM

## 2019-09-04 NOTE — Progress Notes (Signed)
Pt stable during shift and rested comfortably during the night. Hand off report written and given to RN Baxter Flattery. Delia Heady RN

## 2019-09-04 NOTE — Progress Notes (Signed)
PHARMACY CONSULT NOTE FOR:  OUTPATIENT  PARENTERAL ANTIBIOTIC THERAPY (OPAT)  Indication: MRSA bacteremia/ concern for endocarditis Regimen: Vancomycin 1000 mg Q 12 hours End date: 10/13/2019  IV antibiotic discharge orders are pended. To discharging provider:  please sign these orders via discharge navigator,  Select New Orders & click on the button choice - Manage This Unsigned Work.     Thank you for allowing pharmacy to be a part of this patient's care.  Jimmy Footman, PharmD, BCPS, [place] Infectious Diseases Clinical Pharmacist Phone: [phone] 09/04/2019, 2:47 PM

## 2019-09-05 ENCOUNTER — Inpatient Hospital Stay: Payer: Self-pay

## 2019-09-05 ENCOUNTER — Inpatient Hospital Stay (HOSPITAL_COMMUNITY): Payer: Medicare Other

## 2019-09-05 LAB — COMPREHENSIVE METABOLIC PANEL
ALT: 33 U/L (ref 0–44)
AST: 36 U/L (ref 15–41)
Albumin: 1.3 g/dL — ABNORMAL LOW (ref 3.5–5.0)
Alkaline Phosphatase: 69 U/L (ref 38–126)
Anion gap: 9 (ref 5–15)
BUN: 6 mg/dL — ABNORMAL LOW (ref 8–23)
CO2: 28 mmol/L (ref 22–32)
Calcium: 7.9 mg/dL — ABNORMAL LOW (ref 8.9–10.3)
Chloride: 94 mmol/L — ABNORMAL LOW (ref 98–111)
Creatinine, Ser: 0.54 mg/dL — ABNORMAL LOW (ref 0.61–1.24)
GFR calc Af Amer: >60 mL/min (ref >60)
GFR calc non Af Amer: >60 mL/min (ref >60)
Glucose, Bld: 96 mg/dL (ref 70–99)
Potassium: 3.7 mmol/L (ref 3.5–5.1)
Sodium: 131 mmol/L — ABNORMAL LOW (ref 135–145)
Total Bilirubin: 1 mg/dL (ref 0.3–1.2)
Total Protein: 6.1 g/dL — ABNORMAL LOW (ref 6.5–8.1)

## 2019-09-05 LAB — CBC WITH DIFFERENTIAL/PLATELET
Abs Immature Granulocytes: 0.08 10*3/uL — ABNORMAL HIGH (ref 0.00–0.07)
Basophils Absolute: 0 10*3/uL (ref 0.0–0.1)
Basophils Relative: 0 %
Eosinophils Absolute: 0.1 10*3/uL (ref 0.0–0.5)
Eosinophils Relative: 1 %
HCT: 24.6 % — ABNORMAL LOW (ref 39.0–52.0)
Hemoglobin: 8.5 g/dL — ABNORMAL LOW (ref 13.0–17.0)
Immature Granulocytes: 1 %
Lymphocytes Relative: 11 %
Lymphs Abs: 1.3 10*3/uL (ref 0.7–4.0)
MCH: 32.1 pg (ref 26.0–34.0)
MCHC: 34.6 g/dL (ref 30.0–36.0)
MCV: 92.8 fL (ref 80.0–100.0)
Monocytes Absolute: 1.1 10*3/uL — ABNORMAL HIGH (ref 0.1–1.0)
Monocytes Relative: 10 %
Neutro Abs: 9.2 10*3/uL — ABNORMAL HIGH (ref 1.7–7.7)
Neutrophils Relative %: 77 %
Platelets: 565 10*3/uL — ABNORMAL HIGH (ref 150–400)
RBC: 2.65 MIL/uL — ABNORMAL LOW (ref 4.22–5.81)
RDW: 12.7 % (ref 11.5–15.5)
WBC: 11.8 10*3/uL — ABNORMAL HIGH (ref 4.0–10.5)
nRBC: 0 % (ref 0.0–0.2)

## 2019-09-05 LAB — GLUCOSE, CAPILLARY
Glucose-Capillary: 97 mg/dL (ref 70–99)
Glucose-Capillary: 90 mg/dL (ref 70–99)
Glucose-Capillary: 93 mg/dL (ref 70–99)
Glucose-Capillary: 74 mg/dL (ref 70–99)
Glucose-Capillary: 73 mg/dL (ref 70–99)
Glucose-Capillary: 93 mg/dL (ref 70–99)

## 2019-09-05 LAB — VANCOMYCIN, TROUGH: Vancomycin Tr: 10 ug/mL — ABNORMAL LOW (ref 15–20)

## 2019-09-05 IMAGING — DX DG CHEST 1V PORT
1 series · 1 of 1 positions shown · non-contrast
Comparison: Chest x-ray dated [DATE]

CLINICAL DATA: Hypoxia. Left pleural effusion. Cavitary lesions and
infiltrates in the lungs.

EXAM:
PORTABLE CHEST 1 VIEW

[chest ap]
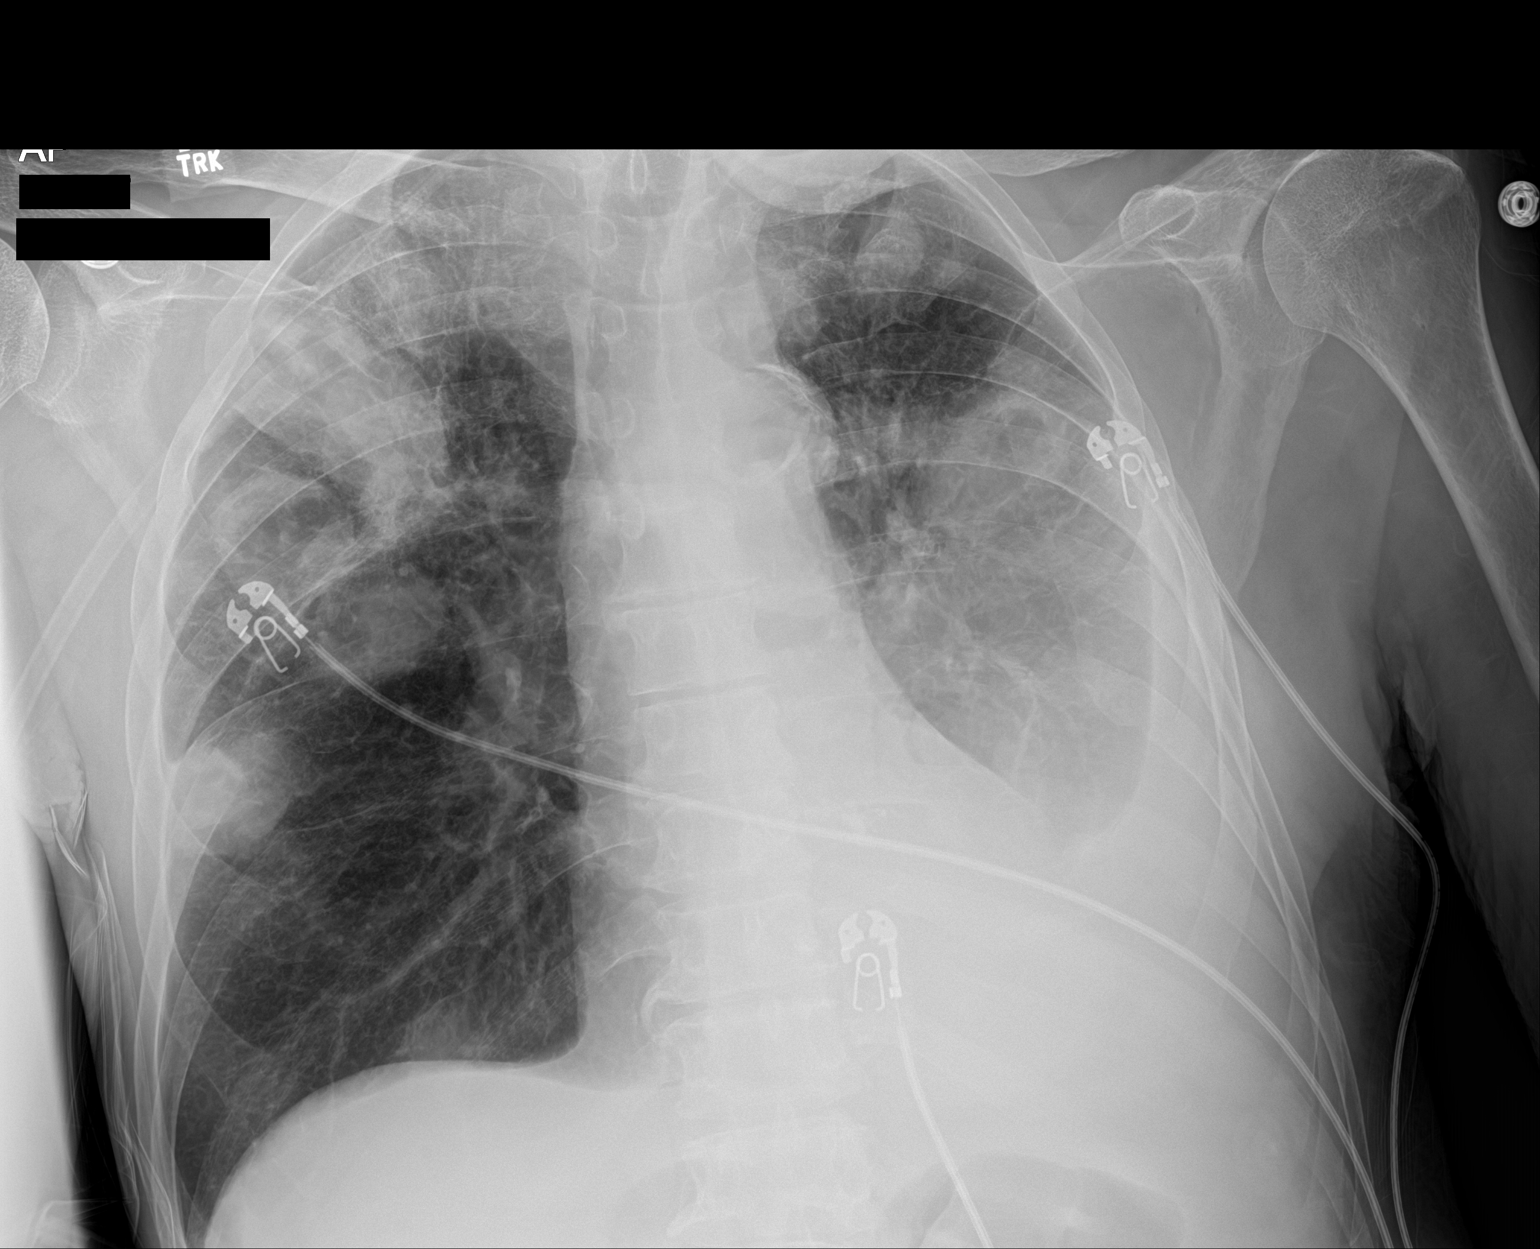

[1 of 1 positions shown; findings below may reference images not displayed]

FINDINGS: There has been a marked decrease in the large left pleural effusion.
A moderate left effusion remains. Multiple cavitary lesions are
again noted in both lungs. There is a new ill-defined area of
infiltrate at the right lung base. The infiltrate at the right lung
apex is slightly better defined.

Feeding tube has been removed. No acute bone abnormality.

Heart size and pulmonary vascularity are normal. Aortic
atherosclerosis.
IMPRESSION: 1. Marked decrease in the large left pleural effusion.
2. Persistent moderate left effusion.
3. New ill-defined infiltrate at the right lung base.
4. Multiple cavitary lesions in both lungs.
5. Aortic atherosclerosis.

## 2019-09-05 MED ORDER — SODIUM CHLORIDE 0.9% FLUSH
10.0000 mL | Freq: Two times a day (BID) | INTRAVENOUS | Status: DC
  Administered 2019-09-06: 10 mL

## 2019-09-05 MED ORDER — SODIUM CHLORIDE 0.9% FLUSH: 10.0000 mL | INTRAVENOUS | Status: DC | PRN

## 2019-09-05 MED ORDER — VANCOMYCIN HCL 10 G IV SOLR
1250.0000 mg | Freq: Two times a day (BID) | INTRAVENOUS | Status: DC
  Administered 2019-09-05 – 2019-09-06 (×3): 1250 mg via INTRAVENOUS
  Filled 2019-09-05 (×4): qty 1250

## 2019-09-05 MED ORDER — CHLORHEXIDINE GLUCONATE CLOTH 2 % EX PADS
6.0000 | MEDICATED_PAD | Freq: Every day | CUTANEOUS | Status: DC
  Administered 2019-09-05 – 2019-09-09 (×4): 6 via TOPICAL

## 2019-09-05 MED ORDER — NEPRO/CARBSTEADY PO LIQD
237.0000 mL | Freq: Two times a day (BID) | ORAL | Status: DC
  Administered 2019-09-06 – 2019-09-09 (×4): 237 mL via ORAL

## 2019-09-05 NOTE — Progress Notes (Signed)
PHARMACY CONSULT NOTE FOR:  OUTPATIENT  PARENTERAL ANTIBIOTIC THERAPY (OPAT)  Indication: MRSA bacteremia/ concern for endocarditis Regimen: Vancomycin 1250 mg Q 12 hours End date: 10/13/2019  IV antibiotic discharge orders are pended. To discharging provider:  please sign these orders via discharge navigator,  Select New Orders & click on the button choice - Manage This Unsigned Work.     Thank you for allowing pharmacy to be a part of this patient's care.  Jimmy Footman, PharmD, BCPS, BCIDP Infectious Diseases Clinical Pharmacist Phone: [phone] 09/05/2019, 9:56 AM

## 2019-09-05 NOTE — Progress Notes (Signed)
  Speech Language Pathology Treatment: Dysphagia  Patient Details Name: Ryan Lane MRN: [id number] DOB: 02-15-1951 Today's Date: 09/05/2019 Time: 5102-5852 SLP Time Calculation (min) (ACUTE ONLY): 24 min  Assessment / Plan / Recommendation Clinical Impression  Patient sleeping when entering room. Washed pt's face and repositioned him in bed in attempt to wake him fully. He kept saying "I'm trying to wake up". Oral care provided. Pt remained lethargic throughout session, waking to voice or touch. He tolerated trials of thin and nectar liquids well with clear vocal quality and no throat clear/cough. A delayed cough noted 2/3 trials of purees. Suspect he continues to have residue of purees in pharyngeal area. Due to lethargy, do not recommend an upgrade of PO at this time. ST to follow for upgraded diet and compensatory training.    HPI HPI: Pt is a 68 y.o. M with significant PMH of alcohol and polysubstance abuse who presents to ER with fever, chills, body aches and diarrhea. Admitted with pneumonia/bacteremia with MRSA, transferred to [facility] on 08/28/2019 for further treatment and possible TEE. CT chest/abd/pelvis showing left psoas intramuscular hematoma with evidence of small active bleed. 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       SLP Plan  Continue with current plan of care       Recommendations  Diet recommendations: Nectar-thick liquid Liquids provided via: Cup Medication Administration: Whole meds with puree Supervision: Trained caregiver to feed patient;Full supervision/cueing for compensatory strategies Compensations: Slow rate;Small sips/bites;Effortful swallow Postural Changes and/or Swallow Maneuvers: Seated upright 90 degrees;Upright 30-60 min after meal                Plan: Continue with current plan of care       [place], MA, CCC-SLP 09/05/2019, 11:29 AM

## 2019-09-05 NOTE — Progress Notes (Signed)
Palliative: [name] is lying quietly in bed.  He is sleeping, but wakes easily.  He is oriented to person place and time.  He is able to make his basic needs known.  There is no family at bedside at this time.  We talked about what is going on with him, stroke, bacteremia.  We talked about the treatment plan in detail including 6 weeks of IV antibiotics, rehab.  No questions or concerns at this time.  Call to son, Ryan Lane at [phone] in [place].  Left voicemail message.  Conference with bedside nursing related to patient condition, needs.  Plan:   Continue to treat the treatable but no extraordinary measures.  Agreeable to rehab and long-term antibiotic for bacteremia.  59 minutes Ryan Axe, NP Palliative Medicine Team Team Phone # [phone] Greater than 50% of this time was spent counseling and coordinating care related to the above assessment and plan.

## 2019-09-05 NOTE — Progress Notes (Signed)
Received order for PICC placement. 

## 2019-09-05 NOTE — Evaluation (Signed)
Physical Therapy Evaluation Patient Details Name: Ryan Lane MRN: [id number] DOB: 1951/09/04 Today's Date: 09/05/2019   History of Present Illness  68 y.o. M with significant PMH of alcohol and polysubstance abuse who presents to ER with fever, chills, body aches and diarrhea. Admitted with pneumonia/bacteremia with MRSA, transferred to [facility] on 08/28/2019 for further treatment and possible TEE. CT chest/abd/pelvis showing left psoas intramuscular hematoma with evidence of small active bleed. 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   Clinical Impression  Pt progressing slowly and steadily towards physical therapy goals. Received with urinary continence; able to stand from edge of bed with moderate assist to walker and sustain for several minutes in order to provide clean bed linens. HR 102-129 bpm; BP 148/75. Demonstrates abnormal posture and weak cervical extensors; repositioned in supine to promote neutral. Transfer to chair deferred today due to pending chest x-ray.     Follow Up Recommendations SNF    Equipment Recommendations  Wheelchair (measurements PT);3in1 (PT)    Recommendations for Other Services       Precautions / Restrictions Precautions Precautions: Fall;Other (comment) Precaution Comments: NG tube Restrictions Weight Bearing Restrictions: No      Mobility  Bed Mobility Overal bed mobility: Needs Assistance Bed Mobility: Supine to Sit;Sit to Supine Rolling: Mod assist   Supine to sit: Mod assist     General bed mobility comments: Pt reaching over for bed rail with cues, requiring modA for trunk elevation to upright  Transfers Overall transfer level: Needs assistance Equipment used: Rolling walker (2 wheeled) Transfers: Sit to/from Stand Sit to Stand: Mod assist         General transfer comment: Standing from edge of bed with modA, cues for upright posture. Increased cervical flexion and left lateral flexion  Ambulation/Gait                Stairs            Wheelchair Mobility    Modified Rankin (Stroke Patients Only) Modified Rankin (Stroke Patients Only) Pre-Morbid Rankin Score: No symptoms Modified Rankin: Severe disability     Balance Overall balance assessment: Needs assistance Sitting-balance support: Feet supported Sitting balance-Leahy Scale: Fair Sitting balance - Comments: supervision for safety, increased cervical flexion   Standing balance support: Bilateral upper extremity supported Standing balance-Leahy Scale: Poor Standing balance comment: modA in standing                             Pertinent Vitals/Pain Pain Assessment: Faces Faces Pain Scale: Hurts little more Pain Location: "everywhere." Pain Descriptors / Indicators: Grimacing Pain Intervention(s): Monitored during session    Home Living                        Prior Function                 Hand Dominance        Extremity/Trunk Assessment                Communication      Cognition Arousal/Alertness: Awake/alert Behavior During Therapy: Flat affect Overall Cognitive Status: Impaired/Different from baseline Area of Impairment: Attention;Memory;Following commands;Safety/judgement;Awareness;Problem solving                   Current Attention Level: Sustained Memory: Decreased short-term memory Following Commands: Follows one step commands consistently Safety/Judgement: Decreased awareness of safety;Decreased awareness of deficits Awareness: Emergent Problem Solving: Slow  processing;Decreased initiation;Requires verbal cues;Requires tactile cues;Difficulty sequencing General Comments: Pt drowsy but responds to one step commands with stimulation and increased time. Oriented to day of week.      General Comments      Exercises     Assessment/Plan    PT Assessment    PT Problem List         PT Treatment Interventions      PT Goals (Current goals can be  found in the Care Plan section)  Acute Rehab PT Goals Patient Stated Goal: none stated Potential to Achieve Goals: Fair    Frequency Min 3X/week   Barriers to discharge        Co-evaluation               AM-PAC PT "6 Clicks" Mobility  Outcome Measure Help needed turning from your back to your side while in a flat bed without using bedrails?: A Lot Help needed moving from lying on your back to sitting on the side of a flat bed without using bedrails?: A Lot Help needed moving to and from a bed to a chair (including a wheelchair)?: A Little Help needed standing up from a chair using your arms (e.g., wheelchair or bedside chair)?: A Lot Help needed to walk in hospital room?: A Lot Help needed climbing 3-5 steps with a railing? : Total 6 Click Score: 12    End of Session Equipment Utilized During Treatment: Oxygen Activity Tolerance: Patient limited by fatigue Patient left: with call bell/phone within reach;in bed;with bed alarm set Nurse Communication: Mobility status PT Visit Diagnosis: Other abnormalities of gait and mobility (R26.89)    Time: 7253-6644 PT Time Calculation (min) (ACUTE ONLY): 27 min   Charges:     PT Treatments $Therapeutic Activity: 23-37 mins        Ellamae Sia, PT, DPT Acute Rehabilitation Services Pager [phone] Office [phone]   Willy Eddy 09/05/2019, 1:20 PM

## 2019-09-05 NOTE — Progress Notes (Signed)
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  He was transferred from [facility] to [facility] on November 12 for further evaluation and management.  He underwent a TEE which was negative for acute endocarditis.  Patient had persistent bacteremia with repeat blood cultures from 08/28/2019.  Blood cultures from 09/01/19 are negative so far.  Infectious disease is on board. Recommended 6 weeks of IV vancomycin for presumed endocarditis with CNS emboli and PICC line placement in the next 24 hours if cultures remain negative . The last date of IV Vancomycin is 12/28.   Assessment & Plan:   Principal Problem:   MRSA bacteremia Active Problems:   Alcohol withdrawal (HCC)   Acute CVA (cerebrovascular accident) (HCC)   Hyponatremia   Dyspnea and respiratory abnormalities   Palliative care encounter   Encounter for hospice care discussion   Goals of care, counseling/discussion   Palliative care by specialist  Sepsis secondary to MRSA bacteremia and pneumonia Persistent MRSA bacteremia on repeat blood cultures and persistent fevers. Patient is currently on IV vancomycin , ID consulted and recommended a totalof 6 weeks of treatment for presumed endocarditis and CNS emboli. Further evaluation of the source of persistent bacteremia with an MRI of the cervical and thoracic spine ordered but unfortunately could not be done as patient could not lay flat for the study to be performed. PICC line ordered today.     MRSA Pneumonia:  Pt is on 3l it of [place] oxygen to keep sats greater than 90%.  Repeat CXR shows Marked decrease in the large left pleural effusion. Persistent moderate left effusion. New  ill-defined infiltrate at the right lung base. Multiple cavitary lesions in both lungs.    Acute toxic/metabolic encephalopathy in the setting of MRSA bacteremia and pneumonia, stroke.   Neurology on board recommended an MRI of the brain but could not be done as patient could not lay flat and tolerated.  An EEG did not show any epileptiform findings.  Another differential could be Wernicke's encephalopathy. Pt is currently awake and answering questions appropriately. He continues to have neck pain and stiffness , with slight torticollis to the left side.  No new complaints.   Cerebellar CVA MRI from [facility] showed punctate right cerebellar and right corona radiata. Neurology is on board and suggested no indication of LP as it would not change the management of MRSA bacteremia and CNS emboli.    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   Left psoas intramuscular hematoma Patient's hemoglobin continues to drop from 11.6-11 0.1-9 0.4-8.8 to 8.3- 8.5.  Aspirin and Lovenox injections are on hold due to intramuscular hematoma.  Hyponatremia Sodium 131 today. Pt asymptomatic. Continue to monitor. Stop the IV fluids.   Dysphagia Diet as per nutritionist  Hypokalemia Replaced  History of alcohol abuse No signs of withdrawal at this time.  Mildly elevated liver enzymes Continue to monitor.  Patient denies any nausea or vomiting at this time.   Anemia of chronic disease and anemia of blood loss from the hematoma:  Transfuse to keep hemoglobin greater than 7.    In view of poor functional status, clinical deterioration, persistent MRSA bacteremia and fevers palliative care consulted and is  on board with discussions with the family.  He is currently DNR and further recommendations to follow.   DVT prophylaxis: SCDs Code Status: DNR Family Communication: discussed with family over the phone.  Disposition Plan: pending palcement, not safe for  discharge home.    Consultants:   Infectious disease  Neurology  Procedures: CT of the abdomen pelvis on admission Antimicrobials:  IV vancomycin  Subjective: No chest pain or sob. No new complaints.   Objective: Vitals:   09/05/19 0451 09/05/19 0738 09/05/19 1125 09/05/19 1200  BP:  (!) 144/70 (!) 148/75   Pulse:  (!) 104 (!) 110   Resp:  (!) 22 (!) 30   Temp:  98.8 F (37.1 C)  99.7 F (37.6 C)  TempSrc:  Axillary  Axillary  SpO2:  97% 97%   Weight: 68.4 kg       Intake/Output Summary (Last 24 hours) at 09/05/2019 1406 Last data filed at 09/05/2019 0731 Gross per 24 hour  Intake 2476.19 ml  Output 900 ml  Net 1576.19 ml   Filed Weights   09/03/19 0401 09/04/19 0332 09/05/19 0451  Weight: 67.6 kg 67.5 kg 68.4 kg    Examination:  General exam: well developed gentleman, no distress on 3 lit of [place] oxygen.  Respiratory system: diminished at bases, but tachypnea, no wheezing heard.  Cardiovascular system:s1s2, tachycardic. Gastrointestinal system: abd is soft , NT ND BS+ Central nervous system: ALERT and oriented to place and person.  Extremities: Trace edema, no cyanosis.  Skin: no rashes. Psychiatry: mood is appropriate.     Data Reviewed: I have personally reviewed following labs and imaging studies  CBC: Recent Labs  Lab 09/01/19 0908 09/02/19 0334 09/03/19 0340 09/04/19 0359 09/05/19 0316  WBC 21.1* 16.9* 15.9* 14.5* 11.8*  NEUTROABS 18.1* 13.9* 13.1* 11.5* 9.2*  HGB 10.7* 9.4* 8.8* 8.3* 8.5*  HCT 32.0* 27.6* 26.0* 24.4* 24.6*  MCV 97.3 95.2 94.5 95.3 92.8  PLT 281 231 363 457* 565*   Basic Metabolic Panel: Recent Labs  Lab 08/31/19 0336 09/01/19 0908 09/02/19 0334 09/03/19 0340 09/05/19 1218  NA 147* 141 139 135 131*  K 3.0* 3.2* 3.7 3.5 3.7  CL 110 104 105 102 94*  CO2 27 28 26 27 28   GLUCOSE 110* 121* 106* 110* 96  BUN 9 8 10 8  6*  CREATININE 0.58* 0.50* 0.50* 0.47* 0.54*  CALCIUM 8.1* 8.0* 7.7* 7.7* 7.9*  MG  --  1.9  --   --    --    GFR: CrCl cannot be calculated (Unknown ideal weight.). Liver Function Tests: Recent Labs  Lab 09/01/19 0908 09/02/19 0334 09/03/19 0340 09/05/19 1218  AST 50* 100* 61* 36  ALT 38 61* 49* 33  ALKPHOS 75 81 64 69  BILITOT 0.8 0.7 0.5 1.0  PROT 5.7* 5.4* 5.3* 6.1*  ALBUMIN 1.5* 1.4* 1.3* 1.3*   No results for input(s): LIPASE, AMYLASE in the last 168 hours. No results for input(s): AMMONIA in the last 168 hours. Coagulation Profile: No results for input(s): INR, PROTIME in the last 168 hours. Cardiac Enzymes: No results for input(s): CKTOTAL, CKMB, CKMBINDEX, TROPONINI in the last 168 hours. BNP (last 3 results) No results for input(s): PROBNP in the last 8760 hours. HbA1C: No results for input(s): HGBA1C in the last 72 hours. CBG: Recent Labs  Lab 09/04/19 2019 09/04/19 2352 09/05/19 0354 09/05/19 0833 09/05/19 1228  GLUCAP 103* 81 97 90 93   Lipid Profile: No results for input(s): CHOL, HDL, LDLCALC, TRIG,  CHOLHDL, LDLDIRECT in the last 72 hours. Thyroid Function Tests: No results for input(s): TSH, T4TOTAL, FREET4, T3FREE, THYROIDAB in the last 72 hours. Anemia Panel: No results for input(s): VITAMINB12, FOLATE, FERRITIN, TIBC, IRON, RETICCTPCT in the last 72 hours. Sepsis Labs: No results for input(s): PROCALCITON, LATICACIDVEN in the last 168 hours.  Recent Results (from the past 240 hour(s))  Culture, Urine     Status: None   Collection Time: 08/28/19  1:07 AM   Specimen: Urine, Catheterized  Result Value Ref Range Status   Specimen Description URINE, CATHETERIZED  Final   Special Requests Normal  Final   Culture   Final    NO GROWTH Performed at [facility], [address] [place], [place] [address]    Report Status 08/31/2019 FINAL  Final  Culture, blood (routine x 2)     Status: Abnormal   Collection Time: 08/28/19  4:52 PM   Specimen: BLOOD LEFT HAND  Result Value Ref Range Status   Specimen Description BLOOD LEFT HAND  Final    Special Requests AEROBIC BOTTLE ONLY Blood Culture adequate volume  Final   Culture  Setup Time   Final    AEROBIC BOTTLE ONLY GRAM POSITIVE COCCI CRITICAL RESULT CALLED TO, READ BACK BY AND VERIFIED WITH: C PIERCE PHARMD 08/30/19 0514 JDW    Culture (A)  Final    STAPHYLOCOCCUS AUREUS SUSCEPTIBILITIES PERFORMED ON PREVIOUS CULTURE WITHIN THE LAST 5 DAYS. Performed at [facility], [place] [address] [place], [place] [address]    Report Status 08/31/2019 FINAL  Final  Culture, blood (routine x 2)     Status: Abnormal   Collection Time: 08/28/19  4:52 PM   Specimen: BLOOD LEFT HAND  Result Value Ref Range Status   Specimen Description BLOOD LEFT HAND  Final   Special Requests AEROBIC BOTTLE ONLY Blood Culture adequate volume  Final   Culture  Setup Time   Final    GRAM POSITIVE COCCI AEROBIC BOTTLE ONLY CRITICAL VALUE NOTED.  VALUE IS CONSISTENT WITH PREVIOUSLY REPORTED AND CALLED VALUE.    Culture (A)  Final    STAPHYLOCOCCUS AUREUS SUSCEPTIBILITIES PERFORMED ON PREVIOUS CULTURE WITHIN THE LAST 5 DAYS. Performed at [facility], [place] [address], [place], [place] [address]    Report Status 08/31/2019 FINAL  Final  Blood Culture ID Panel (Reflexed)     Status: Abnormal   Collection Time: 08/28/19  4:52 PM  Result Value Ref Range Status   Enterococcus species NOT DETECTED NOT DETECTED Final   Listeria monocytogenes NOT DETECTED NOT DETECTED Final   Staphylococcus species DETECTED (A) NOT DETECTED Final    Comment: CRITICAL RESULT CALLED TO, READ BACK BY AND VERIFIED WITH: C PIERCE PHARMD 08/30/19 0514 JDW    Staphylococcus aureus (BCID) DETECTED (A) NOT DETECTED Final    Comment: Methicillin (oxacillin)-resistant Staphylococcus aureus (MRSA). MRSA is predictably resistant to beta-lactam antibiotics (except ceftaroline). Preferred therapy is vancomycin unless clinically contraindicated. Patient requires contact precautions if  hospitalized. CRITICAL RESULT CALLED TO, READ  BACK BY AND VERIFIED WITH: C PIERCE PHARMD 08/30/19 0514 JDW    Methicillin resistance DETECTED (A) NOT DETECTED Final    Comment: CRITICAL RESULT CALLED TO, READ BACK BY AND VERIFIED WITH: C PIERCE PHARMD 08/30/19 0514 JDW    Streptococcus species NOT DETECTED NOT DETECTED Final   Streptococcus agalactiae NOT DETECTED NOT DETECTED Final   Streptococcus pneumoniae NOT DETECTED NOT DETECTED Final   Streptococcus pyogenes NOT DETECTED NOT DETECTED Final  Acinetobacter baumannii NOT DETECTED NOT DETECTED Final   Enterobacteriaceae species NOT DETECTED NOT DETECTED Final   Enterobacter cloacae complex NOT DETECTED NOT DETECTED Final   Escherichia coli NOT DETECTED NOT DETECTED Final   Klebsiella oxytoca NOT DETECTED NOT DETECTED Final   Klebsiella pneumoniae NOT DETECTED NOT DETECTED Final   Proteus species NOT DETECTED NOT DETECTED Final   Serratia marcescens NOT DETECTED NOT DETECTED Final   Haemophilus influenzae NOT DETECTED NOT DETECTED Final   Neisseria meningitidis NOT DETECTED NOT DETECTED Final   Pseudomonas aeruginosa NOT DETECTED NOT DETECTED Final   Candida albicans NOT DETECTED NOT DETECTED Final   Candida glabrata NOT DETECTED NOT DETECTED Final   Candida krusei NOT DETECTED NOT DETECTED Final   Candida parapsilosis NOT DETECTED NOT DETECTED Final   Candida tropicalis NOT DETECTED NOT DETECTED Final    Comment: Performed at [facility], [address], [place], [place] [address]  Culture, blood (routine x 2)     Status: Abnormal   Collection Time: 08/28/19  8:34 PM   Specimen: BLOOD LEFT HAND  Result Value Ref Range Status   Specimen Description BLOOD LEFT HAND  Final   Special Requests   Final    BOTTLES DRAWN AEROBIC ONLY Blood Culture results may not be optimal due to an inadequate volume of blood received in culture bottles   Culture  Setup Time   Final    AEROBIC BOTTLE ONLY GRAM POSITIVE COCCI CRITICAL VALUE NOTED.  VALUE IS CONSISTENT WITH PREVIOUSLY  REPORTED AND CALLED VALUE.    Culture (A)  Final    STAPHYLOCOCCUS AUREUS SUSCEPTIBILITIES PERFORMED ON PREVIOUS CULTURE WITHIN THE LAST 5 DAYS. Performed at [facility], [address], [place], [place] [address]    Report Status 08/31/2019 FINAL  Final  Culture, blood (routine x 2)     Status: Abnormal   Collection Time: 08/28/19  8:34 PM   Specimen: BLOOD RIGHT HAND  Result Value Ref Range Status   Specimen Description BLOOD RIGHT HAND  Final   Special Requests   Final    BOTTLES DRAWN AEROBIC ONLY Blood Culture results may not be optimal due to an inadequate volume of blood received in culture bottles   Culture  Setup Time   Final    GRAM POSITIVE COCCI IN CLUSTERS AEROBIC BOTTLE ONLY CRITICAL RESULT CALLED TO, READ BACK BY AND VERIFIED WITH: PHARMD CHRIS WALSTON [id number] FCP Performed at [facility], [address] [place], [place] [address]    Culture METHICILLIN RESISTANT STAPHYLOCOCCUS AUREUS (A)  Final   Report Status 08/31/2019 FINAL  Final   Organism ID, Bacteria METHICILLIN RESISTANT STAPHYLOCOCCUS AUREUS  Final      Susceptibility   Methicillin resistant staphylococcus aureus - MIC*    CIPROFLOXACIN >=8 RESISTANT Resistant     ERYTHROMYCIN >=8 RESISTANT Resistant     GENTAMICIN <=0.5 SENSITIVE Sensitive     OXACILLIN >=4 RESISTANT Resistant     TETRACYCLINE <=1 SENSITIVE Sensitive     VANCOMYCIN 1 SENSITIVE Sensitive     TRIMETH/SULFA <=10 SENSITIVE Sensitive     CLINDAMYCIN <=0.25 SENSITIVE Sensitive     RIFAMPIN <=0.5 SENSITIVE Sensitive     Inducible Clindamycin NEGATIVE Sensitive     * METHICILLIN RESISTANT STAPHYLOCOCCUS AUREUS  Culture, blood (routine x 2)     Status: None (Preliminary result)   Collection Time: 09/01/19  9:03 AM   Specimen: BLOOD  Result Value Ref Range Status   Specimen Description BLOOD  RIGHT ANTECUBITAL  Final   Special Requests   Final    BOTTLES DRAWN AEROBIC AND ANAEROBIC Blood Culture adequate volume   Culture    Final    NO GROWTH 4 DAYS Performed at [facility], [address], [place], [place] [address]    Report Status PENDING  Incomplete  Culture, blood (routine x 2)     Status: None (Preliminary result)   Collection Time: 09/01/19  9:08 AM   Specimen: BLOOD LEFT HAND  Result Value Ref Range Status   Specimen Description BLOOD LEFT HAND  Final   Special Requests AEROBIC BOTTLE ONLY Blood Culture adequate volume  Final   Culture   Final    NO GROWTH 4 DAYS Performed at [facility], [address] [place], [place] [address]    Report Status PENDING  Incomplete         Radiology Studies: Dg Chest Port 1 View  Result Date: 09/05/2019 CLINICAL DATA:  Hypoxia. Left pleural effusion. Cavitary lesions and infiltrates in the lungs. EXAM: PORTABLE CHEST 1 VIEW COMPARISON:  Chest x-ray dated 08/31/2019 FINDINGS: There has been a marked decrease in the large left pleural effusion. A moderate left effusion remains. Multiple cavitary lesions are again noted in both lungs. There is a new ill-defined area of infiltrate at the right lung base. The infiltrate at the right lung apex is slightly better defined. Feeding tube has been removed. No acute bone abnormality. Heart size and pulmonary vascularity are normal. Aortic atherosclerosis. IMPRESSION: 1. Marked decrease in the large left pleural effusion. 2. Persistent moderate left effusion. 3. New ill-defined infiltrate at the right lung base. 4. Multiple cavitary lesions in both lungs. 5. Aortic atherosclerosis. Electronically Signed   By: Francene Boyers M.D.   On: 09/05/2019 13:11   [place] Ekg Site Rite  Result Date: 09/05/2019 If Site Rite image not attached, placement could not be confirmed due to current cardiac rhythm.       Scheduled Meds: . feeding supplement (OSMOLITE 1.5 CAL)  1,000 mL Per Tube Q24H  . feeding supplement (PRO-STAT SUGAR FREE 64)  30 mL Per Tube BID  . folic acid  1 mg Oral Daily   Or  . folic acid  1 mg  Intravenous Daily  . Gerhardt's butt cream   Topical Daily  . LORazepam  1 mg Intravenous Once  . multivitamin with minerals  1 tablet Oral Daily  . thiamine  100 mg Oral Daily   Or  . thiamine  100 mg Intravenous Daily   Continuous Infusions: . sodium chloride 20 mL/hr at 09/01/19 2109  . vancomycin       LOS: 8 days        Kathlen Mody, MD [facility] 09/05/2019, 2:06 PM

## 2019-09-05 NOTE — Progress Notes (Signed)
Nutrition Follow-up  DOCUMENTATION CODES:   Not applicable  INTERVENTION:  -Nepro Shake po BID, each supplement provides 425 kcal and 19 grams protein -Continue MVI with minerals daily  NUTRITION DIAGNOSIS:   Inadequate oral intake related to inability to eat as evidenced by NPO status. Progressing  GOAL:   Patient will meet greater than or equal to 90% of their needs Progressing  MONITOR:   Diet advancement, Labs, Weight trends, TF tolerance  REASON FOR ASSESSMENT:   Other (Comment)    ASSESSMENT:  RD working remotely.  Ryan Lane is a 68 y.o. male with history of alcohol and tobacco abuse was brought to the ER at [facility] on August 23, 2019 that is about 5 days ago with fever chills body aches diarrhea decreased appetite.  In the ER he was found to have leukocytosis and bandemia with lymphopenia.  Patient's lab work showed hyponatremia of 115 lactic acidosis of 3 procalcitonin of 4.95 COVID-19 test was negative chest x-ray showed right middle lobe opacity concerning for pneumonia.  He was started on ceftriaxone and azithromycin for community-acquired pneumonia.  Subsequent which blood cultures grew MRSA.  Hospitalist at [place] contacted with infectious disease consultant at [facility] and antibiotics were changed to vancomycin and had 2D echo which did not show anything acute and an infectious disease consultant requested patient will need TEE.  The following day patient had a fall following which patient has become more confused and also had some garbled speech.  Plan was to get a CT head and MRI brain but since patient was not lying flat steadily it was initially unable to be done.  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  Patient was found to be mildly weak on the right side with garbled speech.  Getting more confused.  Since patient is also alcoholic was placed on CIWA protocol along with  thiamine.  Per records patient's EEG was unremarkable.  Patient was placed on fluids initially for hyponatremia which did not improve with fluids.  Records show that patient was planned to be started on tolvaptan.  Per the report patient's last sodium was 116 WBC count around 14,000 creatinine was normal.  Patient was transferred to [facility] for further management of MRSA bacteremia requiring TEE and stroke.   Pt admitted with MRSA bacteremia, acute CVA, and acute encephalopathy.   11/13- Cortrak; Initiated tube feedings 11/17-Cortrak removed by Endo team for TEE 11/18-MBSS: QJ:JHERDE Thick 11/20- PICC  Per chart review, discharge planning to SNF. Patient will need prolonged course of antimicrobial therapy with vancomycin through 10/13/19 for disseminated MSSA infection, PICC placement ordered today.  Patient documented with 100% of 1 recorded meal s/p full liquid diet advancement. Will provide Nepro BID to aid with calorie/protein needs.   I/Os: +1576 ml x 24 hrs    +10390 ml since admit UOP: 900 ml x 24 hrs Medications reviewed and include:Folic acid, MVI, Thiamine, Vancomycin  Labs: CBGS 74-93, Na 131 (L), Ca corrects for low albumin 10.1  Diet Order:   Diet Order            Diet full liquid Room service appropriate? Yes with Assist; Fluid consistency: Nectar Thick  Diet effective now              EDUCATION NEEDS:   No education needs have been identified at this time  Skin:  Skin Assessment: Reviewed RN Assessment  Last BM:  08/28/19  Height:  Ht Readings from Last 1 Encounters:  No data found for Ht    Weight:   Wt Readings from Last 1 Encounters:  09/05/19 68.4 kg    Ideal Body Weight:  (no ht available to assess at this time)  BMI:  There is no height or weight on file to calculate BMI.  Estimated Nutritional Needs:   Kcal:  2150-2350  Protein:  110-125 grams  Fluid:  > 2.2 L   Lajuan Lines, RD, LDN Clinical Nutrition [place]  Telephone [phone] After Hours/Weekend Pager: [phone]

## 2019-09-05 NOTE — Progress Notes (Signed)
Peripherally Inserted Central Catheter/Midline Placement  The IV Nurse has discussed with the patient and/or persons authorized to consent for the patient, the purpose of this procedure and the potential benefits and risks involved with this procedure.  The benefits include less needle sticks, lab draws from the catheter, and the patient may be discharged home with the catheter. Risks include, but not limited to, infection, bleeding, blood clot (thrombus formation), and puncture of an artery; nerve damage and irregular heartbeat and possibility to perform a PICC exchange if needed/ordered by physician.  Alternatives to this procedure were also discussed.  Bard Power PICC patient education guide, fact sheet on infection prevention and patient information card has been provided to patient /or left at bedside.    PICC/Midline Placement Documentation  PICC Single Lumen 09/05/19 PICC Right Brachial 38 cm 0 cm (Active)  Indication for Insertion or Continuance of Line Home intravenous therapies (PICC only) 09/05/19 1945  Exposed Catheter (cm) 0 cm 09/05/19 1945  Site Assessment Clean;Dry;Intact 09/05/19 1945  Line Status Flushed;Saline locked;Blood return noted 09/05/19 1945  Dressing Type Transparent 09/05/19 1945  Dressing Status Clean;Dry;Antimicrobial disc in place;Intact 09/05/19 1945  Dressing Change Due 09/12/19 09/05/19 1945       Gordan Payment 09/05/2019, 7:46 PM

## 2019-09-05 NOTE — Progress Notes (Signed)
Pharmacy Antibiotic Note  Ryan Lane is a 68 y.o. male admitted on 08/28/2019 with MRSA bacteremia.  Pharmacy has been consulted for Vancomycin dosing.   Levels drawn tonight and levels are subtherapeutic  Plan: Change Vancomycin 1250 mg IV q12h  Weight: 148 lb 13 oz (67.5 kg)  Temp (24hrs), Avg:98.9 F (37.2 C), Min:98 F (36.7 C), Max:99.5 F (37.5 C)  Recent Labs  Lab 08/29/19 0724 08/30/19 1800  08/31/19 0336 09/01/19 0908 09/02/19 0334 09/03/19 0340 09/04/19 0359 09/04/19 1902 09/05/19 0316  WBC 31.9*  --    < > 25.6* 21.1* 16.9* 15.9* 14.5*  --  11.8*  CREATININE 0.81  --   --  0.58* 0.50* 0.50* 0.47*  --   --   --   VANCOTROUGH  --   --   --  13*  --   --   --   --   --  10*  VANCOPEAK  --  27*  --   --   --   --   --   --  18*  --    < > = values in this interval not displayed.    CrCl cannot be calculated (Unknown ideal weight.).    No Known Allergies  Antimicrobials this admission: Vanc 11/12 >>  Dose adjustments: 11/15: VP=27, VT=13; AUC 464.1 - cont 1000 q12h 11/20: AUC 359--Inc Vancomycin 1250 IV q12h  Microbiology: 11/12 bcx - MRSA 11/12 UCx - ngF 11/16 BCx >> ngtd  Phillis Knack, PharmD, BCPS  09/05/2019 4:33 AM

## 2019-09-05 NOTE — Plan of Care (Signed)
Patient stable, discussed POC with patient, agreeable with plan, denies question/concerns at this time.  

## 2019-09-05 NOTE — Progress Notes (Signed)
Okay to place PICC per Toniann Ket FNP

## 2019-09-06 LAB — CBC WITH DIFFERENTIAL/PLATELET
Abs Immature Granulocytes: 0.08 10*3/uL — ABNORMAL HIGH (ref 0.00–0.07)
Basophils Absolute: 0.1 10*3/uL (ref 0.0–0.1)
Basophils Relative: 1 %
Eosinophils Absolute: 0.1 10*3/uL (ref 0.0–0.5)
Eosinophils Relative: 1 %
HCT: 25.3 % — ABNORMAL LOW (ref 39.0–52.0)
Hemoglobin: 8.6 g/dL — ABNORMAL LOW (ref 13.0–17.0)
Immature Granulocytes: 1 %
Lymphocytes Relative: 12 %
Lymphs Abs: 1.4 10*3/uL (ref 0.7–4.0)
MCH: 31.7 pg (ref 26.0–34.0)
MCHC: 34 g/dL (ref 30.0–36.0)
MCV: 93.4 fL (ref 80.0–100.0)
Monocytes Absolute: 1.2 10*3/uL — ABNORMAL HIGH (ref 0.1–1.0)
Monocytes Relative: 10 %
Neutro Abs: 8.7 10*3/uL — ABNORMAL HIGH (ref 1.7–7.7)
Neutrophils Relative %: 75 %
Platelets: 630 10*3/uL — ABNORMAL HIGH (ref 150–400)
RBC: 2.71 MIL/uL — ABNORMAL LOW (ref 4.22–5.81)
RDW: 12.5 % (ref 11.5–15.5)
WBC: 11.4 10*3/uL — ABNORMAL HIGH (ref 4.0–10.5)
nRBC: 0 % (ref 0.0–0.2)

## 2019-09-06 LAB — CULTURE, BLOOD (ROUTINE X 2)
Culture: NO GROWTH
Culture: NO GROWTH
Special Requests: ADEQUATE
Special Requests: ADEQUATE

## 2019-09-06 LAB — GLUCOSE, CAPILLARY
Glucose-Capillary: 102 mg/dL — ABNORMAL HIGH (ref 70–99)
Glucose-Capillary: 130 mg/dL — ABNORMAL HIGH (ref 70–99)
Glucose-Capillary: 152 mg/dL — ABNORMAL HIGH (ref 70–99)
Glucose-Capillary: 78 mg/dL (ref 70–99)
Glucose-Capillary: 82 mg/dL (ref 70–99)
Glucose-Capillary: 89 mg/dL (ref 70–99)

## 2019-09-06 LAB — MRSA PCR SCREENING: MRSA by PCR: POSITIVE — AB

## 2019-09-06 MED ORDER — VANCOMYCIN HCL 10 G IV SOLR
1250.0000 mg | Freq: Two times a day (BID) | INTRAVENOUS | Status: DC
  Administered 2019-09-06 – 2019-09-09 (×6): 1250 mg via INTRAVENOUS
  Filled 2019-09-06 (×7): qty 1250

## 2019-09-06 MED ORDER — BACLOFEN 10 MG PO TABS
5.0000 mg | ORAL_TABLET | Freq: Two times a day (BID) | ORAL | Status: DC
  Administered 2019-09-06 – 2019-09-09 (×4): 5 mg via ORAL
  Filled 2019-09-06 (×4): qty 1

## 2019-09-06 NOTE — Progress Notes (Signed)
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  He was transferred from [facility] to [facility] on November 12 for further evaluation and management.  He underwent a TEE which was negative for acute endocarditis.  Patient had persistent bacteremia with repeat blood cultures from 08/28/2019.  Blood cultures from 09/01/19 are negative so far.  Infectious disease is on board. Recommended 6 weeks of IV vancomycin for presumed endocarditis with CNS emboli and PICC line placement in the next 24 hours if cultures remain negative . The last date of IV Vancomycin is 12/28.  Discussed with the son and the patient regarding MRI under conscious sedation, they have agreed to get it done.   Assessment & Plan:   Principal Problem:   MRSA bacteremia Active Problems:   Alcohol withdrawal ([place])   Acute CVA (cerebrovascular accident) ([place])   Hyponatremia   Dyspnea and respiratory abnormalities   Palliative care encounter   Encounter for hospice care discussion   Goals of care, counseling/discussion   Palliative care by specialist  Sepsis secondary to MRSA bacteremia and pneumonia Persistent MRSA bacteremia on repeat blood cultures and persistent fevers. Patient is currently on IV vancomycin , ID consulted and recommended a totalof 6 weeks of treatment for presumed endocarditis and CNS emboli. Further evaluation of the source of persistent bacteremia with an MRI of the cervical and thoracic spine ordered but unfortunately could not be done as patient could not lay flat for the study to be performed. PICC line placed.     MRSA Pneumonia:  Pt is on 2l it of [place] oxygen to keep sats greater than 90%.   Repeat CXR shows Marked decrease in the large left pleural effusion. Persistent moderate left effusion. New ill-defined infiltrate at the right lung base. Multiple cavitary lesions in both lungs. Plan to wean him off the oxygen in the next 24 hours.     Acute toxic/metabolic encephalopathy in the setting of MRSA bacteremia and pneumonia, stroke.   Neurology on board recommended an MRI of the brain but could not be done as patient could not lay flat and tolerated.  An EEG did not show any epileptiform findings.  Another differential could be Wernicke's encephalopathy. Pt is currently awake and answering questions appropriately. He continues to have neck pain and stiffness , with slight torticollis to the left side.  No new complaints.   ? Cervical dystonia: pain in the neck associated with stiffness.  - baclofen ordered.   Cerebellar CVA MRI from [facility] showed punctate right cerebellar and right corona radiata. Neurology is on board and suggested no indication of LP as it would not change the management of MRSA bacteremia and CNS emboli.     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   Left psoas intramuscular hematoma Patient's hemoglobin continues to drop from 11.6-11 0.1-9 0.4-8.8 to 8.3- 8.5.  Aspirin and Lovenox injections are on hold due to intramuscular hematoma.  Hyponatremia Sodium 131 today. Pt asymptomatic. Continue to monitor. Stop the IV fluids. Repeat bmp  In am.   Dysphagia Diet as per nutritionist  Hypokalemia Replaced  History of alcohol abuse No signs of withdrawal at this time.  Mildly elevated liver enzymes Resolved. Marland Kitchen  Patient denies any nausea or vomiting at this time.   Anemia of chronic disease and anemia of blood loss from the hematoma:  Transfuse to keep hemoglobin greater than 7.      In view of poor functional status, clinical deterioration, persistent MRSA bacteremia and fevers palliative care consulted  and is on board with discussions with the family.  He is currently DNR and further recommendations to follow.   DVT prophylaxis: SCDs Code Status: DNR Family Communication: discussed with family over the phone.  Disposition Plan: pending palcement, not safe for discharge home.    Consultants:   Infectious disease  Neurology  Procedures: CT of the abdomen pelvis on admission Antimicrobials:  IV vancomycin  Subjective: Neck pain and stiff ness present. No chest pain or sob. No nausea, or vomiting or abdominal pain.   Objective: Vitals:   09/05/19 1533 09/05/19 2045 09/06/19 0057 09/06/19 0813  BP: 140/72 (!) 149/78 (!) 145/84   Pulse: 92 (!) 101 (!) 102   Resp: Temp: 98.6 F (37 C) 99 F (37.2 C) 98.2 F (36.8 C) 99 F (37.2 C)  TempSrc: Oral Oral Oral Axillary  SpO2: 98% 100% 100%   Weight:        Intake/Output Summary (Last 24 hours) at 09/06/2019 1518 Last data filed at 09/06/2019 0437 Gross per 24 hour  Intake -  Output 3800 ml  Net -3800 ml   Filed Weights   09/03/19 0401 09/04/19 0332 09/05/19 0451  Weight: 67.6 kg 67.5 kg 68.4 kg    Examination:  General exam: not in any distress. On 2lit of [place] oxygen.  Respiratory system: diminished at bases, no wheezing or rhonchi.  Cardiovascular system: S1S2, tachycardia. Gastrointestinal system: abd is soft non tender non distended.  Central nervous system: alert and oriented.  Extremities: no cyanosis or clubbing.  Skin: no rashes.  Psychiatry: mood appropriate.      Data Reviewed: I have personally reviewed following labs and imaging studies  CBC: Recent Labs  Lab 09/02/19 0334 09/03/19 0340 09/04/19 0359 09/05/19 0316 09/06/19 0315  WBC 16.9* 15.9* 14.5* 11.8* 11.4*  NEUTROABS 13.9* 13.1* 11.5* 9.2* 8.7*  HGB 9.4* 8.8* 8.3* 8.5* 8.6*  HCT 27.6* 26.0* 24.4* 24.6* 25.3*  MCV 95.2 94.5 95.3 92.8 93.4  PLT 231 363 457* 565* 630*   Basic Metabolic Panel: Recent Labs  Lab 08/31/19  0336 09/01/19 0908 09/02/19 0334 09/03/19 0340 09/05/19 1218  NA 147* 141 139 135 131*  K 3.0* 3.2* 3.7 3.5 3.7  CL 110 104 105 102 94*  CO2 GLUCOSE 110* 121* 106* 110* 96  BUN 6*  CREATININE 0.58* 0.50* 0.50* 0.47* 0.54*  CALCIUM 8.1* 8.0* 7.7* 7.7* 7.9*  MG  --  1.9  --   --   --    GFR: CrCl cannot be calculated (Unknown ideal weight.). Liver Function Tests: Recent Labs  Lab 09/01/19 0908 09/02/19 0334 09/03/19 0340 09/05/19 1218  AST 50* 100* 61* 36  ALT 38 61* 49* 33  ALKPHOS 75 81 64 69  BILITOT 0.8 0.7 0.5 1.0  PROT 5.7* 5.4* 5.3* 6.1*  ALBUMIN 1.5* 1.4* 1.3* 1.3*   No results for input(s): LIPASE, AMYLASE in the last 168 hours. No results for input(s): AMMONIA in the last 168 hours. Coagulation Profile: No results for input(s): INR, PROTIME in the last 168 hours. Cardiac Enzymes: No results for input(s): CKTOTAL, CKMB, CKMBINDEX, TROPONINI in the  last 168 hours. BNP (last 3 results) No results for input(s): PROBNP in the last 8760 hours. HbA1C: No results for input(s): HGBA1C in the last 72 hours. CBG: Recent Labs  Lab 09/05/19 2102 09/05/19 2312 09/06/19 0424 09/06/19 0801 09/06/19 1206  GLUCAP 73 93 78 89 82   Lipid Profile: No results for input(s): CHOL, HDL, LDLCALC, TRIG, CHOLHDL, LDLDIRECT in the last 72 hours. Thyroid Function Tests: No results for input(s): TSH, T4TOTAL, FREET4, T3FREE, THYROIDAB in the last 72 hours. Anemia Panel: No results for input(s): VITAMINB12, FOLATE, FERRITIN, TIBC, IRON, RETICCTPCT in the last 72 hours. Sepsis Labs: No results for input(s): PROCALCITON, LATICACIDVEN in the last 168 hours.  Recent Results (from the past 240 hour(s))  Culture, Urine     Status: None   Collection Time: 08/28/19  1:07 AM   Specimen: Urine, Catheterized  Result Value Ref Range Status   Specimen Description URINE, CATHETERIZED  Final   Special Requests Normal  Final   Culture   Final    NO GROWTH  Performed at [facility], [address] [place], [place] [address]    Report Status 08/31/2019 FINAL  Final  Culture, blood (routine x 2)     Status: Abnormal   Collection Time: 08/28/19  4:52 PM   Specimen: BLOOD LEFT HAND  Result Value Ref Range Status   Specimen Description BLOOD LEFT HAND  Final   Special Requests AEROBIC BOTTLE ONLY Blood Culture adequate volume  Final   Culture  Setup Time   Final    AEROBIC BOTTLE ONLY GRAM POSITIVE COCCI CRITICAL RESULT CALLED TO, READ BACK BY AND VERIFIED WITH: C PIERCE PHARMD 08/30/19 0514 JDW    Culture (A)  Final    STAPHYLOCOCCUS AUREUS SUSCEPTIBILITIES PERFORMED ON PREVIOUS CULTURE WITHIN THE LAST 5 DAYS. Performed at [facility], [address] [place], [place] [address]    Report Status 08/31/2019 FINAL  Final  Culture, blood (routine x 2)     Status: Abnormal   Collection Time: 08/28/19  4:52 PM   Specimen: BLOOD LEFT HAND  Result Value Ref Range Status   Specimen Description BLOOD LEFT HAND  Final   Special Requests AEROBIC BOTTLE ONLY Blood Culture adequate volume  Final   Culture  Setup Time   Final    GRAM POSITIVE COCCI AEROBIC BOTTLE ONLY CRITICAL VALUE NOTED.  VALUE IS CONSISTENT WITH PREVIOUSLY REPORTED AND CALLED VALUE.    Culture (A)  Final    STAPHYLOCOCCUS AUREUS SUSCEPTIBILITIES PERFORMED ON PREVIOUS CULTURE WITHIN THE LAST 5 DAYS. Performed at [facility], [address] [place], [place] [address]    Report Status 08/31/2019 FINAL  Final  Blood Culture ID Panel (Reflexed)     Status: Abnormal   Collection Time: 08/28/19  4:52 PM  Result Value Ref Range Status   Enterococcus species NOT DETECTED NOT DETECTED Final   Listeria monocytogenes NOT DETECTED NOT DETECTED Final   Staphylococcus species DETECTED (A) NOT DETECTED Final    Comment: CRITICAL RESULT CALLED TO, READ BACK BY AND VERIFIED WITH: C PIERCE PHARMD 08/30/19 0514 JDW    Staphylococcus aureus (BCID) DETECTED (A) NOT  DETECTED Final    Comment: Methicillin (oxacillin)-resistant Staphylococcus aureus (MRSA). MRSA is predictably resistant to beta-lactam antibiotics (except ceftaroline). Preferred therapy is vancomycin unless clinically contraindicated. Patient requires contact precautions if  hospitalized. CRITICAL RESULT CALLED TO, READ BACK BY AND VERIFIED WITH: C PIERCE PHARMD 08/30/19 0514 JDW    Methicillin resistance  DETECTED (A) NOT DETECTED Final    Comment: CRITICAL RESULT CALLED TO, READ BACK BY AND VERIFIED WITH: C PIERCE PHARMD 08/30/19 0514 JDW    Streptococcus species NOT DETECTED NOT DETECTED Final   Streptococcus agalactiae NOT DETECTED NOT DETECTED Final   Streptococcus pneumoniae NOT DETECTED NOT DETECTED Final   Streptococcus pyogenes NOT DETECTED NOT DETECTED Final   Acinetobacter baumannii NOT DETECTED NOT DETECTED Final   Enterobacteriaceae species NOT DETECTED NOT DETECTED Final   Enterobacter cloacae complex NOT DETECTED NOT DETECTED Final   Escherichia coli NOT DETECTED NOT DETECTED Final   Klebsiella oxytoca NOT DETECTED NOT DETECTED Final   Klebsiella pneumoniae NOT DETECTED NOT DETECTED Final   Proteus species NOT DETECTED NOT DETECTED Final   Serratia marcescens NOT DETECTED NOT DETECTED Final   Haemophilus influenzae NOT DETECTED NOT DETECTED Final   Neisseria meningitidis NOT DETECTED NOT DETECTED Final   Pseudomonas aeruginosa NOT DETECTED NOT DETECTED Final   Candida albicans NOT DETECTED NOT DETECTED Final   Candida glabrata NOT DETECTED NOT DETECTED Final   Candida krusei NOT DETECTED NOT DETECTED Final   Candida parapsilosis NOT DETECTED NOT DETECTED Final   Candida tropicalis NOT DETECTED NOT DETECTED Final    Comment: Performed at [facility], [address] [place], [place] [address]  Culture, blood (routine x 2)     Status: Abnormal   Collection Time: 08/28/19  8:34 PM   Specimen: BLOOD LEFT HAND  Result Value Ref Range Status   Specimen Description  BLOOD LEFT HAND  Final   Special Requests   Final    BOTTLES DRAWN AEROBIC ONLY Blood Culture results may not be optimal due to an inadequate volume of blood received in culture bottles   Culture  Setup Time   Final    AEROBIC BOTTLE ONLY GRAM POSITIVE COCCI CRITICAL VALUE NOTED.  VALUE IS CONSISTENT WITH PREVIOUSLY REPORTED AND CALLED VALUE.    Culture (A)  Final    STAPHYLOCOCCUS AUREUS SUSCEPTIBILITIES PERFORMED ON PREVIOUS CULTURE WITHIN THE LAST 5 DAYS. Performed at [facility], [address] [place], [place] [address]    Report Status 08/31/2019 FINAL  Final  Culture, blood (routine x 2)     Status: Abnormal   Collection Time: 08/28/19  8:34 PM   Specimen: BLOOD RIGHT HAND  Result Value Ref Range Status   Specimen Description BLOOD RIGHT HAND  Final   Special Requests   Final    BOTTLES DRAWN AEROBIC ONLY Blood Culture results may not be optimal due to an inadequate volume of blood received in culture bottles   Culture  Setup Time   Final    GRAM POSITIVE COCCI IN CLUSTERS AEROBIC BOTTLE ONLY CRITICAL RESULT CALLED TO, READ BACK BY AND VERIFIED WITH: PHARMD CHRIS WALSTON [phone] FCP Performed at [facility], [address] [place], [place] [address]    Culture METHICILLIN RESISTANT STAPHYLOCOCCUS AUREUS (A)  Final   Report Status 08/31/2019 FINAL  Final   Organism ID, Bacteria METHICILLIN RESISTANT STAPHYLOCOCCUS AUREUS  Final      Susceptibility   Methicillin resistant staphylococcus aureus - MIC*    CIPROFLOXACIN >=8 RESISTANT Resistant     ERYTHROMYCIN >=8 RESISTANT Resistant     GENTAMICIN <=0.5 SENSITIVE Sensitive     OXACILLIN >=4 RESISTANT Resistant     TETRACYCLINE <=1 SENSITIVE Sensitive     VANCOMYCIN 1 SENSITIVE Sensitive     TRIMETH/SULFA <=10 SENSITIVE Sensitive     CLINDAMYCIN <=0.25  SENSITIVE Sensitive     RIFAMPIN <=0.5 SENSITIVE Sensitive     Inducible Clindamycin NEGATIVE Sensitive     * METHICILLIN RESISTANT STAPHYLOCOCCUS AUREUS   Culture, blood (routine x 2)     Status: None   Collection Time: 09/01/19  9:03 AM   Specimen: BLOOD  Result Value Ref Range Status   Specimen Description BLOOD RIGHT ANTECUBITAL  Final   Special Requests   Final    BOTTLES DRAWN AEROBIC AND ANAEROBIC Blood Culture adequate volume   Culture   Final    NO GROWTH 5 DAYS Performed at [facility], [address], [place], [place] [address]    Report Status 09/06/2019 FINAL  Final  Culture, blood (routine x 2)     Status: None   Collection Time: 09/01/19  9:08 AM   Specimen: BLOOD LEFT HAND  Result Value Ref Range Status   Specimen Description BLOOD LEFT HAND  Final   Special Requests AEROBIC BOTTLE ONLY Blood Culture adequate volume  Final   Culture   Final    NO GROWTH 5 DAYS Performed at [facility], [address] [place], [place] [address]    Report Status 09/06/2019 FINAL  Final  MRSA PCR Screening     Status: Abnormal   Collection Time: 09/06/19  6:11 AM   Specimen: Nasopharyngeal  Result Value Ref Range Status   MRSA by PCR POSITIVE (A) NEGATIVE Final    Comment:        The GeneXpert MRSA Assay (FDA approved for NASAL specimens only), is one component of a comprehensive MRSA colonization surveillance program. It is not intended to diagnose MRSA infection nor to guide or monitor treatment for MRSA infections. RESULT CALLED TO, READ BACK BY AND VERIFIED WITH: RN Alan Ripper THOMPSON [phone] FCP Performed at [facility], [address] [place], [place] [address]          Radiology Studies: Dg Chest Port 1 View  Result Date: 09/05/2019 CLINICAL DATA:  Hypoxia. Left pleural effusion. Cavitary lesions and infiltrates in the lungs. EXAM: PORTABLE CHEST 1 VIEW COMPARISON:  Chest x-ray dated 08/31/2019 FINDINGS: There has been a marked decrease in the large left pleural effusion. A moderate left effusion remains. Multiple cavitary lesions are again noted in both lungs. There is a new ill-defined  area of infiltrate at the right lung base. The infiltrate at the right lung apex is slightly better defined. Feeding tube has been removed. No acute bone abnormality. Heart size and pulmonary vascularity are normal. Aortic atherosclerosis. IMPRESSION: 1. Marked decrease in the large left pleural effusion. 2. Persistent moderate left effusion. 3. New ill-defined infiltrate at the right lung base. 4. Multiple cavitary lesions in both lungs. 5. Aortic atherosclerosis. Electronically Signed   By: Francene Boyers M.D.   On: 09/05/2019 13:11   [place] Ekg Site Rite  Result Date: 09/05/2019 If Site Rite image not attached, placement could not be confirmed due to current cardiac rhythm.       Scheduled Meds: . baclofen  5 mg Oral BID  . Chlorhexidine Gluconate Cloth  6 each Topical Daily  . feeding supplement (NEPRO CARB STEADY)  237 mL Oral BID BM  . feeding supplement (OSMOLITE 1.5 CAL)  1,000 mL Per Tube Q24H  . feeding supplement (PRO-STAT SUGAR FREE 64)  30 mL Per Tube BID  . folic acid  1 mg Oral Daily   Or  . folic acid  1 mg Intravenous Daily  .  LORazepam  1 mg Intravenous Once  . multivitamin with minerals  1 tablet Oral Daily  . sodium chloride flush  10-40 mL Intracatheter Q12H  . thiamine  100 mg Oral Daily   Or  . thiamine  100 mg Intravenous Daily   Continuous Infusions: . sodium chloride 20 mL/hr at 09/01/19 2109  . vancomycin       LOS: 9 days        Kathlen Mody, MD [facility] 09/06/2019, 3:18 PM

## 2019-09-06 NOTE — TOC Progression Note (Signed)
Transition of Care [facility]) - Progression Note    Patient Details  Name: Ryan Lane MRN: [id number] Date of Birth: 04-Oct-1951  Transition of Care [facility]) CM/SW [place], [place] Phone Number: 09/06/2019, 2:59 PM  Clinical Narrative:     CSW called patient's son to provide bed offers. CSW left a voicemail, and is awaiting a return phone call.   CSW will continue to follow and assist as needed.   Expected Discharge Plan: [place] Barriers to Discharge: Continued Medical Work up  Expected Discharge Plan and Services Expected Discharge Plan: [place] In-house Referral: Clinical Social Work Discharge Planning Services: NA Post Acute Care Choice: [place] Living arrangements for the past 2 months: Single Family Home                 DME Arranged: N/A DME Agency: NA       HH Arranged: NA HH Agency: NA         Social Determinants of Health (SDOH) Interventions    Readmission Risk Interventions No flowsheet data found.

## 2019-09-07 LAB — GLUCOSE, CAPILLARY
Glucose-Capillary: 94 mg/dL (ref 70–99)
Glucose-Capillary: 91 mg/dL (ref 70–99)
Glucose-Capillary: 97 mg/dL (ref 70–99)
Glucose-Capillary: 93 mg/dL (ref 70–99)
Glucose-Capillary: 119 mg/dL — ABNORMAL HIGH (ref 70–99)
Glucose-Capillary: 94 mg/dL (ref 70–99)

## 2019-09-07 LAB — CBC WITH DIFFERENTIAL/PLATELET
Abs Immature Granulocytes: 0.09 10*3/uL — ABNORMAL HIGH (ref 0.00–0.07)
Basophils Absolute: 0.1 10*3/uL (ref 0.0–0.1)
Basophils Relative: 1 %
Eosinophils Absolute: 0.1 10*3/uL (ref 0.0–0.5)
Eosinophils Relative: 1 %
HCT: 25 % — ABNORMAL LOW (ref 39.0–52.0)
Hemoglobin: 8.4 g/dL — ABNORMAL LOW (ref 13.0–17.0)
Immature Granulocytes: 1 %
Lymphocytes Relative: 13 %
Lymphs Abs: 1.5 10*3/uL (ref 0.7–4.0)
MCH: 32.1 pg (ref 26.0–34.0)
MCHC: 33.6 g/dL (ref 30.0–36.0)
MCV: 95.4 fL (ref 80.0–100.0)
Monocytes Absolute: 1 10*3/uL (ref 0.1–1.0)
Monocytes Relative: 9 %
Neutro Abs: 8.3 10*3/uL — ABNORMAL HIGH (ref 1.7–7.7)
Neutrophils Relative %: 75 %
Platelets: 620 10*3/uL — ABNORMAL HIGH (ref 150–400)
RBC: 2.62 MIL/uL — ABNORMAL LOW (ref 4.22–5.81)
RDW: 12.7 % (ref 11.5–15.5)
WBC: 11.1 10*3/uL — ABNORMAL HIGH (ref 4.0–10.5)
nRBC: 0 % (ref 0.0–0.2)

## 2019-09-07 LAB — SARS CORONAVIRUS 2 (TAT 6-24 HRS): SARS Coronavirus 2: NEGATIVE

## 2019-09-07 MED ORDER — HYDRALAZINE HCL 25 MG PO TABS
25.0000 mg | ORAL_TABLET | Freq: Three times a day (TID) | ORAL | Status: DC
  Administered 2019-09-07 – 2019-09-09 (×3): 25 mg via ORAL
  Filled 2019-09-07 (×4): qty 1

## 2019-09-07 MED ORDER — ASPIRIN 81 MG PO CHEW
81.0000 mg | CHEWABLE_TABLET | Freq: Every day | ORAL | Status: DC
  Administered 2019-09-07 – 2019-09-09 (×2): 81 mg via ORAL
  Filled 2019-09-07 (×3): qty 1

## 2019-09-07 NOTE — Progress Notes (Signed)
PROGRESS NOTE    Ryan Lane  ZOX:[id number] DOB: 1951/07/31 DOA: 08/28/2019 PCP: Patient, No Pcp Per   Brief Narrative: 68 year old male with prior history of alcohol abuse, hyponatremia was initially brought to [facility] on 7 November and found to have MRSA bacteremia and acute stroke involving the right cerebellar and right corona radiata.  He was transferred from [facility] to [facility] on November 12 for further evaluation and management.  He underwent a TEE which was negative for acute endocarditis.  Patient had persistent bacteremia with repeat blood cultures from 08/28/2019.  Blood cultures from 09/01/19 are negative so far.  Infectious disease is on board. Recommended 6 weeks of IV vancomycin for presumed endocarditis with CNS emboli and PICC line placement in the next 24 hours if cultures remain negative . The last date of IV Vancomycin is 12/28.  Discussed with the son and the patient regarding MRI under conscious sedation, they have agreed to get it done.  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  Baclofen ordered with minimal improvement.  Soft neck collar placed.  MRI of the cervical spine and lumbar spine scheduled for tomorrow.   Assessment & Plan:   Principal Problem:   MRSA bacteremia Active Problems:   Alcohol withdrawal (HCC)   Acute CVA (cerebrovascular accident) (HCC)   Hyponatremia   Dyspnea and respiratory abnormalities   Palliative care encounter   Encounter for hospice care discussion   Goals of care, counseling/discussion   Palliative care by specialist  Sepsis secondary to MRSA bacteremia and pneumonia Persistent MRSA bacteremia on blood cultures from 08/28/2019 and negative blood cultures from 09/01/2019.  Patient is currently on IV vancomycin , ID consulted and recommended a totalof 6 weeks of treatment for presumed endocarditis and CNS emboli. Further evaluation of the source of persistent bacteremia with an MRI of the  cervical and thoracic spine ordered but unfortunately could not be done as patient could not lay flat for the study to be performed. It is ordered to be done under conscious sedation after discussing with the patient and his son over the phone.  PICC line placed and plan for 6 weeks of IV antibiotic therapy.     MRSA Pneumonia:  Pt is on 2l it of [place] oxygen to keep sats greater than 90%.  Repeat CXR shows Marked decrease in the large left pleural effusion. Persistent moderate left effusion. New ill-defined infiltrate at the right lung base. Multiple cavitary lesions in both lungs. Plan to wean him off the oxygen in the next 24 hours.     Acute toxic/metabolic encephalopathy in the setting of MRSA bacteremia and pneumonia, stroke.   Neurology on board recommended an MRI of the brain but could not be done as patient could not lay flat and tolerated.   An EEG did not show any epileptiform findings.  Another differential could be Wernicke's encephalopathy. Pt is currently awake and answering questions appropriately.  He continues to have neck pain and stiffness , with slight torticollis to the left side, probably ? Cervical dystonia.    ? Cervical dystonia: pain in the neck associated with stiffness.  - baclofen ordered with minimal improvement. Soft neck collar ordered.   Cerebellar CVA MRI from [facility] showed punctate right cerebellar and right corona radiata. Neurology is on board and suggested no indication of LP as it would not change the management of MRSA bacteremia and CNS emboli.     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  Left psoas intramuscular hematoma Patient's hemoglobin continues to drop from 11.6-11 0.1-9 0.4-8.8 to 8.3- 8.5- 8.4 and stable.  Aspirin restarted as the hematoma is stable.    Hyponatremia Sodium 131 today. Pt asymptomatic. Continue to monitor. Stop the IV fluids. Repeat bmp  In am.   Dysphagia Diet as per  nutritionist, plan for continued SLP follow up on discharge.   Hypokalemia Replaced  History of alcohol abuse No signs of withdrawal at this time.  Mildly elevated liver enzymes Resolved.  .  Patient denies any nausea or vomiting at this time.   Anemia of chronic disease and anemia of blood loss from the hematoma:  Transfuse to keep hemoglobin greater than 7.    Essential hypertension:  bp parameters elevated since last night.  Added hydralazine prn to his regimen.      In view of poor functional status, clinical deterioration, persistent MRSA bacteremia and fevers palliative care consulted and is on board with discussions with the family.  He is currently DNR and further recommendations to follow.   DVT prophylaxis: SCDs Code Status: DNR Family Communication: discussed with son over the phone.  Disposition Plan: pending palcement, not safe for discharge home.    Consultants:   Infectious disease  Neurology  Procedures: CT of the abdomen pelvis on admission Antimicrobials:  IV vancomycin  Subjective: Pt is alert, reports persistent neck pain. No nausea, vomiting or abdominal pain. No chest pain or sob.   Objective: Vitals:   09/06/19 2355 09/07/19 0312 09/07/19 0617 09/07/19 0757  BP: (!) 171/69 (!) 184/66 (!) 177/61 (!) 175/43  Pulse: 95 99 93 95  Resp: 20 19 (!) 21 20  Temp: 98.8 F (37.1 C) 99 F (37.2 C)  99 F (37.2 C)  TempSrc: Axillary Axillary  Axillary  SpO2: 98% 97% 98% 98%  Weight:        Intake/Output Summary (Last 24 hours) at 09/07/2019 1721 Last data filed at 09/07/2019 1700 Gross per 24 hour  Intake 1236.8 ml  Output 1000 ml  Net 236.8 ml   Filed Weights   09/03/19 0401 09/04/19 0332 09/05/19 0451  Weight: 67.6 kg 67.5 kg 68.4 kg    Examination:  General exam: alert and comfortable, not in distress.  Respiratory system: air entry fair , no wheezing or rhonchi. On 2 lit of [place] oxygen.  Cardiovascular system: S1s2 , RRR, no pedal  edema.  Gastrointestinal system: abdomen is soft, non tender non distended, bowel sounds normal.   Central nervous system: alert and oriented to place and person.  Extremities: no cyanosis . Skin: no rashes.  Psychiatry: mood is appropriate.      Data Reviewed: I have personally reviewed following labs and imaging studies  CBC: Recent Labs  Lab 09/03/19 0340 09/04/19 0359 09/05/19 0316 09/06/19 0315 09/07/19 0624  WBC 15.9* 14.5* 11.8* 11.4* 11.1*  NEUTROABS 13.1* 11.5* 9.2* 8.7* 8.3*  HGB 8.8* 8.3* 8.5* 8.6* 8.4*  HCT 26.0* 24.4* 24.6* 25.3* 25.0*  MCV 94.5 95.3 92.8 93.4 95.4  PLT 363 457* 565* 630* 628*   Basic Metabolic Panel: Recent Labs  Lab 09/01/19 0908 09/02/19 0334 09/03/19 0340 09/05/19 1218  NA 141 139 135 131*  K 3.2* 3.7 3.5 3.7  CL 104 105 102 94*  CO2 28 26 27 28   GLUCOSE 121* 106* 110* 96  BUN 8 10 8  6*  CREATININE 0.50* 0.50* 0.47* 0.54*  CALCIUM 8.0* 7.7* 7.7* 7.9*  MG 1.9  --   --   --  GFR: CrCl cannot be calculated (Unknown ideal weight.). Liver Function Tests: Recent Labs  Lab 09/01/19 0908 09/02/19 0334 09/03/19 0340 09/05/19 1218  AST 50* 100* 61* 36  ALT 38 61* 49* 33  ALKPHOS 75 81 64 69  BILITOT 0.8 0.7 0.5 1.0  PROT 5.7* 5.4* 5.3* 6.1*  ALBUMIN 1.5* 1.4* 1.3* 1.3*   No results for input(s): LIPASE, AMYLASE in the last 168 hours. No results for input(s): AMMONIA in the last 168 hours. Coagulation Profile: No results for input(s): INR, PROTIME in the last 168 hours. Cardiac Enzymes: No results for input(s): CKTOTAL, CKMB, CKMBINDEX, TROPONINI in the last 168 hours. BNP (last 3 results) No results for input(s): PROBNP in the last 8760 hours. HbA1C: No results for input(s): HGBA1C in the last 72 hours. CBG: Recent Labs  Lab 09/06/19 1951 09/06/19 2354 09/07/19 0310 09/07/19 0826 09/07/19 1221  GLUCAP 152* 130* 94 91 97   Lipid Profile: No results for input(s): CHOL, HDL, LDLCALC, TRIG, CHOLHDL, LDLDIRECT in the  last 72 hours. Thyroid Function Tests: No results for input(s): TSH, T4TOTAL, FREET4, T3FREE, THYROIDAB in the last 72 hours. Anemia Panel: No results for input(s): VITAMINB12, FOLATE, FERRITIN, TIBC, IRON, RETICCTPCT in the last 72 hours. Sepsis Labs: No results for input(s): PROCALCITON, LATICACIDVEN in the last 168 hours.  Recent Results (from the past 240 hour(s))  Culture, blood (routine x 2)     Status: Abnormal   Collection Time: 08/28/19  8:34 PM   Specimen: BLOOD LEFT HAND  Result Value Ref Range Status   Specimen Description BLOOD LEFT HAND  Final   Special Requests   Final    BOTTLES DRAWN AEROBIC ONLY Blood Culture results may not be optimal due to an inadequate volume of blood received in culture bottles   Culture  Setup Time   Final    AEROBIC BOTTLE ONLY GRAM POSITIVE COCCI CRITICAL VALUE NOTED.  VALUE IS CONSISTENT WITH PREVIOUSLY REPORTED AND CALLED VALUE.    Culture (A)  Final    STAPHYLOCOCCUS AUREUS SUSCEPTIBILITIES PERFORMED ON PREVIOUS CULTURE WITHIN THE LAST 5 DAYS. Performed at [facility], [address] [place], [place] [address]    Report Status 08/31/2019 FINAL  Final  Culture, blood (routine x 2)     Status: Abnormal   Collection Time: 08/28/19  8:34 PM   Specimen: BLOOD RIGHT HAND  Result Value Ref Range Status   Specimen Description BLOOD RIGHT HAND  Final   Special Requests   Final    BOTTLES DRAWN AEROBIC ONLY Blood Culture results may not be optimal due to an inadequate volume of blood received in culture bottles   Culture  Setup Time   Final    GRAM POSITIVE COCCI IN CLUSTERS AEROBIC BOTTLE ONLY CRITICAL RESULT CALLED TO, READ BACK BY AND VERIFIED WITH: PHARMD CHRIS WALSTON [phone] FCP Performed at [facility], [address] [place], [place] [address]    Culture METHICILLIN RESISTANT STAPHYLOCOCCUS AUREUS (A)  Final   Report Status 08/31/2019 FINAL  Final   Organism ID, Bacteria METHICILLIN RESISTANT STAPHYLOCOCCUS  AUREUS  Final      Susceptibility   Methicillin resistant staphylococcus aureus - MIC*    CIPROFLOXACIN >=8 RESISTANT Resistant     ERYTHROMYCIN >=8 RESISTANT Resistant     GENTAMICIN <=0.5 SENSITIVE Sensitive     OXACILLIN >=4 RESISTANT Resistant     TETRACYCLINE <=1 SENSITIVE Sensitive     VANCOMYCIN 1 SENSITIVE Sensitive     TRIMETH/SULFA <=10  SENSITIVE Sensitive     CLINDAMYCIN <=0.25 SENSITIVE Sensitive     RIFAMPIN <=0.5 SENSITIVE Sensitive     Inducible Clindamycin NEGATIVE Sensitive     * METHICILLIN RESISTANT STAPHYLOCOCCUS AUREUS  Culture, blood (routine x 2)     Status: None   Collection Time: 09/01/19  9:03 AM   Specimen: BLOOD  Result Value Ref Range Status   Specimen Description BLOOD RIGHT ANTECUBITAL  Final   Special Requests   Final    BOTTLES DRAWN AEROBIC AND ANAEROBIC Blood Culture adequate volume   Culture   Final    NO GROWTH 5 DAYS Performed at [facility], [address] [place], [place] [address]    Report Status 09/06/2019 FINAL  Final  Culture, blood (routine x 2)     Status: None   Collection Time: 09/01/19  9:08 AM   Specimen: BLOOD LEFT HAND  Result Value Ref Range Status   Specimen Description BLOOD LEFT HAND  Final   Special Requests AEROBIC BOTTLE ONLY Blood Culture adequate volume  Final   Culture   Final    NO GROWTH 5 DAYS Performed at [facility], [address] [place], [place] [address]    Report Status 09/06/2019 FINAL  Final  MRSA PCR Screening     Status: Abnormal   Collection Time: 09/06/19  6:11 AM   Specimen: Nasopharyngeal  Result Value Ref Range Status   MRSA by PCR POSITIVE (A) NEGATIVE Final    Comment:        The GeneXpert MRSA Assay (FDA approved for NASAL specimens only), is one component of a comprehensive MRSA colonization surveillance program. It is not intended to diagnose MRSA infection nor to guide or monitor treatment for MRSA infections. RESULT CALLED TO, READ BACK BY AND VERIFIED WITH: RN  Alan RipperCLAIRE THOMPSON [phone] FCP Performed at [facility], [address] [place], [place] [address]          Radiology Studies: No results found.      Scheduled Meds: . baclofen  5 mg Oral BID  . Chlorhexidine Gluconate Cloth  6 each Topical Daily  . feeding supplement (NEPRO CARB STEADY)  237 mL Oral BID BM  . feeding supplement (OSMOLITE 1.5 CAL)  1,000 mL Per Tube Q24H  . feeding supplement (PRO-STAT SUGAR FREE 64)  30 mL Per Tube BID  . folic acid  1 mg Oral Daily   Or  . folic acid  1 mg Intravenous Daily  . LORazepam  1 mg Intravenous Once  . multivitamin with minerals  1 tablet Oral Daily  . sodium chloride flush  10-40 mL Intracatheter Q12H  . thiamine  100 mg Oral Daily   Or  . thiamine  100 mg Intravenous Daily   Continuous Infusions: . sodium chloride 1,000 mL (09/07/19 0957)  . vancomycin 1,250 mg (09/07/19 1001)     LOS: 10 days        Kathlen ModyVijaya Roschelle Calandra, MD [facility] 09/07/2019, 5:21 PM

## 2019-09-07 NOTE — Evaluation (Signed)
Speech Language Pathology Evaluation Patient Details Name: Ryan Lane MRN: [id number] DOB: [birth year] Today's Date: 09/07/2019 Time: 6712-4580 SLP Time Calculation (min) (ACUTE ONLY): 25 min  Problem List:  Patient Active Problem List   Diagnosis Date Noted  . Encounter for hospice care discussion   . Goals of care, counseling/discussion   . Palliative care by specialist   . Dyspnea and respiratory abnormalities   . Palliative care encounter   . MRSA bacteremia 08/28/2019  . Alcohol withdrawal ([place]) 08/28/2019  . Acute CVA (cerebrovascular accident) ([place]) 08/28/2019  . Hyponatremia 08/28/2019   Past Medical History: History reviewed. No pertinent past medical history. Past Surgical History:  Past Surgical History:  Procedure Laterality Date  . BUBBLE STUDY  09/02/2019   Procedure: BUBBLE STUDY;  Surgeon: Pixie Casino, MD;  Location: [place];  Service: Cardiovascular;;  . TEE WITHOUT CARDIOVERSION N/A 09/02/2019   Procedure: TRANSESOPHAGEAL ECHOCARDIOGRAM (TEE);  Surgeon: Pixie Casino, MD;  Location: [facility];  Service: Cardiovascular;  Laterality: N/A;   HPI:  Pt is a 68 y.o. M with significant PMH of alcohol and polysubstance abuse who presents to ER with fever, chills, body aches and diarrhea. Admitted with pneumonia/bacteremia with MRSA, transferred to [facility] on 08/28/2019 for further treatment and possible TEE. CT chest/abd/pelvis showing left psoas intramuscular hematoma with evidence of small active bleed. 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    Assessment / Plan / Recommendation Clinical Impression  The [place] Medical Status [facility]) Examination was administered. Pt achieved a score of 5/24 (Not all subtests administered - Pt declined to participate in clock drawing and writing tasks). This score indicates significant neurocognitive impairment. Points were lost on orientation, immediate and delayed recall, thought  organization, attention and functional recall. These deficits raise concern for safety, given level of independence prior to admit. 24 hour supervision is recommended at DC. SLP will follow acutely, focusing on increasing basic cognitive linguistic skills.    SLP Assessment  SLP Recommendation/Assessment: Patient needs continued Speech Language Pathology Services SLP Visit Diagnosis: Cognitive communication deficit (R41.841)    Follow Up Recommendations  Skilled Nursing facility;24 hour supervision/assistance    Frequency and Duration min 2x/week  2 weeks      SLP Evaluation Cognition  Overall Cognitive Status: No family/caregiver present to determine baseline cognitive functioning Arousal/Alertness: Awake/alert Orientation Level: Oriented to person;Oriented to place;Disoriented to time;Oriented to situation Attention: Focused;Sustained Focused Attention: Appears intact Sustained Attention: Impaired Sustained Attention Impairment: Verbal basic Memory: Impaired Memory Impairment: Storage deficit;Decreased short term memory;Retrieval deficit;Decreased recall of new information Decreased Short Term Memory: Verbal basic Awareness: Impaired Problem Solving: Impaired Problem Solving Impairment: Verbal basic Safety/Judgment: Impaired       Comprehension  Auditory Comprehension Overall Auditory Comprehension: Appears within functional limits for tasks assessed    Expression Expression Primary Mode of Expression: Verbal Verbal Expression Overall Verbal Expression: Appears within functional limits for tasks assessed Written Expression Dominant Hand: Right   Oral / Motor  Oral Motor/Sensory Function Overall Oral Motor/Sensory Function: Generalized oral weakness Motor Speech Overall Motor Speech: Impaired Respiration: Within functional limits Phonation: Wet;Low vocal intensity Resonance: Within functional limits Articulation: Impaired Level of Impairment:  Phrase Intelligibility: Intelligibility reduced Word: 75-100% accurate Phrase: 50-74% accurate Sentence: 50-74% accurate Conversation: 50-74% accurate Motor Planning: Witnin functional limits Motor Speech Errors: Not applicable   GO                   Ryan Lane, [place], CCC-SLP Speech Language Pathologist  Office: [phone] Pager: [phone]  [name] 09/07/2019, 1:31 PM

## 2019-09-07 NOTE — Progress Notes (Signed)
Orthopedic Tech Progress Note Patient Details:  Ryan Lane 1951/02/02 [id number] RN called requesting a soft collar for the patient.  Ortho Devices Type of Ortho Device: Soft collar Ortho Device/Splint Location: Neck Ortho Device/Splint Interventions: Application, Ordered   Post Interventions Patient Tolerated: Well Instructions Provided: Care of device, Adjustment of device   [name] 09/07/2019, 12:37 PM

## 2019-09-07 NOTE — Plan of Care (Signed)
  Problem: Nutrition: Goal: Risk of aspiration will decrease Outcome: Not Progressing   

## 2019-09-07 NOTE — TOC Progression Note (Addendum)
Transition of Care [facility]) - Progression Note    Patient Details  Name: Ryan Lane MRN: [id number] Date of Birth: [birth year]  Transition of Care [facility]) CM/SW [place], [place] Phone Number: 09/07/2019, 2:26 PM  Clinical Narrative:     CSW called and left a message with SNF options for patient's son. CSW asked for a return phone call to give her a choice of which facility he would like his father to go for SNF.    CSW is awaiting a return phone call. CSW asked Dr. Karleen Hampshire for a COVID screen.   CSW will continue to follow and assist with discharge planning.   Expected Discharge Plan: [place] Barriers to Discharge: Continued Medical Work up  Expected Discharge Plan and Services Expected Discharge Plan: [place] In-house Referral: Clinical Social Work Discharge Planning Services: NA Post Acute Care Choice: [place] Living arrangements for the past 2 months: Single Family Home                 DME Arranged: N/A DME Agency: NA       HH Arranged: NA HH Agency: NA         Social Determinants of Health (SDOH) Interventions    Readmission Risk Interventions No flowsheet data found.

## 2019-09-07 NOTE — Anesthesia Preprocedure Evaluation (Addendum)
Anesthesia Evaluation  Patient identified by MRN, date of birth, ID bandGeneral Assessment Comment:Patient awake  Reviewed: Allergy & Precautions, NPO status , Patient's Chart, lab work & pertinent test results  Airway Mallampati: III  TM Distance: >3 FB Neck ROM: Full    Dental  (+) Edentulous Upper, Edentulous Lower   Pulmonary Current Smoker and Patient abstained from smoking.,    Pulmonary exam normal breath sounds clear to auscultation       Cardiovascular negative cardio ROS Normal cardiovascular exam Rhythm:Regular Rate:Normal  ECG: ST, rate 119  ECHO: 1. Left ventricular ejection fraction, by visual estimation, is 65 to 70%. The left ventricle has hyperdynamic function. There is moderately increased left ventricular hypertrophy. 2. Global right ventricle has normal systolic function.The right ventricular size is normal. No increase in right ventricular wall thickness. 3. Left atrial size was mildly dilated 4. Right atrial size was normal. 5. The aortic valve is tricuspid. Aortic valve regurgitation is not visualized. 6. The mitral valve is grossly normal. Trace mitral valve regurgitation. 7. The tricuspid valve is grossly normal. Tricuspid valve regurgitation is mild. 8. The pulmonic valve was grossly normal. Pulmonic valve regurgitation is not visualized. 9. Moderate pleural effusion in the left lateral region. 10. No evidence of any gross valvular vegetations.   Neuro/Psych PSYCHIATRIC DISORDERS CVA    GI/Hepatic negative GI ROS, (+)     substance abuse  ,   Endo/Other  negative endocrine ROS  Renal/GU negative Renal ROS     Musculoskeletal negative musculoskeletal ROS (+)   Abdominal   Peds  Hematology  (+) anemia ,   Anesthesia Other Findings MRI of head  Reproductive/Obstetrics                           Anesthesia Physical Anesthesia Plan  ASA: III  Anesthesia Plan:  General   Post-op Pain Management:    Induction: Intravenous  PONV Risk Score and Plan: 1 and Ondansetron, Dexamethasone and Treatment may vary due to age or medical condition  Airway Management Planned: Oral ETT  Additional Equipment:   Intra-op Plan:   Post-operative Plan: Extubation in OR  Informed Consent: I have reviewed the patients History and Physical, chart, labs and discussed the procedure including the risks, benefits and alternatives for the proposed anesthesia with the patient or authorized representative who has indicated his/her understanding and acceptance.     Consent reviewed with POA  Plan Discussed with: CRNA  Anesthesia Plan Comments: (Anesthetic plan discussed with son via telephone )      Anesthesia Quick Evaluation

## 2019-09-08 ENCOUNTER — Inpatient Hospital Stay (HOSPITAL_COMMUNITY): Payer: Medicare Other

## 2019-09-08 ENCOUNTER — Encounter (HOSPITAL_COMMUNITY): Payer: Self-pay | Admitting: Neurological Surgery

## 2019-09-08 ENCOUNTER — Encounter (HOSPITAL_COMMUNITY)
Admission: AD | Disposition: A | Discharge status: Skilled Nursing Facility | Payer: Self-pay | Source: Other Acute Inpatient Hospital | Attending: Internal Medicine

## 2019-09-08 ENCOUNTER — Inpatient Hospital Stay (HOSPITAL_COMMUNITY): Payer: Medicare Other | Admitting: Certified Registered"

## 2019-09-08 DIAGNOSIS — G062 Extradural and subdural abscess, unspecified: Secondary | ICD-10-CM

## 2019-09-08 HISTORY — PX: RADIOLOGY WITH ANESTHESIA: SHX6223

## 2019-09-08 LAB — CBC WITH DIFFERENTIAL/PLATELET
Abs Immature Granulocytes: 0.07 10*3/uL (ref 0.00–0.07)
Basophils Absolute: 0.1 10*3/uL (ref 0.0–0.1)
Basophils Relative: 1 %
Eosinophils Absolute: 0.1 10*3/uL (ref 0.0–0.5)
Eosinophils Relative: 1 %
HCT: 24.3 % — ABNORMAL LOW (ref 39.0–52.0)
Hemoglobin: 8.3 g/dL — ABNORMAL LOW (ref 13.0–17.0)
Immature Granulocytes: 1 %
Lymphocytes Relative: 18 %
Lymphs Abs: 1.8 10*3/uL (ref 0.7–4.0)
MCH: 31.9 pg (ref 26.0–34.0)
MCHC: 34.2 g/dL (ref 30.0–36.0)
MCV: 93.5 fL (ref 80.0–100.0)
Monocytes Absolute: 0.9 10*3/uL (ref 0.1–1.0)
Monocytes Relative: 9 %
Neutro Abs: 7 10*3/uL (ref 1.7–7.7)
Neutrophils Relative %: 70 %
Platelets: 672 10*3/uL — ABNORMAL HIGH (ref 150–400)
RBC: 2.6 MIL/uL — ABNORMAL LOW (ref 4.22–5.81)
RDW: 12.7 % (ref 11.5–15.5)
WBC: 10 10*3/uL (ref 4.0–10.5)
nRBC: 0 % (ref 0.0–0.2)

## 2019-09-08 LAB — BASIC METABOLIC PANEL
Anion gap: 7 (ref 5–15)
BUN: <5 mg/dL — ABNORMAL LOW (ref 8–23)
CO2: 30 mmol/L (ref 22–32)
Calcium: 8.1 mg/dL — ABNORMAL LOW (ref 8.9–10.3)
Chloride: 96 mmol/L — ABNORMAL LOW (ref 98–111)
Creatinine, Ser: 0.46 mg/dL — ABNORMAL LOW (ref 0.61–1.24)
GFR calc Af Amer: >60 mL/min (ref >60)
GFR calc non Af Amer: >60 mL/min (ref >60)
Glucose, Bld: 89 mg/dL (ref 70–99)
Potassium: 3.2 mmol/L — ABNORMAL LOW (ref 3.5–5.1)
Sodium: 133 mmol/L — ABNORMAL LOW (ref 135–145)

## 2019-09-08 LAB — GLUCOSE, CAPILLARY
Glucose-Capillary: 91 mg/dL (ref 70–99)
Glucose-Capillary: 83 mg/dL (ref 70–99)
Glucose-Capillary: 112 mg/dL — ABNORMAL HIGH (ref 70–99)
Glucose-Capillary: 95 mg/dL (ref 70–99)

## 2019-09-08 IMAGING — MR MR CERVICAL SPINE WO/W CM
6 of 12 series · 23 of 48 positions shown · IV contrast (Gadavist)
Comparison: None.

CLINICAL DATA: Fever delirium back pain. Bacteremia.

EXAM:
MRI CERVICAL SPINE WITHOUT AND WITH CONTRAST
TECHNIQUE: Multiplanar and multiecho pulse sequences of the cervical spine, to
include the craniocervical junction and cervicothoracic junction,
were obtained without and with intravenous contrast.
CONTRAST:  8mL GADAVIST GADOBUTROL 1 MMOL/ML IV SOLN

[Series 30: T2 · sagittal · 3.0mm · 0.69mm/px · 1 of 15 slices shown (1 of 2)]
[im 1/15]
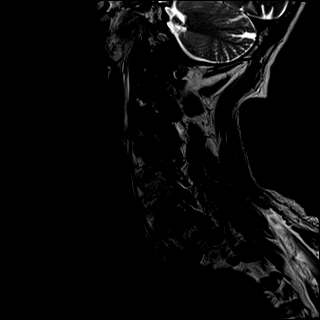

[Series 31: T1 · sagittal · 3.0mm · 0.69mm/px · 1 of 15 slices shown (1 of 4)]
[im 1/15]
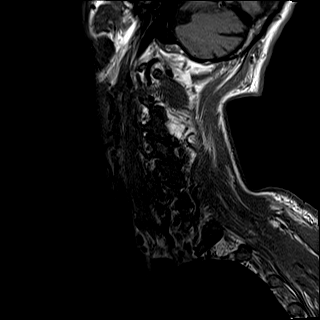

[Series 33: T2 · axial · 3.5mm · 0.66mm/px · z∈[-263,-114]mm · 6 of 40 slices shown (2 of 2)]
[im 1/40]
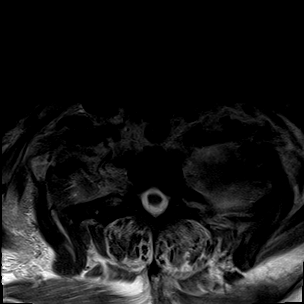
[im 8/40]
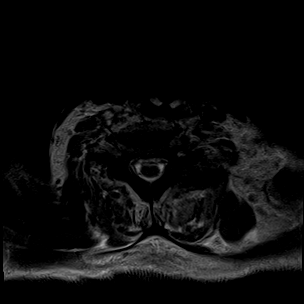
[im 16/40]
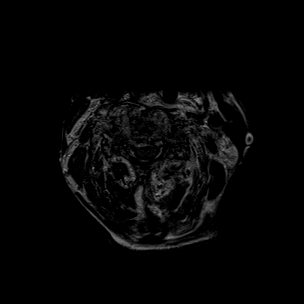
[im 24/40]
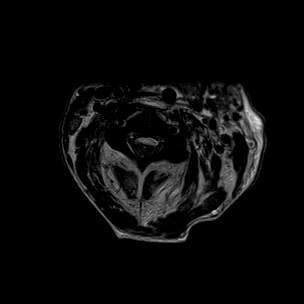
[im 32/40]
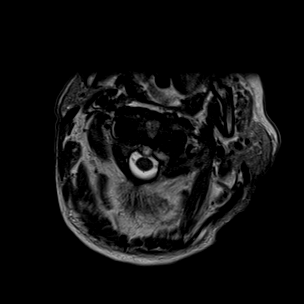
[im 40/40]
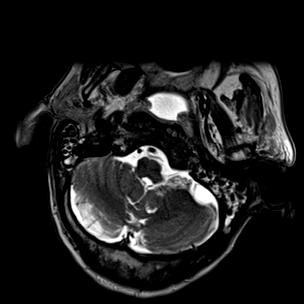

[Series 35: T1 · axial · 3.5mm · 0.39mm/px · z∈[-263,-114]mm · 6 of 40 slices shown (2 of 4)]
[im 1/40]
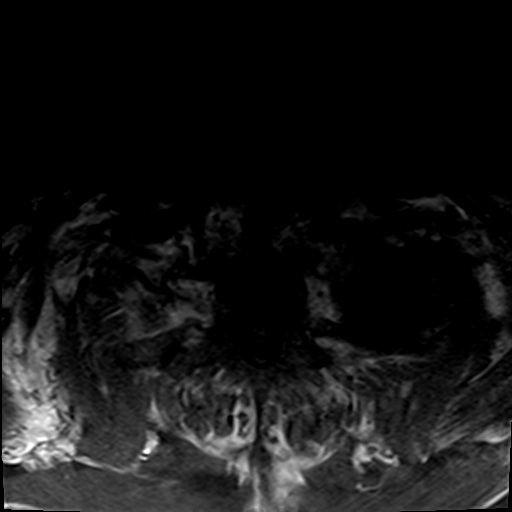
[im 8/40]
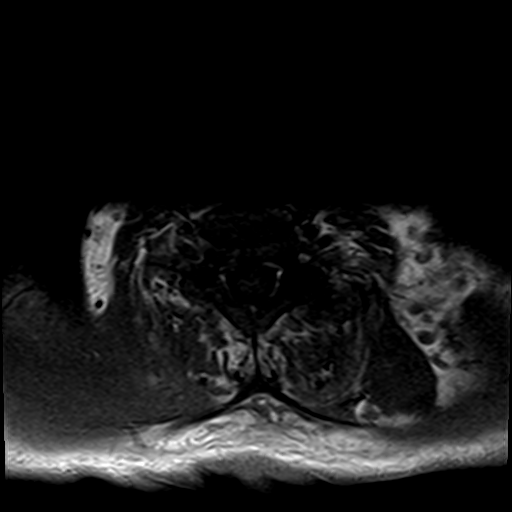
[im 16/40]
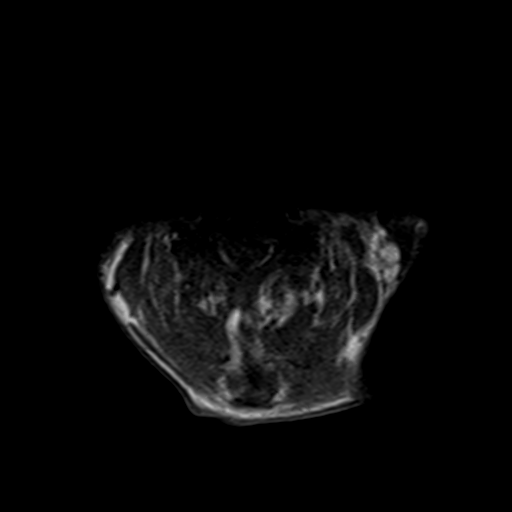
[im 24/40]
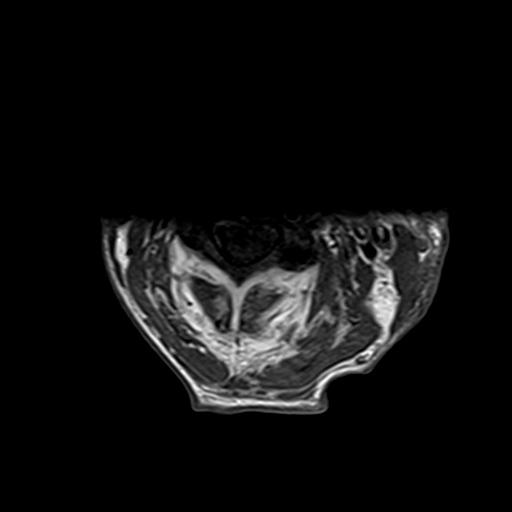
[im 32/40]
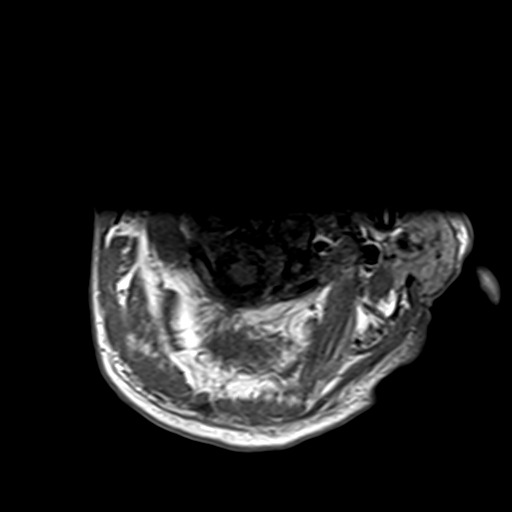
[im 40/40]
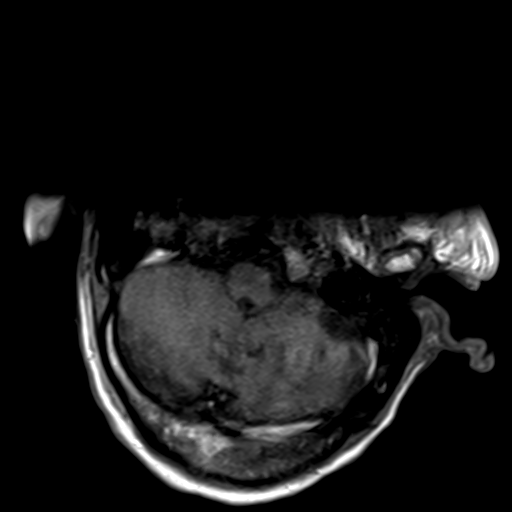

[Series 36: T1 · axial · 3.5mm · 0.39mm/px · z∈[-263,-114]mm · 6 of 40 slices shown (3 of 4)]
[im 1/40]
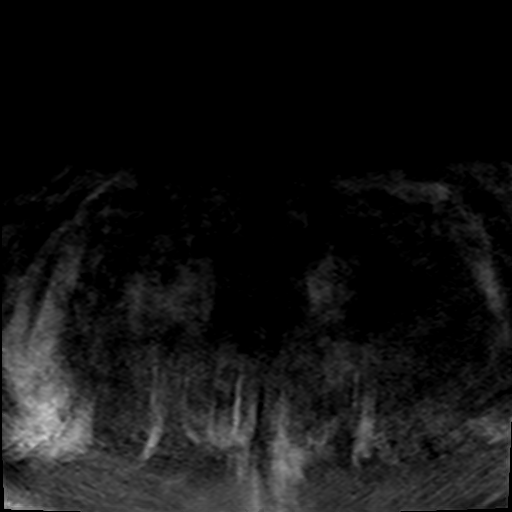
[im 8/40]
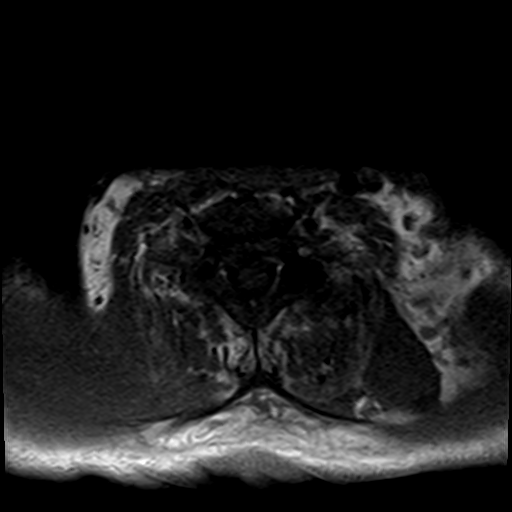
[im 16/40]
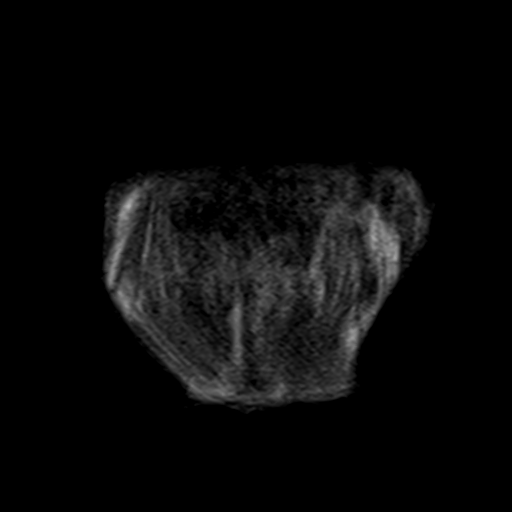
[im 24/40]
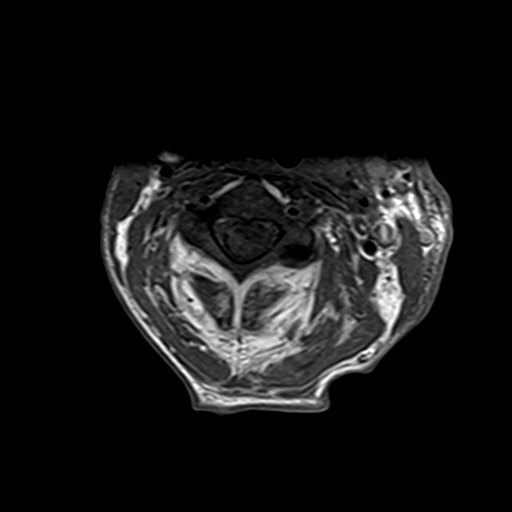
[im 32/40]
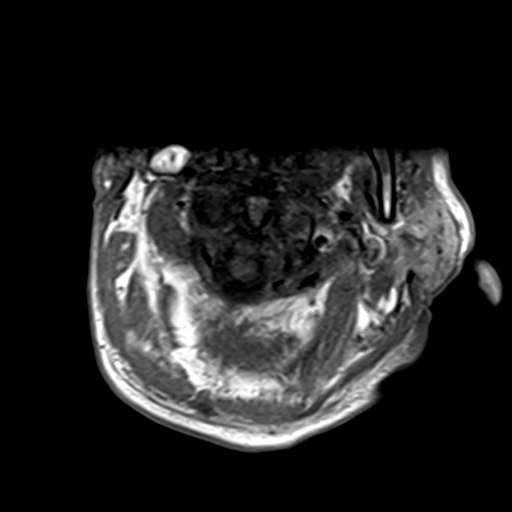
[im 40/40]
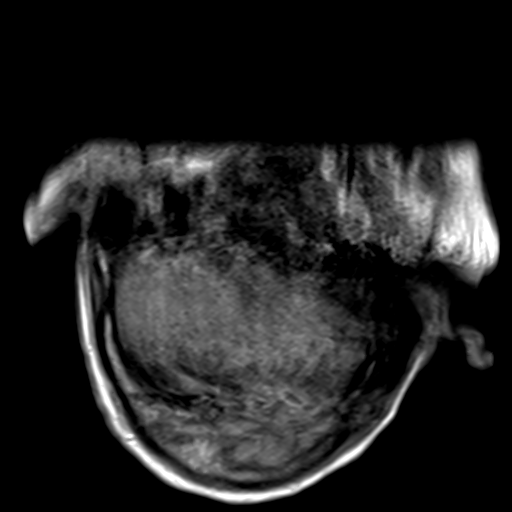

[Series 37: T1 · axial · 3.5mm · 0.39mm/px · z∈[-263,-206]mm · 3 of 40 slices shown (4 of 4)]
[im 1/40]
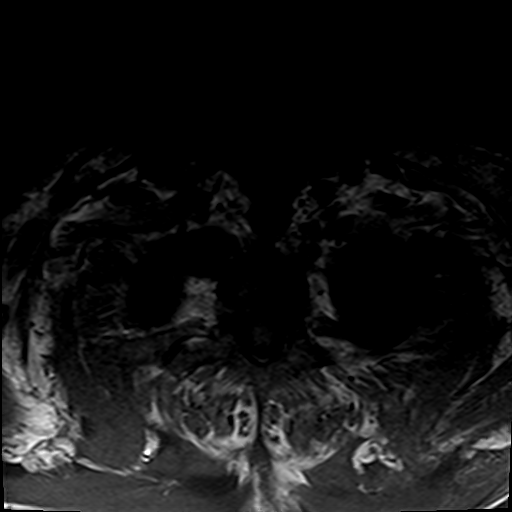
[im 8/40]
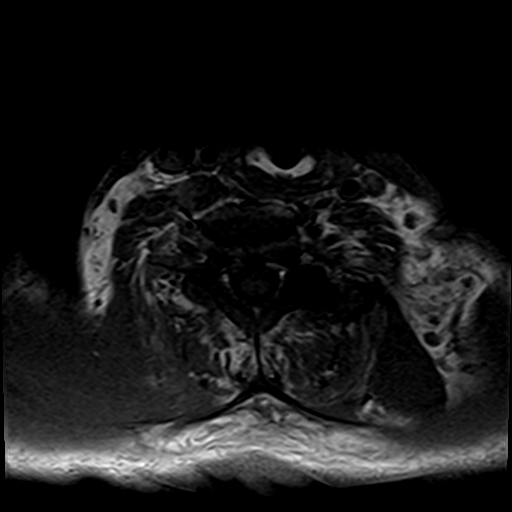
[im 16/40]
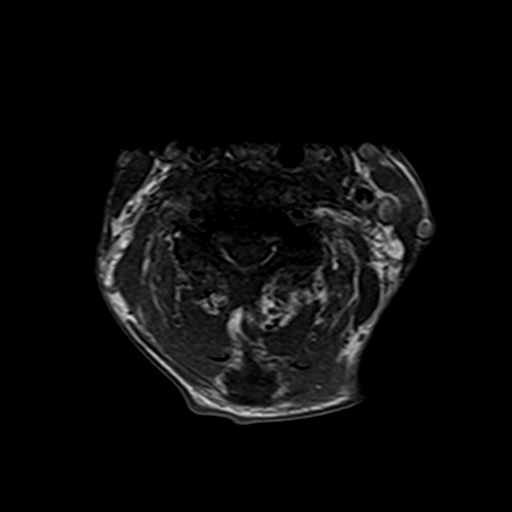

[23 of 48 positions shown; findings below may reference images not displayed]

FINDINGS: Alignment: The study was performed with assistance of anesthesia.

Normal alignment

Vertebrae: Bone marrow edema and enhancement C4, C5, and C6.
Negative for fracture.

Cord: Multilevel spinal stenosis. Cord compression at C2-3 due to
large ventral epidural fluid collection compatible with abscess. No
cord signal abnormality.

Posterior Fossa, vertebral arteries, paraspinal tissues: The patient
is intubated with secretions in the pharynx.

Disc levels:

Large ventral epidural fluid collection with rim enhancement
extending from C1 through C3-4. The fluid collection measures
approximately 8 x 25 mm in transverse dimension and extends over
cm. This is causing posterior displacement of the cord with cord
compression. Bone marrow edema and disc space edema at C4-5 and C5-6
are most consistent with discitis and osteomyelitis. There is
diffuse dural thickening in the cervical spine due to infection.

C1-2: Degenerative change. Ventral epidural abscess extends to this
level causing posterior displacement of the cord and cord
compression

C2-3: Ventral epidural abscess with cord compression and moderate to
severe spinal stenosis.

C3-4: Disc degeneration and spurring. Mild spinal stenosis. Ventral
epidural abscess terminates at this level. Neural foramina patent
bilaterally

C4-5: Bone marrow edema enhancement compatible with
discitis/osteomyelitis. Circumferential dural enhancement and
moderate spinal stenosis.

C5-6: Bone marrow edema and enhancement compatible with discitis
osteomyelitis. Prominent ventral epidural enhancement with posterior
displacement of the cord and moderate spinal stenosis. Ventral
epidural fluid collection measuring approximately 4 x 10 mm which
appears to be arising from the disc space may represent additional
epidural abscess behind the C5 vertebral body.

C6-7: Disc degeneration and spondylosis. Spinal and foraminal
stenosis due to spurring

C7-T1: Negative
IMPRESSION: Large ventral epidural abscess extending from C1 through C3 causing
cord compression and moderate to severe spinal stenosis.

Discitis and osteomyelitis at C4-5 and C5-6.

Smaller 4 x 10 mm ventral epidural abscess at C5 with moderate
spinal stenosis.

Diffuse dural thickening in the cervical spine due to infection.

These results were called by telephone at the time of interpretation
on [DATE] at [DATE] to provider COLLECTIONS , who verbally
acknowledged these results.

## 2019-09-08 IMAGING — MR MR THORACIC SPINE WO/W CM
5 of 9 series · 22 of 48 positions shown · IV contrast (gadavist)
Comparison: None.

CLINICAL DATA: Bacteremia.  Back pain

EXAM:
MRI THORACIC WITHOUT AND WITH CONTRAST
TECHNIQUE: Multiplanar and multiecho pulse sequences of the thoracic spine were
obtained without and with intravenous contrast.
CONTRAST:  8mL GADAVIST GADOBUTROL 1 MMOL/ML IV SOLN

[Series 15: T1 · sagittal · 3.0mm · 0.62mm/px · 2 of 9 slices shown (1 of 3)]
[im 1/9]
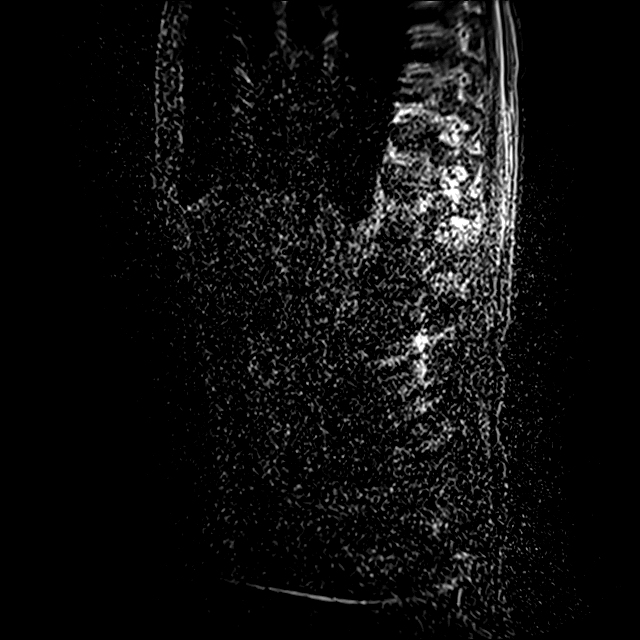
[im 9/9]
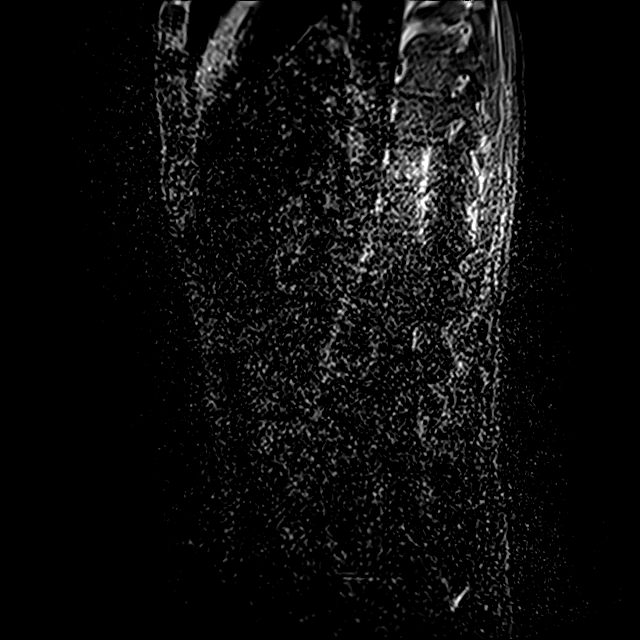

[Series 16: T1 · sagittal · 3.0mm · 0.62mm/px · 2 of 9 slices shown (2 of 3)]
[im 1/9]
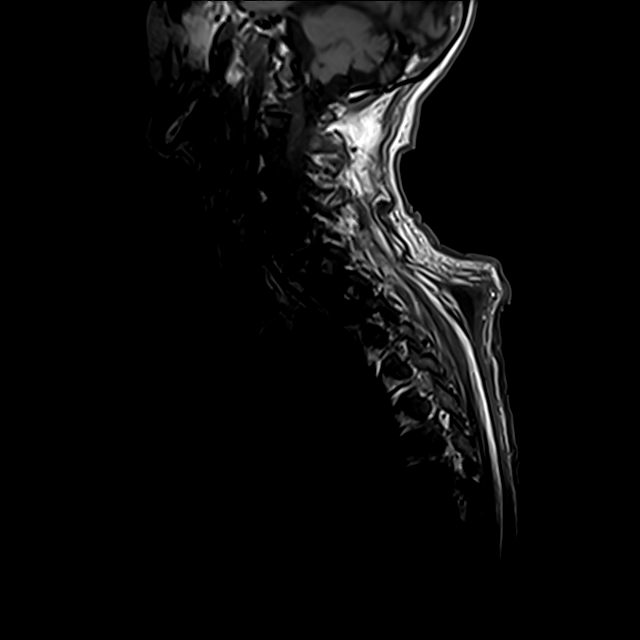
[im 9/9]
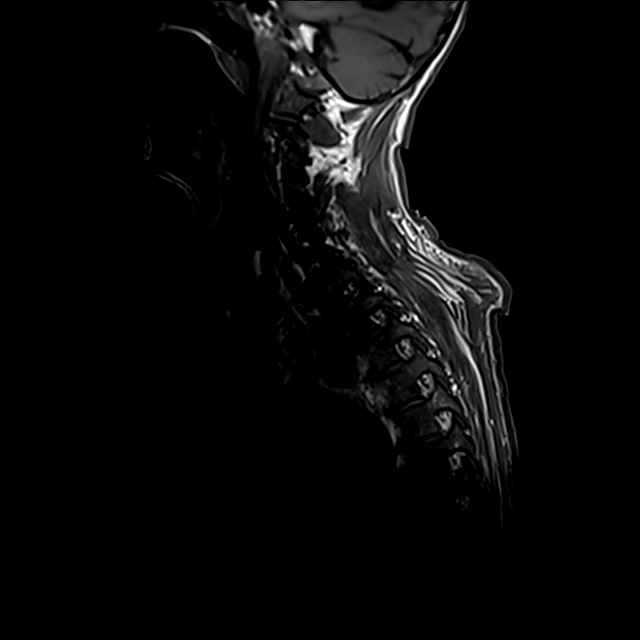

[Series 17: T1 · sagittal · 3.3mm · 0.62mm/px · 2 of 9 slices shown (3 of 3)]
[im 1/9]
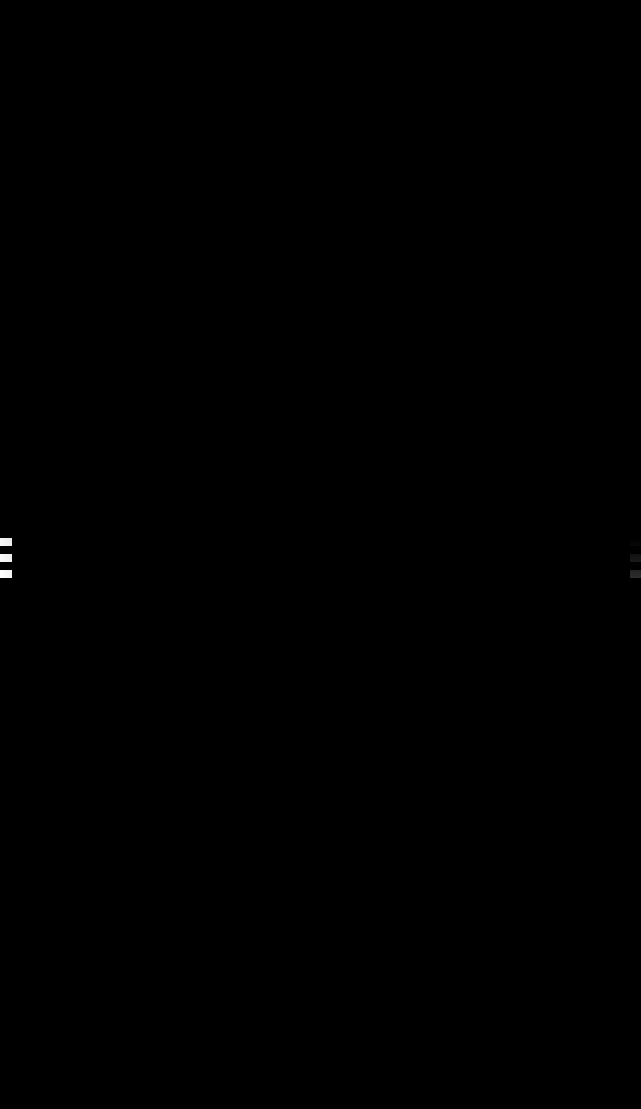
[im 9/9]
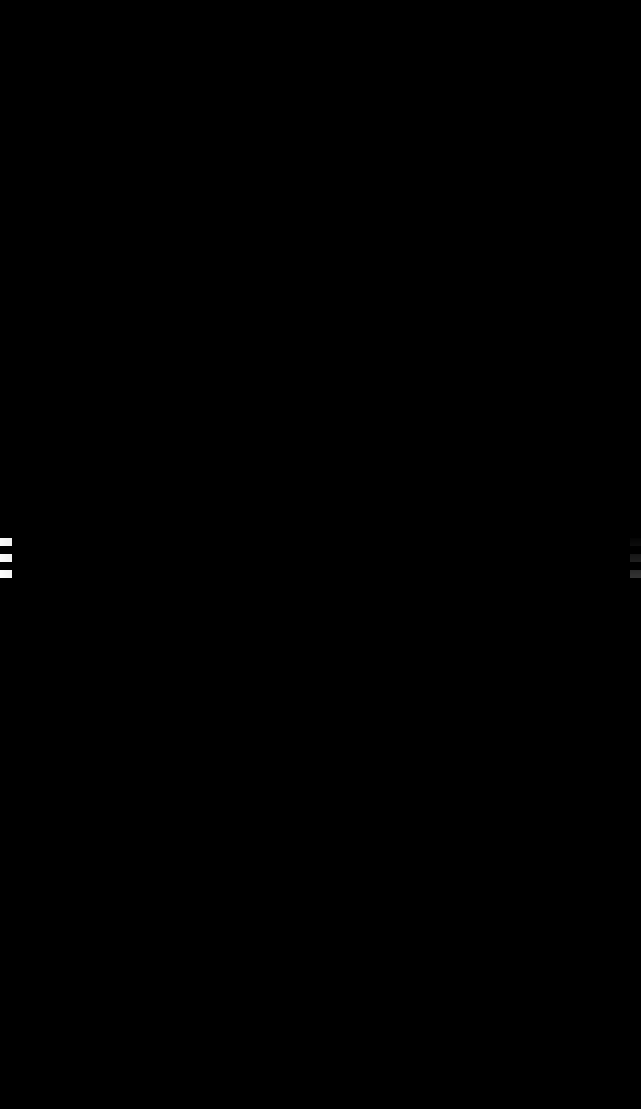

[Series 18: T2 · sagittal · 3.0mm · 0.76mm/px · 5 of 17 slices shown (1 of 2)]
[im 1/17]
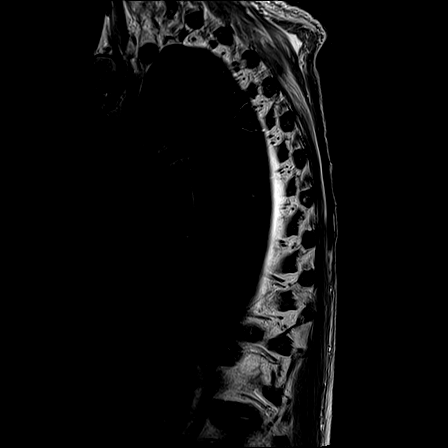
[im 5/17]
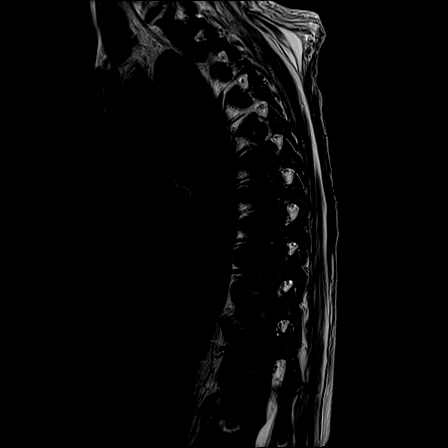
[im 9/17]
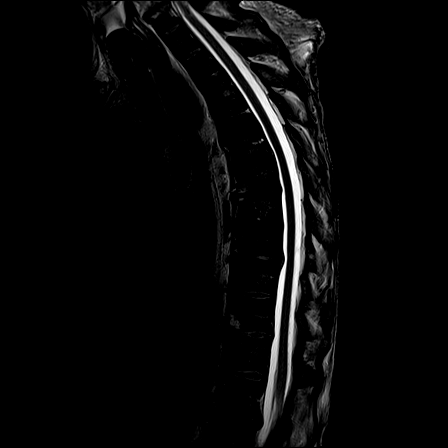
[im 13/17]
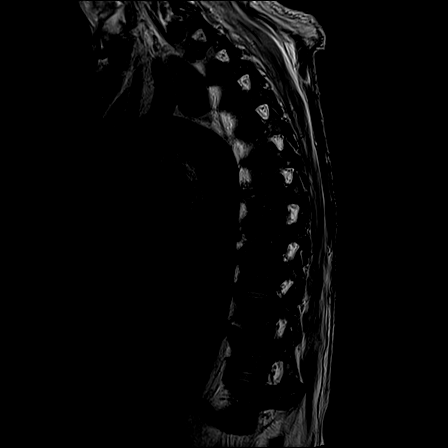
[im 17/17]
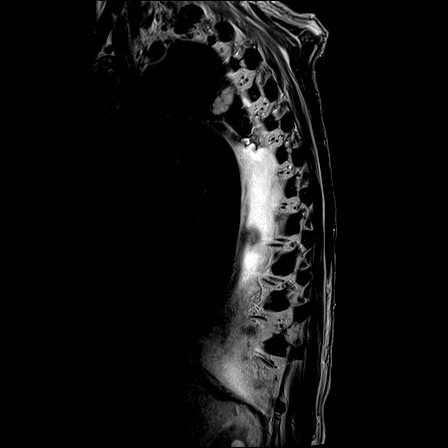

[Series 21: T2 · axial · 5.0mm · 0.59mm/px · z∈[-527,-256]mm · 11 of 42 slices shown (2 of 2)]
[im 1/42]
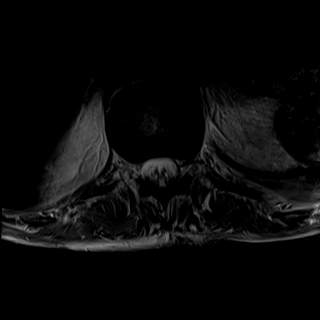
[im 5/42]
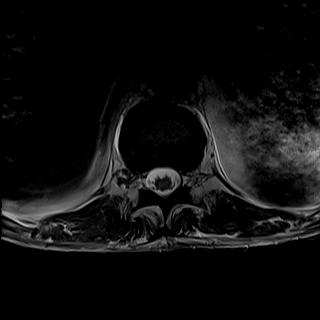
[im 9/42]
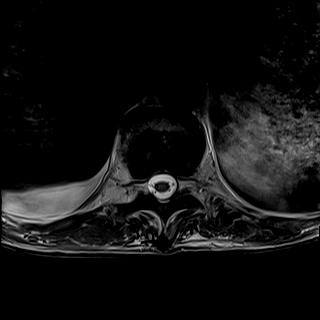
[im 13/42]
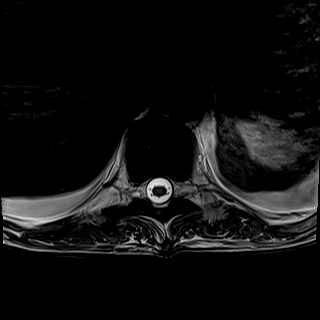
[im 17/42]
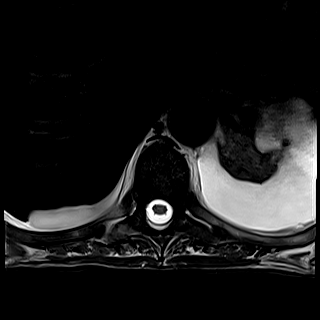
[im 21/42]
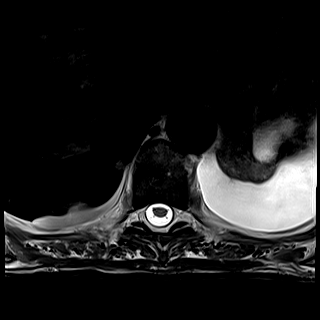
[im 25/42]
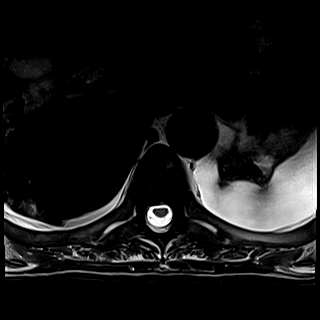
[im 29/42]
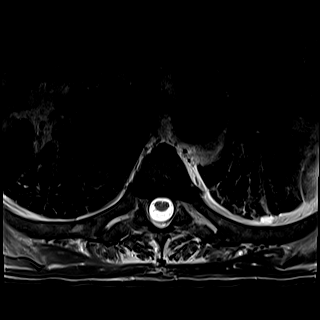
[im 33/42]
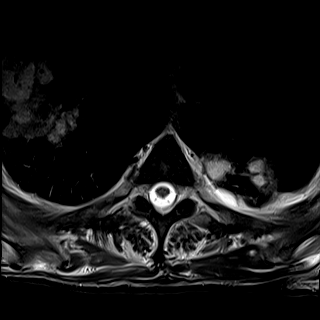
[im 37/42]
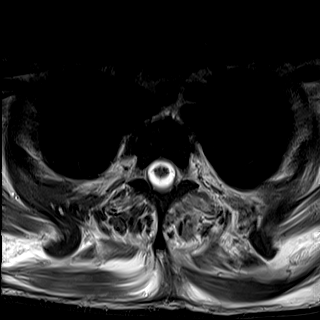
[im 42/42]
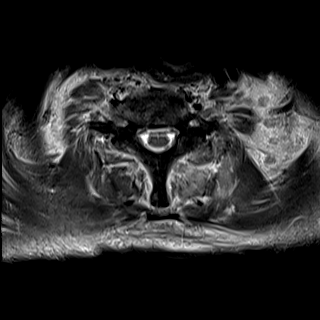

[22 of 48 positions shown; findings below may reference images not displayed]

FINDINGS: MRI THORACIC SPINE FINDINGS

Alignment:  Normal

Vertebrae: Negative for fracture or mass. No evidence of spinal
infection in the thoracic spine.

Cord:  Normal spinal cord signal.  No cord compression.

Paraspinal and other soft tissues: Moderately large loculated left
pleural effusion and small right pleural effusion. Multiple lung
nodules bilaterally. Prior chest CT also reported these nodules
which may represent septic emboli given history. Neoplasm could give
a similar appearance.

Soft tissue swelling and edema in the posterior soft tissues in the
upper back at approximately the C2-3 level. Small fluid collections
are present in the muscles at this level which could represent small
muscle abscesses. Right-sided fluid collection measures
approximately 12 x 24 mm. Left-sided fluid collection measures
approximately 8 x 15 mm.

Disc levels:

Mild thoracic degenerative changes. Abnormal disc spaces described
below.

T5-6: Small central disc protrusion

T6-7: Small central disc protrusion.

T8-9: Small central disc protrusion

T10-11: Small right-sided disc protrusion.
IMPRESSION: Negative for spinal infection in the thoracic spine. There are small
fluid collections within the posterior muscles at the C2-3 level
which could represent small soft tissue abscesses not extending into
the spinal canal

Mild thoracic disc degeneration.

Bilateral pleural effusions and bilateral lung nodules most likely
septic emboli given the history and prior imaging studies.

## 2019-09-08 IMAGING — MR MR HEAD WO/W CM
12 of 15 series · 39 of 48 positions shown · IV contrast (gadavist)
Comparison: MRI head [DATE]

CLINICAL DATA: Encephalopathy.  Fever, bacteremia.

EXAM:
MRI HEAD WITHOUT AND WITH CONTRAST
TECHNIQUE: Multiplanar, multiecho pulse sequences of the brain and surrounding
structures were obtained without and with intravenous contrast.
CONTRAST:  8mL GADAVIST GADOBUTROL 1 MMOL/ML IV SOLN

[Series 5: DWI · axial · 3.0mm · 0.88mm/px · z∈[-49,+78]mm · 6 of 96 slices shown (1 of 4)]
[im 1/96]
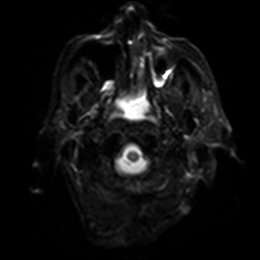
[im 20/96]
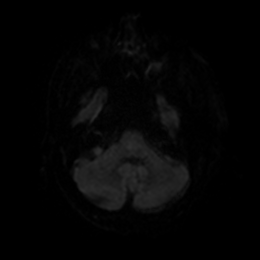
[im 39/96]
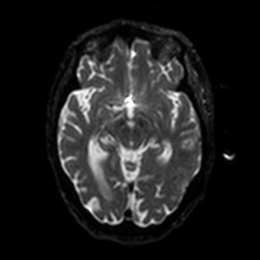
[im 58/96]
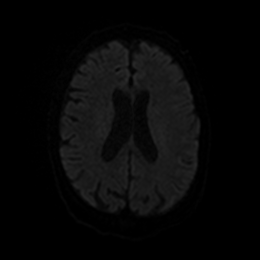
[im 77/96]
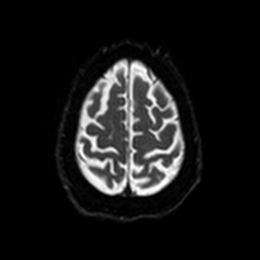
[im 96/96]
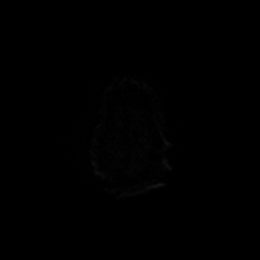

[Series 6: DWI · axial · 3.0mm · 0.88mm/px · z∈[-49,+78]mm · 4 of 48 slices shown (2 of 4)]
[im 1/48]
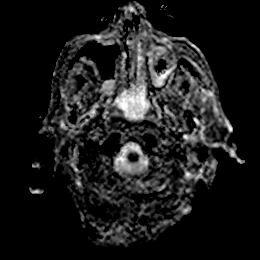
[im 16/48]
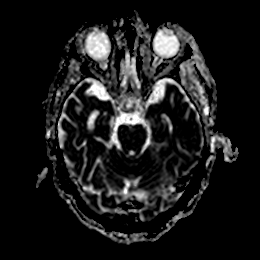
[im 32/48]
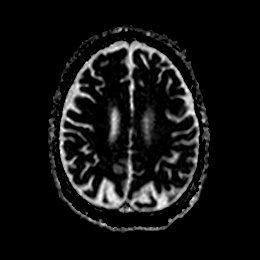
[im 48/48]
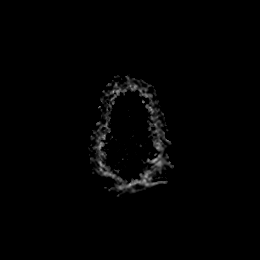

[Series 7: DWI · coronal · 4.0mm · 0.88mm/px · 6 of 74 slices shown (3 of 4)]
[im 1/74]
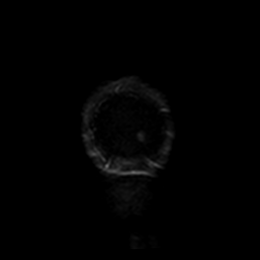
[im 15/74]
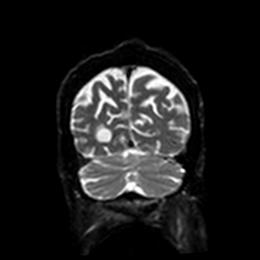
[im 30/74]
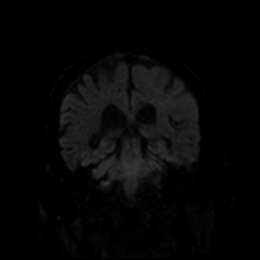
[im 44/74]
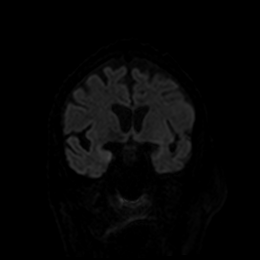
[im 59/74]
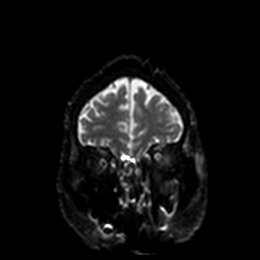
[im 74/74]
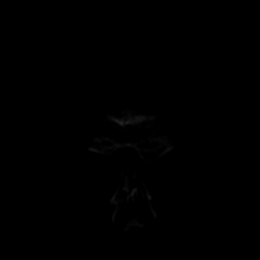

[Series 8: DWI · coronal · 4.0mm · 0.88mm/px · 3 of 37 slices shown (4 of 4)]
[im 1/37]
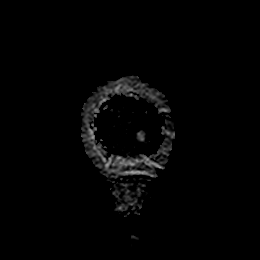
[im 19/37]
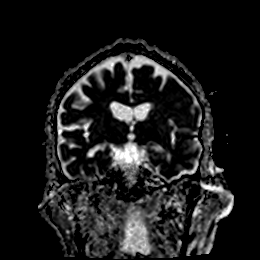
[im 37/37]
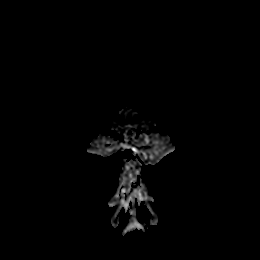

[Series 9: T1 · sagittal · 5.0mm · 0.75mm/px · 2 of 25 slices shown]
[im 1/25]
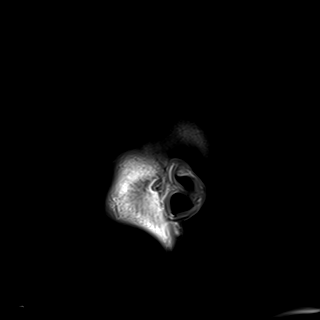
[im 25/25]
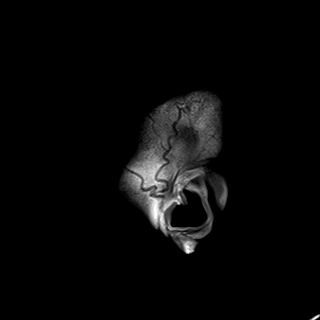

[Series 10: T2 · axial · 5.0mm · 0.72mm/px · z∈[-49,+78]mm · 2 of 25 slices shown]
[im 1/25]
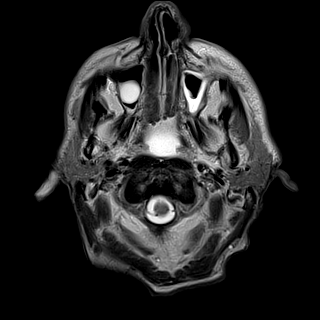
[im 25/25]
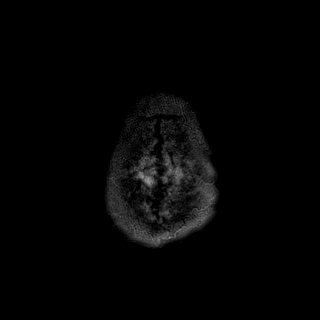

[Series 11: FLAIR · axial · 5.0mm · 0.45mm/px · z∈[-51,+75]mm · 2 of 25 slices shown]
[im 1/25]
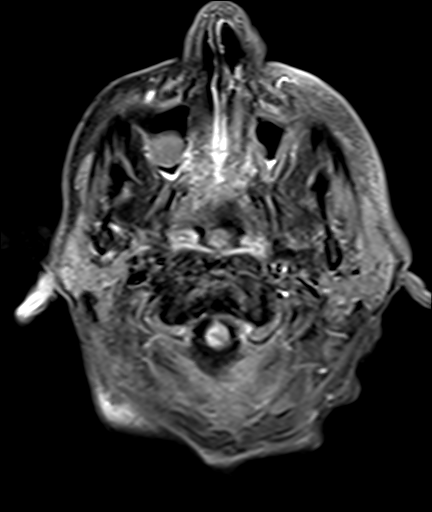
[im 25/25]
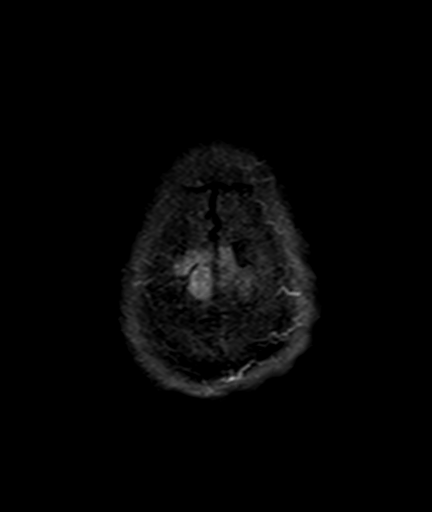

[Series 13: pha_images · axial · 3.0mm · 0.90mm/px · z∈[-63,+80]mm · 4 of 54 slices shown]
[im 1/54]
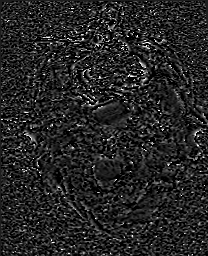
[im 18/54]
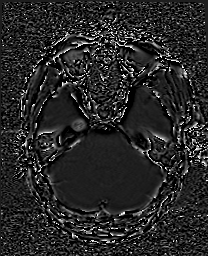
[im 36/54]
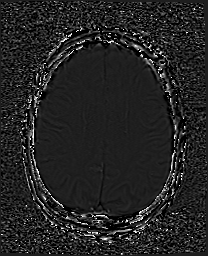
[im 54/54]
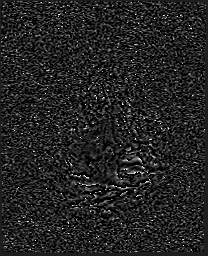

[Series 14: swi_images · axial · 3.0mm · 0.90mm/px · z∈[-63,+86]mm · 4 of 56 slices shown]
[im 1/56]
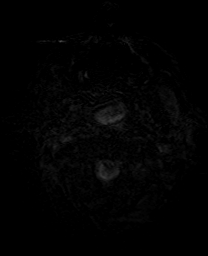
[im 19/56]
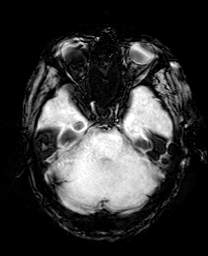
[im 37/56]
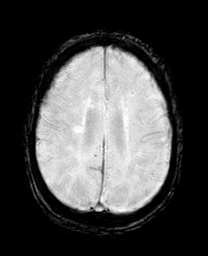
[im 56/56]
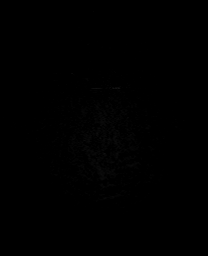

[Series 21: T2 post-contrast · coronal · 5.0mm · 0.72mm/px · 2 of 28 slices shown]
[im 1/28]
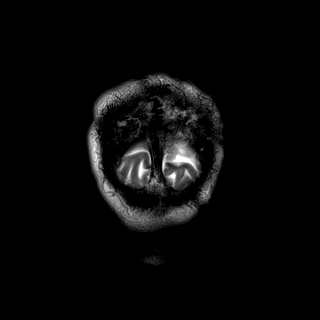
[im 28/28]
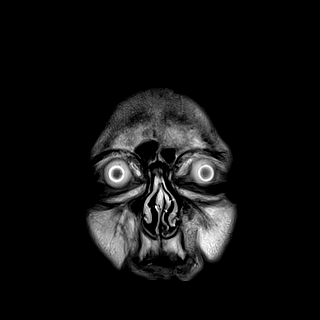

[Series 23: T1 post-contrast · coronal · 5.0mm · 0.34mm/px · 2 of 28 slices shown (1 of 2)]
[im 1/28]
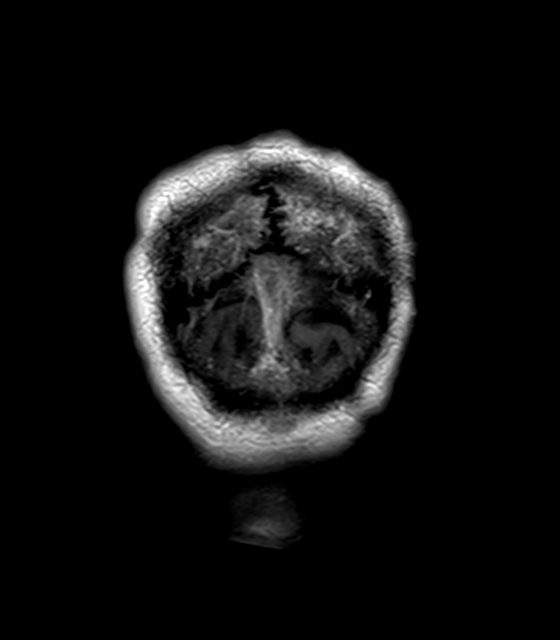
[im 28/28]
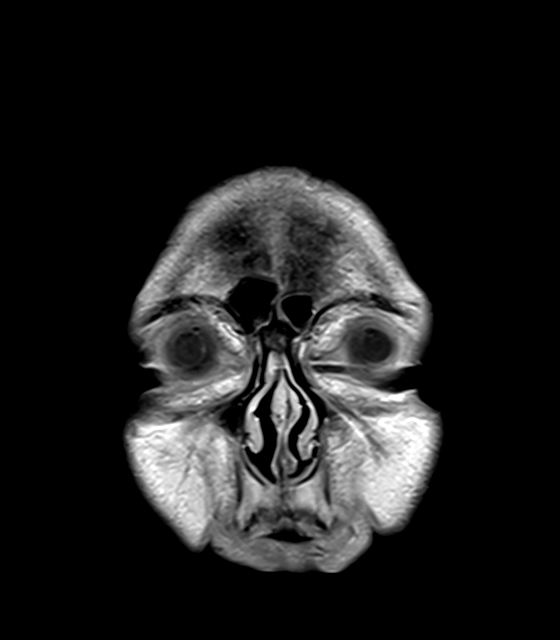

[Series 24: T1 post-contrast · sagittal · 5.0mm · 0.72mm/px · 2 of 23 slices shown (2 of 2)]
[im 1/23]
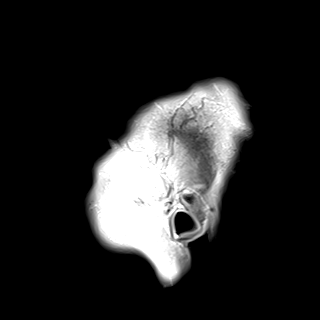
[im 23/23]
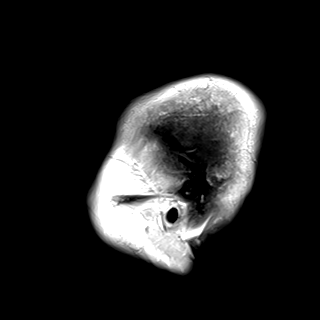

[39 of 48 positions shown; findings below may reference images not displayed]

FINDINGS: Brain: The study was done with the assistance of anesthesia. A good
quality study was obtained.

Mild atrophy. Negative for hydrocephalus. Chronic microvascular
ischemic change in the white matter and pons. Small areas of
facilitated diffusion in the frontal white matter bilaterally most
likely T2 shine through from subacute infarction. These were not
present on the prior study. Focal area of hemorrhage in the left
posterior temporal/parietal lobe which shows enhancement
postcontrast administration. This area of hemorrhage may be related
to infarction or infection but does not show restricted diffusion at
this point. This area of hemorrhage has progressed since prior MRI.
In addition, microhemorrhage is present in the right temporoparietal
lobes.

Postcontrast infusion, numerous small enhancing nodules are present
in the brain bilaterally. Most these measure approximately 3-5 mm
with the exception of the left posterior temporal enhancement which
is approximately 5 x 15 mm. Small enhancing nodules in the right
cerebellum, and scattered throughout both cerebral hemispheres. No
mass-effect or midline shift. These lesions do not demonstrate
significant edema. Prior MRI was done without contrast.

Vascular: Normal arterial flow voids.

Skull and upper cervical spine: Skull and skull base intact.

Bone marrow edema in the upper cervical spine at C2 and C3 with
large ventral epidural fluid collection. See MRI cervical spine
report for further detail.

Sinuses/Orbits: Bilateral mastoid effusion. Mucosal edema throughout
the paranasal sinuses with air-fluid level left maxillary sinus.

Other: None
IMPRESSION: 1. Multiple small enhancing nodules in the brain bilaterally. Given
history of bacteremia, these are likely septic emboli possibly with
infarction but no brain abscess. Hemorrhagic lesion left posterior
temporal lobe with mild enhancement likely also an area of septic
embolus.
2. Atrophy and chronic microvascular ischemia.  No acute infarct
3. Extensive sinusitis
4. Large ventral epidural fluid collection and cord compression at
C2-3. See MRI cervical spine report.

## 2019-09-08 IMAGING — MR MR THORACIC SPINE WO/W CM
1 series · 14 of 42 positions shown · IV contrast (Gadavist)
Comparison: None.

CLINICAL DATA: Bacteremia.  Back pain

EXAM:
MRI THORACIC WITHOUT AND WITH CONTRAST
TECHNIQUE: Multiplanar and multiecho pulse sequences of the thoracic spine were
obtained without and with intravenous contrast.
CONTRAST:  8mL GADAVIST GADOBUTROL 1 MMOL/ML IV SOLN

[Series 41: T1 post-contrast · axial · 5.0mm · 0.31mm/px · z∈[-527,-270]mm · 14 of 42 slices shown]
[im 1/42]
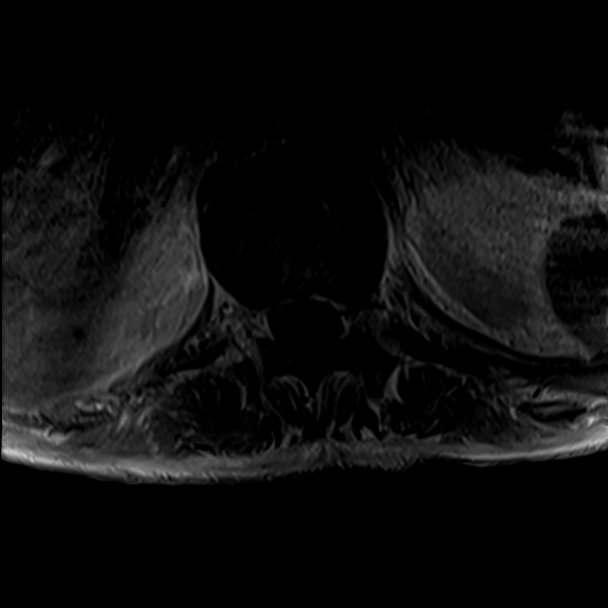
[im 2/42]
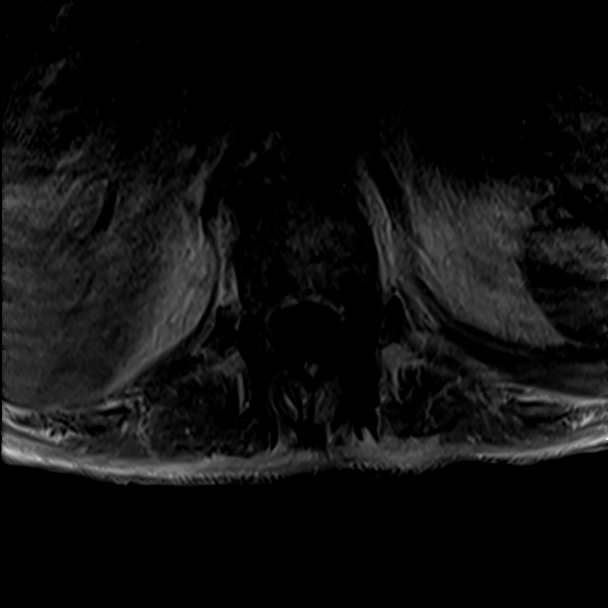
[im 3/42]
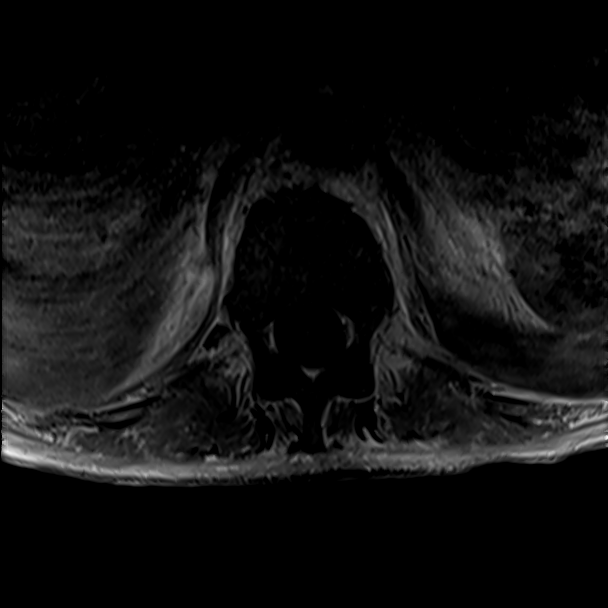
[im 4/42]
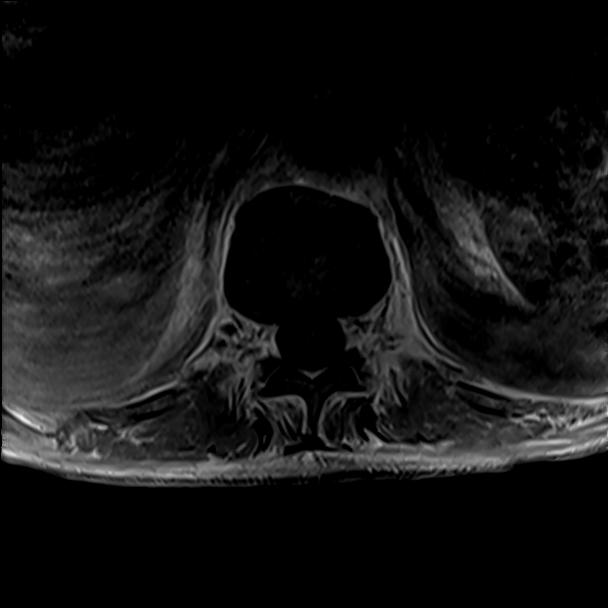
[im 5/42]
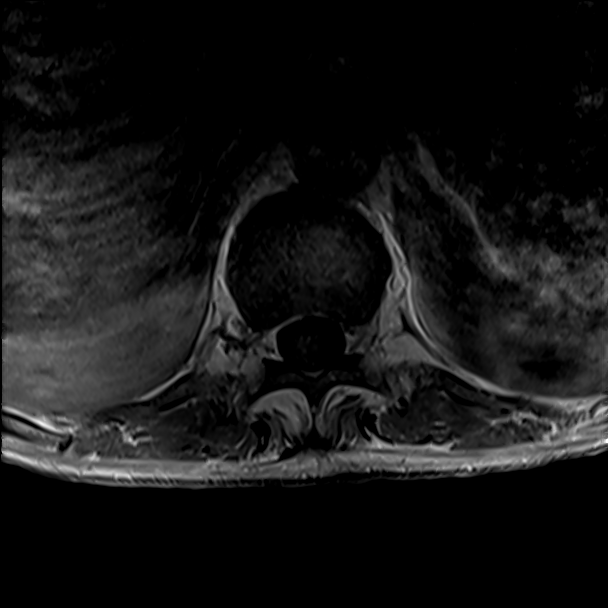
[im 8/42]
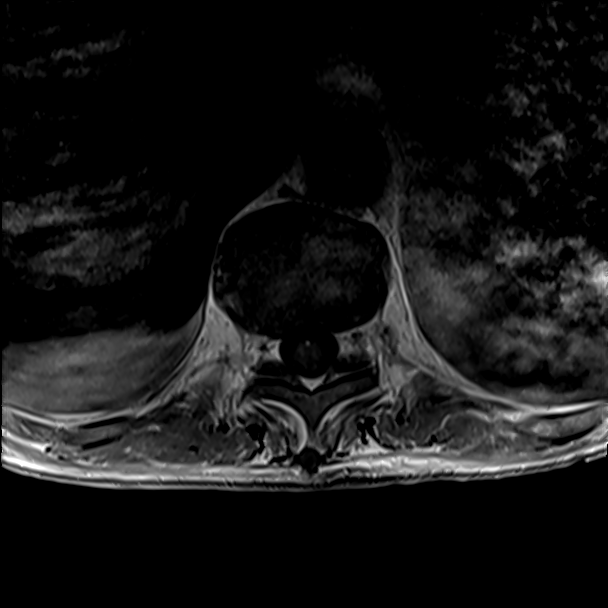
[im 14/42]
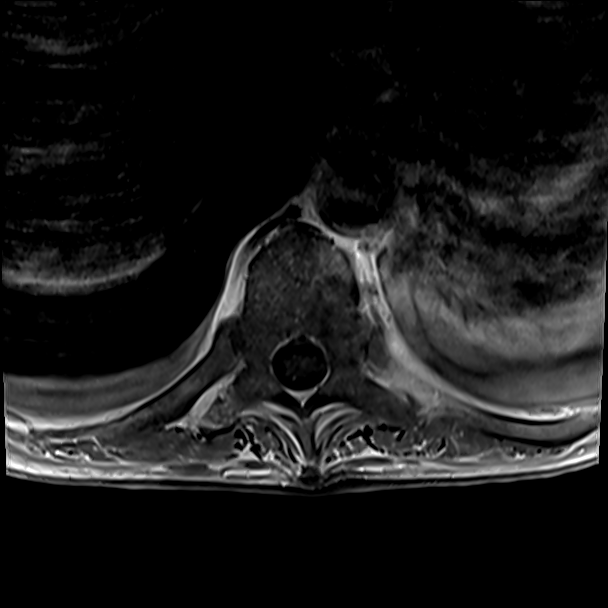
[im 19/42]
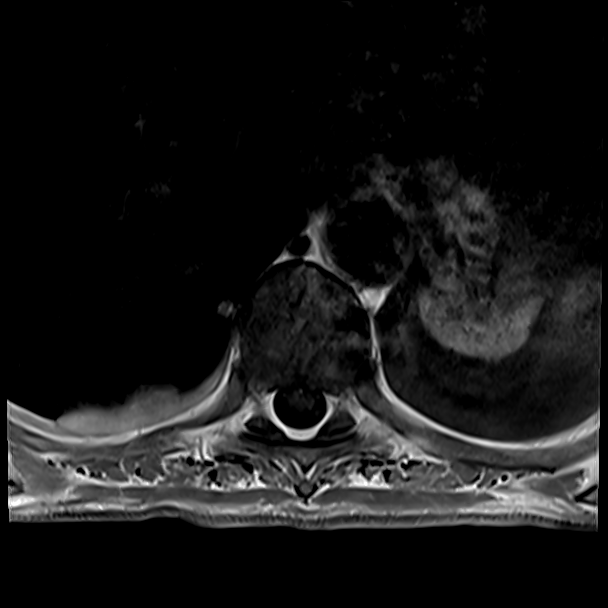
[im 22/42]
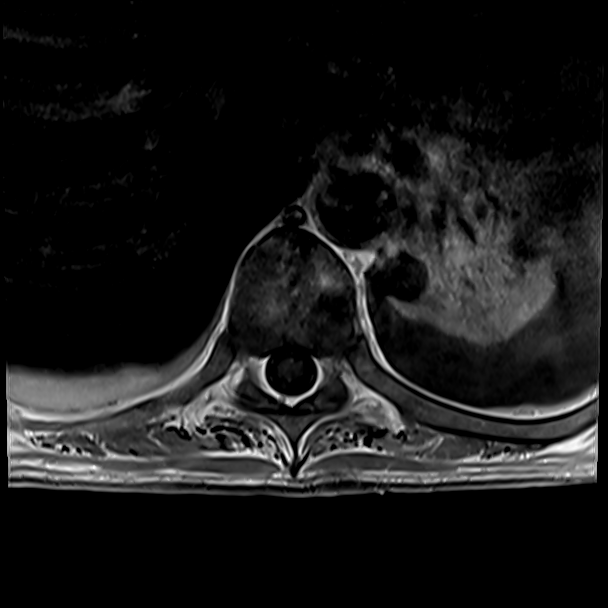
[im 24/42]
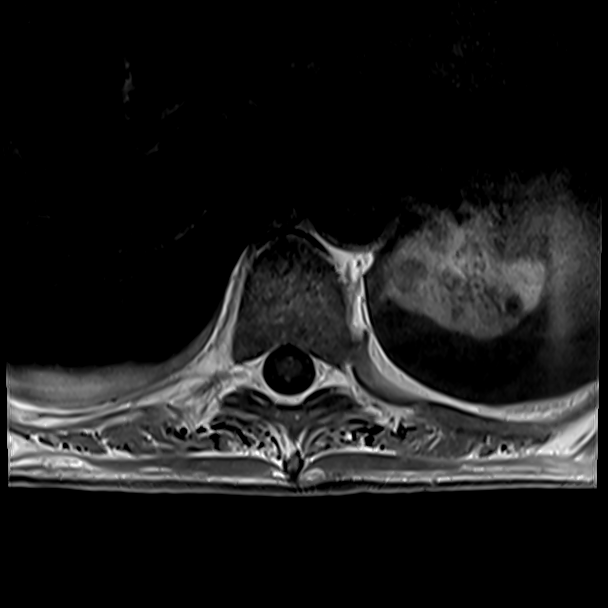
[im 29/42]
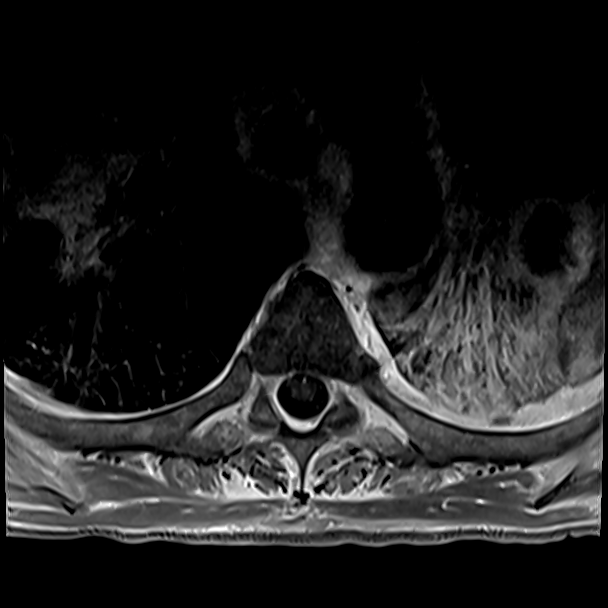
[im 35/42]
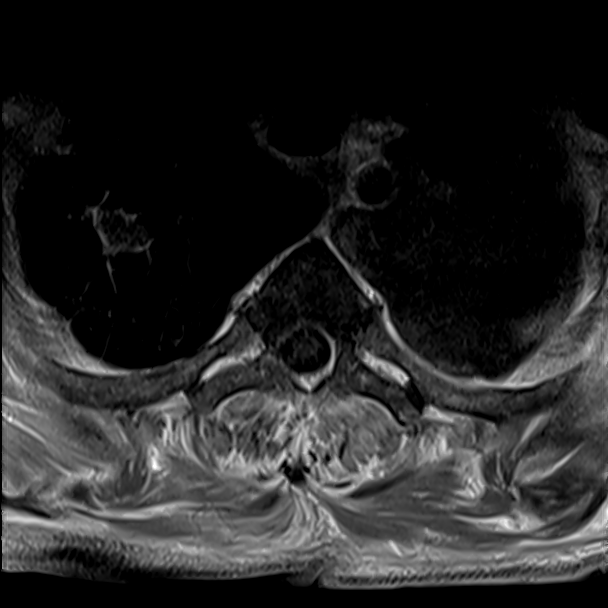
[im 36/42]
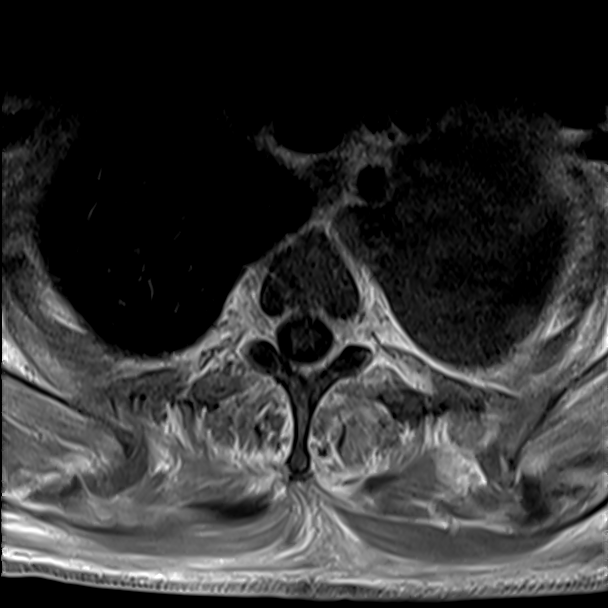
[im 40/42]
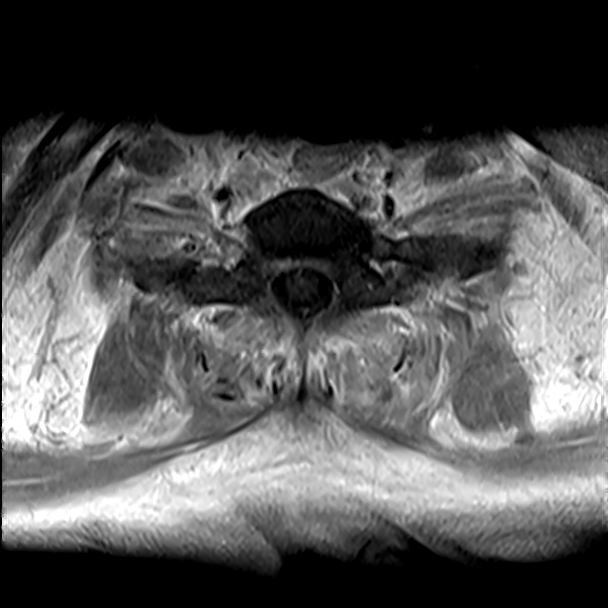

[14 of 42 positions shown; findings below may reference images not displayed]

FINDINGS: MRI THORACIC SPINE FINDINGS

Alignment:  Normal

Vertebrae: Negative for fracture or mass. No evidence of spinal
infection in the thoracic spine.

Cord:  Normal spinal cord signal.  No cord compression.

Paraspinal and other soft tissues: Moderately large loculated left
pleural effusion and small right pleural effusion. Multiple lung
nodules bilaterally. Prior chest CT also reported these nodules
which may represent septic emboli given history. Neoplasm could give
a similar appearance.

Soft tissue swelling and edema in the posterior soft tissues in the
upper back at approximately the C2-3 level. Small fluid collections
are present in the muscles at this level which could represent small
muscle abscesses. Right-sided fluid collection measures
approximately 12 x 24 mm. Left-sided fluid collection measures
approximately 8 x 15 mm.

Disc levels:

Mild thoracic degenerative changes. Abnormal disc spaces described
below.

T5-6: Small central disc protrusion

T6-7: Small central disc protrusion.

T8-9: Small central disc protrusion

T10-11: Small right-sided disc protrusion.
IMPRESSION: Negative for spinal infection in the thoracic spine. There are small
fluid collections within the posterior muscles at the C2-3 level
which could represent small soft tissue abscesses not extending into
the spinal canal

Mild thoracic disc degeneration.

Bilateral pleural effusions and bilateral lung nodules most likely
septic emboli given the history and prior imaging studies.

## 2019-09-08 SURGERY — MRI WITH ANESTHESIA
Anesthesia: General

## 2019-09-08 MED ORDER — POTASSIUM CHLORIDE CRYS ER 20 MEQ PO TBCR
40.0000 meq | EXTENDED_RELEASE_TABLET | Freq: Once | ORAL | Status: AC
  Administered 2019-09-08: 40 meq via ORAL
  Filled 2019-09-08: qty 2

## 2019-09-08 MED ORDER — SUGAMMADEX SODIUM 200 MG/2ML IV SOLN
INTRAVENOUS | Status: DC | PRN
  Administered 2019-09-08: 200 mg via INTRAVENOUS

## 2019-09-08 MED ORDER — ONDANSETRON HCL 4 MG/2ML IJ SOLN: 4.0000 mg | Freq: Once | INTRAMUSCULAR | Status: DC | PRN

## 2019-09-08 MED ORDER — LIDOCAINE 2% (20 MG/ML) 5 ML SYRINGE
INTRAMUSCULAR | Status: DC | PRN
  Administered 2019-09-08: 50 mg via INTRAVENOUS

## 2019-09-08 MED ORDER — ROCURONIUM BROMIDE 10 MG/ML (PF) SYRINGE
PREFILLED_SYRINGE | INTRAVENOUS | Status: DC | PRN
  Administered 2019-09-08: 20 mg via INTRAVENOUS
  Administered 2019-09-08: 50 mg via INTRAVENOUS

## 2019-09-08 MED ORDER — FENTANYL CITRATE (PF) 100 MCG/2ML IJ SOLN: 25.0000 ug | INTRAMUSCULAR | Status: DC | PRN

## 2019-09-08 MED ORDER — FENTANYL CITRATE (PF) 100 MCG/2ML IJ SOLN
INTRAMUSCULAR | Status: AC
  Filled 2019-09-08: qty 2

## 2019-09-08 MED ORDER — MORPHINE SULFATE (PF) 2 MG/ML IV SOLN
0.5000 mg | INTRAVENOUS | Status: DC | PRN
  Administered 2019-09-08 – 2019-09-09 (×2): 0.5 mg via INTRAVENOUS
  Filled 2019-09-08 (×2): qty 1

## 2019-09-08 MED ORDER — SUCCINYLCHOLINE CHLORIDE 20 MG/ML IJ SOLN
INTRAMUSCULAR | Status: DC | PRN
  Administered 2019-09-08: 80 mg via INTRAVENOUS

## 2019-09-08 MED ORDER — PHENYLEPHRINE 40 MCG/ML (10ML) SYRINGE FOR IV PUSH (FOR BLOOD PRESSURE SUPPORT)
PREFILLED_SYRINGE | INTRAVENOUS | Status: DC | PRN
  Administered 2019-09-08 (×2): 160 ug via INTRAVENOUS
  Administered 2019-09-08: 80 ug via INTRAVENOUS

## 2019-09-08 MED ORDER — PHENYLEPHRINE HCL-NACL 10-0.9 MG/250ML-% IV SOLN
INTRAVENOUS | Status: DC | PRN
  Administered 2019-09-08: 80 ug/min via INTRAVENOUS

## 2019-09-08 MED ORDER — EPHEDRINE SULFATE-NACL 50-0.9 MG/10ML-% IV SOSY
PREFILLED_SYRINGE | INTRAVENOUS | Status: DC | PRN
  Administered 2019-09-08: 10 mg via INTRAVENOUS
  Administered 2019-09-08 (×2): 15 mg via INTRAVENOUS

## 2019-09-08 MED ORDER — LACTATED RINGERS IV SOLN
INTRAVENOUS | Status: DC
  Administered 2019-09-08: 10:00:00 via INTRAVENOUS

## 2019-09-08 MED ORDER — FENTANYL CITRATE (PF) 250 MCG/5ML IJ SOLN
INTRAMUSCULAR | Status: DC | PRN
  Administered 2019-09-08: 100 ug via INTRAVENOUS

## 2019-09-08 MED ORDER — POTASSIUM CHLORIDE 10 MEQ/100ML IV SOLN
10.0000 meq | INTRAVENOUS | Status: AC
  Administered 2019-09-08 (×2): 10 meq via INTRAVENOUS
  Filled 2019-09-08: qty 100

## 2019-09-08 MED ORDER — PROPOFOL 10 MG/ML IV BOLUS
INTRAVENOUS | Status: DC | PRN
  Administered 2019-09-08: 100 mg via INTRAVENOUS

## 2019-09-08 MED ORDER — GADOBUTROL 1 MMOL/ML IV SOLN
8.0000 mL | Freq: Once | INTRAVENOUS | Status: AC | PRN
  Administered 2019-09-08: 8 mL via INTRAVENOUS

## 2019-09-08 NOTE — Progress Notes (Signed)
Pharmacy Antibiotic Note  Ryan Lane is a 68 y.o. male admitted on 08/28/2019 with MRSA bacteremia.  Pharmacy has been consulted for Vancomycin dosing.     The patient's Vancomycin dose was adjusted on 11/20 based on levels. Will plan to repeat levels tomorrow and adjust as needed. Noted imaging today showed epidural abscess with discitis/osteo.  Plan: Continue Vancomycin 1250 mg IV q12h Obtain a Vanc peak and trough on 11/24 Will plan to adjust the dose and OPAT as needed  Weight: 146 lb 2.6 oz (66.3 kg)  Temp (24hrs), Avg:97.7 F (36.5 C), Min:97.6 F (36.4 C), Max:97.8 F (36.6 C)  Recent Labs  Lab 09/02/19 0334 09/03/19 0340 09/04/19 0359 09/04/19 1902 09/05/19 0316 09/05/19 1218 09/06/19 0315 09/07/19 0624 09/08/19 0656  WBC 16.9* 15.9* 14.5*  --  11.8*  --  11.4* 11.1* 10.0  CREATININE 0.50* 0.47*  --   --   --  0.54*  --   --  0.46*  VANCOTROUGH  --   --   --   --  10*  --   --   --   --   VANCOPEAK  --   --   --  18*  --   --   --   --   --     CrCl cannot be calculated (Unknown ideal weight.).    No Known Allergies  Antimicrobials this admission: Vanc 11/12 >>  Dose adjustments: 11/15: VP=27, VT=13; AUC 464.1 - cont 1000 q12h 11/20: AUC 359--Inc Vancomycin 1250 IV q12h  Microbiology: 11/12 bcx - MRSA 11/12 UCx - ngF 11/16 BCx >> ngtd  Thank you for allowing pharmacy to be a part of this patient's care.  Alycia Rossetti, PharmD, BCPS Clinical Pharmacist Clinical phone for 09/08/2019: [phone] 09/08/2019 2:41 PM   **Pharmacist phone directory can now be found on [email or web address] ([email or web address]).  Listed under [place].

## 2019-09-08 NOTE — TOC Progression Note (Signed)
Transition of Care [facility]) - Progression Note    Patient Details  Name: Nohlan Burdin MRN: [id number] Date of Birth: Jun 04, 1951  Transition of Care [facility]) CM/SW [place], [place] Phone Number: 09/08/2019, 10:00 AM  Clinical Narrative:     CSW received a return phone call from the patient's son, Roselyn Reef. He selected [facility] for the patient.   CSW informed CSW Kathlee Nations.   Expected Discharge Plan: [place] Barriers to Discharge: Continued Medical Work up  Expected Discharge Plan and Services Expected Discharge Plan: [place] In-house Referral: Clinical Social Work Discharge Planning Services: NA Post Acute Care Choice: [place] Living arrangements for the past 2 months: Single Family Home                 DME Arranged: N/A DME Agency: NA       HH Arranged: NA HH Agency: NA         Social Determinants of Health (SDOH) Interventions    Readmission Risk Interventions No flowsheet data found.

## 2019-09-08 NOTE — Progress Notes (Addendum)
Daily Progress Note   Patient Name: Ryan Lane       Date: 09/08/2019 DOB: 04-Nov-1950  Age: 68 y.o. MRN#: [id number] Attending Physician: Kathlen Mody, MD Primary Care Physician: Patient, No Pcp Per Admit Date: 08/28/2019  Reason for Consultation/Follow-up: Establishing goals of care and Psychosocial/spiritual support   Discussed with Dr. Blake Divine, reviewed Epic MRI report, read NS notes.  Subjective: Patient smiles and winks at me.  He is responsive, but his phrases do not make sense.  He denies pain.     Assessment: 68 yo male with history of polysubstance abuse.  Current concern for inability to swallow or tolerate PT.  MRI today revealed large abscess C1 thru C3, osteomyelitis at C4-5 and C5-6 as well as a second epidural abscess at C5.   Patient Profile/HPI: 68 y.o. male  with past medical history of polysubstance abuse who was admitted from [facility] (where he was admitted on 11/7) on 08/28/2019 with MRSA bacteremia,  RML pneumonia, cerebellar stroke and persistent encephalopathy. TEE at [facility] was unrevealing.  He had a fall at [facility] and has a left psoas hematoma.  Imaging shows bilateral pulmonary densities which are described as cavitary in the RUL.  Cor Trak was placed for nutrition and the patient aspirated on tube feeds.   Length of Stay: 11  Current Medications: Scheduled Meds:  . aspirin  81 mg Oral Daily  . baclofen  5 mg Oral BID  . Chlorhexidine Gluconate Cloth  6 each Topical Daily  . feeding supplement (NEPRO CARB STEADY)  237 mL Oral BID BM  . feeding supplement (OSMOLITE 1.5 CAL)  1,000 mL Per Tube Q24H  . feeding supplement (PRO-STAT SUGAR FREE 64)  30 mL Per Tube BID  . folic acid  1 mg Oral Daily   Or  . folic acid  1 mg Intravenous  Daily  . hydrALAZINE  25 mg Oral Q8H  . LORazepam  1 mg Intravenous Once  . multivitamin with minerals  1 tablet Oral Daily  . sodium chloride flush  10-40 mL Intracatheter Q12H  . thiamine  100 mg Oral Daily   Or  . thiamine  100 mg Intravenous Daily    Continuous Infusions: . sodium chloride 1,000 mL (09/07/19 0957)  . lactated ringers Stopped (09/08/19 1319)  . vancomycin 1,250 mg (09/08/19  0954)    PRN Meds: sodium chloride, acetaminophen **OR** acetaminophen (TYLENOL) oral liquid 160 mg/5 mL **OR** acetaminophen, morphine injection, Resource ThickenUp Clear, sodium chloride flush  Physical Exam        Thin chronically ill appearing male, lying in bed, wakes to voice Speech is clear at time and jumbled at times. Resp NAD  Vital Signs: BP 135/78 (BP Location: Left Arm)   Pulse (!) 106   Temp 98.7 F (37.1 C) (Oral)   Resp 18   Wt 66.3 kg   SpO2 96%  SpO2: SpO2: 96 % O2 Device: O2 Device: Room Air O2 Flow Rate: O2 Flow Rate (L/min): 2 L/min  Intake/output summary:   Intake/Output Summary (Last 24 hours) at 09/08/2019 1922 Last data filed at 09/08/2019 1709 Gross per 24 hour  Intake 1529.42 ml  Output 650 ml  Net 879.42 ml   LBM: Last BM Date: 09/05/19 Baseline Weight: Weight: 61.8 kg Most recent weight: Weight: 66.3 kg       Palliative Assessment/Data: 20%    Flowsheet Rows     Most Recent Value  Intake Tab  Referral Department  Hospitalist  Unit at Time of Referral  Med/Surg Unit  Palliative Care Primary Diagnosis  Neurology  Date Notified  08/28/19  Palliative Care Type  New Palliative care  Reason for referral  Clarify Goals of Care  Date of Admission  08/28/19  Date first seen by Palliative Care  08/30/19  # of days Palliative referral response time  2 Day(s)  # of days IP prior to Palliative referral  0  Clinical Assessment  Psychosocial & Spiritual Assessment  Palliative Care Outcomes      Patient Active Problem List   Diagnosis Date  Noted  . Encounter for hospice care discussion   . Goals of care, counseling/discussion   . Palliative care by specialist   . Dyspnea and respiratory abnormalities   . Palliative care encounter   . MRSA bacteremia 08/28/2019  . Alcohol withdrawal ([place]) 08/28/2019  . Acute CVA (cerebrovascular accident) ([place]) 08/28/2019  . Hyponatremia 08/28/2019    Palliative Care Plan    Recommendations/Plan:  PMT will follow up with Nephew and Son on 11/24.  SLP and PT evaluations will provide valuable information for goals of care discussions  Goals of Care and Additional Recommendations:  Limitations on Scope of Treatment: Full Scope Treatment  Code Status:  DNR  Prognosis:   Unable to determine   Discharge Planning:  To Be Determined    Care plan was discussed with Dr. Karleen Hampshire       Thank you for allowing the Palliative Medicine Team to assist in the care of this patient.  Total time spent:  25 min     Greater than 50%  of this time was spent counseling and coordinating care related to the above assessment and plan.  Florentina Jenny, PA-C Palliative Medicine  Please contact Palliative MedicineTeam phone at [phone] for questions and concerns between 7 am - 7 pm.   Please see AMION for individual provider pager numbers.

## 2019-09-08 NOTE — Progress Notes (Addendum)
PT Cancellation Note  Patient Details Name: Matheu Ploeger MRN: [id number] DOB: January 24, 1951   Cancelled Treatment:    Reason Eval/Treat Not Completed: Patient at procedure or test/unavailable.  Per RN, Laverda Sorenson, pt is at MRI with sedation.  PT to check back this PM as time allows.    Thanks,  Verdene Lennert, PT, DPT  Acute Rehabilitation [phone] pager [phone] office  @ [facility]: [phone]     Harvie Heck 09/08/2019, 11:46 AM   09/08/2019 @1445 - RN found me and reported there were some C-spine changes on MRI and recommended holding until she paged MD (to see if neuro surgery consult is warranted).  She reports she cannot get him comfortable in the bed as he reports significant severe neck pain.  PT will await further follow up and check back tomorrow.  Thanks,  Verdene Lennert, PT, DPT  Acute Rehabilitation [phone] pager [phone] office  @ [facility]: [phone]

## 2019-09-08 NOTE — Progress Notes (Signed)
PROGRESS NOTE    Ryan Lane  DTO:[id number] DOB: [birth year] DOA: 08/28/2019 PCP: Patient, No Pcp Per   Brief Narrative: 68 year old male with prior history of alcohol abuse, hyponatremia was initially brought to [facility] on 7 November and found to have MRSA bacteremia and acute stroke involving the right cerebellar and right corona radiata.  He was transferred from [facility] to [facility] on November 12 for further evaluation and management.  He underwent a TEE which was negative for acute endocarditis.  Patient had persistent bacteremia with repeat blood cultures from 08/28/2019.  Blood cultures from 09/01/19 are negative so far.  Infectious disease is on board. Recommended 6 weeks of IV vancomycin for presumed endocarditis with CNS emboli and PICC line placement in the next 24 hours if cultures remain negative . The last date of IV Vancomycin is 12/28.  Discussed with the son and the patient regarding MRI under conscious sedation, they have agreed to get it done.  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  Baclofen ordered with minimal improvement.  Soft neck collar placed.  MRI of the cervical spine and lumbar spine along with Brain ordered today.  MRI brain showed septic emboli and focal hemorrhage in the temporal area.  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  [facility] Neuro surgery consult requested , but he was not deemed a candidate for surgery.  Discussed with the son over the phone of the above results.   Assessment & Plan:   Principal Problem:   MRSA bacteremia Active Problems:   Alcohol withdrawal ([place])   Acute CVA (cerebrovascular accident) ([place])   Hyponatremia   Dyspnea and respiratory abnormalities   Palliative care encounter   Encounter for hospice care discussion   Goals of care, counseling/discussion   Palliative care by  specialist  Sepsis secondary to MRSA bacteremia and pneumonia Persistent MRSA bacteremia on blood cultures from 08/28/2019 and negative blood cultures from 09/01/2019.  Patient is currently on IV vancomycin , ID consulted and recommended a totalof 6 weeks of treatment for presumed endocarditis and CNS emboli. Further evaluation of the source of persistent bacteremia with an MRI of the cervical and thoracic spine ordered but unfortunately could not be done as patient could not lay flat for the study to be performed. It is ordered to be done under conscious sedation after discussing with the patient and his son over the phone.  PICC line placed and plan for 6 weeks of IV antibiotic therapy.  MRI brain , cervical spine and lumbar spine done under sedation.  MRI brain showed septic emboli and focal hemorrhage in the temporal area.  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  Stat Neuro surgery consult requested , but he was not deemed a candidate for surgery.     MRSA Pneumonia:  Weaned him off oxygen, pt on RA with sats greater than 90% Repeat CXR shows Marked decrease in the large left pleural effusion. Persistent moderate left effusion. New ill-defined infiltrate at the right lung base. Multiple cavitary lesions in both lungs.     Acute toxic/metabolic encephalopathy in the setting of MRSA bacteremia and pneumonia, stroke.   Neurology on board recommended an MRI of the brain but could not be done as patient could not lay flat and tolerated.  An EEG did not show any epileptiform findings.  Another differential could be Wernicke's encephalopathy. Pt is currently awake and answering questions appropriately.  He continues to have neck pain and stiffness , with slight torticollis to the left side, probably ? Cervical dystonia.    ? Cervical dystonia: pain in the neck associated with  stiffness.  - baclofen ordered with minimal improvement. Soft neck collar ordered.   Cerebellar CVA MRI from [facility] showed punctate right cerebellar and right corona radiata. Neurology is on board and suggested no indication of LP as it would not change the management of MRSA bacteremia and CNS emboli.     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   Left psoas intramuscular hematoma Patient's hemoglobin continues to drop from 11.6-11 0.1-9 0.4-8.8 to 8.3- 8.5- 8.4 and stable.  Aspirin restarted as the hematoma is stable.  Unable to take any oral meds. Pt reports food sticking at the back of the throat, with pain, unable to swallow, will make him NPO, request SLP eval in am.    Hyponatremia Improved to 133.   Dysphagia Unable to take any oral meds. Pt reports food sticking at the back of the throat, with pain, unable to swallow, will make him NPO, request SLP eval in am  Hypokalemia Replaced  History of alcohol abuse No signs of withdrawal at this time.  Mildly elevated liver enzymes Resolved.  .  Patient denies any nausea or vomiting at this time.   Anemia of chronic disease and anemia of blood loss from the hematoma:  Transfuse to keep hemoglobin greater than 7.  Hemoglobin stable around 8.3.   Essential hypertension:  Well controlled.     In view of poor functional status, clinical deterioration, persistent MRSA bacteremia and fevers palliative care consulted and is on board with discussions with the family.  He is currently DNR and further recommendations to follow.   DVT prophylaxis: SCDs Code Status: DNR Family Communication: discussed with son over the phone.  Disposition Plan: pending palcement, not safe for discharge home.    Consultants:   Infectious disease  Neurology  Procedures: CT of the abdomen pelvis on admission Antimicrobials:  IV vancomycin  Subjective: Pt unable to swallow. Weaned him off oxygen.   No chest pain or sob.   Objective: Vitals:   09/08/19 1331 09/08/19 1345 09/08/19 1346 09/08/19 1454  BP: (!) 146/76 122/64 122/64 132/80  Pulse: (!) 107 (!) 103 (!) 103   Resp: (!) 32  (!) 27   Temp:   97.8 F (36.6 C)   TempSrc:      SpO2: 93% 92% 92%   Weight:        Intake/Output Summary (Last 24 hours) at 09/08/2019 1501 Last data filed at 09/08/2019 1346 Gross per 24 hour  Intake 1100 ml  Output 1200 ml  Net -100 ml   Filed Weights   09/04/19 0332 09/05/19 0451 09/08/19 0500  Weight: 67.5 kg 68.4 kg 66.3 kg    Examination:  General exam: appears uncomfortable.  Respiratory system: air entry fair, no wheezing or rhonchi.  Cardiovascular system: S1S2, tachycardia.  Gastrointestinal system:  Abdomen is soft, NT ND BS+ Central nervous system:  ALERT  And oriented to place and person, answering simple yes and No to questions.  Extremities:no cyanosis.  Skin: no rashes.  Psychiatry: mood is appropriate.      Data Reviewed: I have personally reviewed following labs and imaging studies  CBC: Recent Labs  Lab 09/04/19  [id number] 09/05/19 0316 09/06/19 0315 09/07/19 0624 09/08/19 0656  WBC 14.5* 11.8* 11.4* 11.1* 10.0  NEUTROABS 11.5* 9.2* 8.7* 8.3* 7.0  HGB 8.3* 8.5* 8.6* 8.4* 8.3*  HCT 24.4* 24.6* 25.3* 25.0* 24.3*  MCV 95.3 92.8 93.4 95.4 93.5  PLT 457* 565* 630* 620* 672*   Basic Metabolic Panel: Recent Labs  Lab 09/02/19 0334 09/03/19 0340 09/05/19 1218 09/08/19 0656  NA 139 135 131* 133*  K 3.7 3.5 3.7 3.2*  CL 105 102 94* 96*  CO2 26 27 28 30   GLUCOSE 106* 110* 96 89  BUN 10 8 6* <5*  CREATININE 0.50* 0.47* 0.54* 0.46*  CALCIUM 7.7* 7.7* 7.9* 8.1*   GFR: CrCl cannot be calculated (Unknown ideal weight.). Liver Function Tests: Recent Labs  Lab 09/02/19 0334 09/03/19 0340 09/05/19 1218  AST 100* 61* 36  ALT 61* 49* 33  ALKPHOS 81 64 69  BILITOT 0.7 0.5 1.0  PROT 5.4* 5.3* 6.1*  ALBUMIN 1.4* 1.3* 1.3*   No results for input(s):  LIPASE, AMYLASE in the last 168 hours. No results for input(s): AMMONIA in the last 168 hours. Coagulation Profile: No results for input(s): INR, PROTIME in the last 168 hours. Cardiac Enzymes: No results for input(s): CKTOTAL, CKMB, CKMBINDEX, TROPONINI in the last 168 hours. BNP (last 3 results) No results for input(s): PROBNP in the last 8760 hours. HbA1C: No results for input(s): HGBA1C in the last 72 hours. CBG: Recent Labs  Lab 09/07/19 1733 09/07/19 1943 09/07/19 2318 09/08/19 0349 09/08/19 0756  GLUCAP 93 119* 94 91 83   Lipid Profile: No results for input(s): CHOL, HDL, LDLCALC, TRIG, CHOLHDL, LDLDIRECT in the last 72 hours. Thyroid Function Tests: No results for input(s): TSH, T4TOTAL, FREET4, T3FREE, THYROIDAB in the last 72 hours. Anemia Panel: No results for input(s): VITAMINB12, FOLATE, FERRITIN, TIBC, IRON, RETICCTPCT in the last 72 hours. Sepsis Labs: No results for input(s): PROCALCITON, LATICACIDVEN in the last 168 hours.  Recent Results (from the past 240 hour(s))  Culture, blood (routine x 2)     Status: None   Collection Time: 09/01/19  9:03 AM   Specimen: BLOOD  Result Value Ref Range Status   Specimen Description BLOOD RIGHT ANTECUBITAL  Final   Special Requests   Final    BOTTLES DRAWN AEROBIC AND ANAEROBIC Blood Culture adequate volume   Culture   Final    NO GROWTH 5 DAYS Performed at [facility], [address] [place], [place] [address]    Report Status 09/06/2019 FINAL  Final  Culture, blood (routine x 2)     Status: None   Collection Time: 09/01/19  9:08 AM   Specimen: BLOOD LEFT HAND  Result Value Ref Range Status   Specimen Description BLOOD LEFT HAND  Final   Special Requests AEROBIC BOTTLE ONLY Blood Culture adequate volume  Final   Culture   Final    NO GROWTH 5 DAYS Performed at [facility], [address] [place], [place] [address]    Report Status 09/06/2019 FINAL  Final  MRSA PCR Screening     Status: Abnormal    Collection Time: 09/06/19  6:11 AM   Specimen: Nasopharyngeal  Result Value Ref Range Status   MRSA by PCR POSITIVE (A) NEGATIVE Final    Comment:        The GeneXpert MRSA Assay (FDA approved for NASAL specimens only), is one component of a comprehensive MRSA colonization surveillance program. It is not intended to diagnose MRSA  infection nor to guide or monitor treatment for MRSA infections. RESULT CALLED TO, READ BACK BY AND VERIFIED WITH: RN Alan Ripper THOMPSON [phone] FCP Performed at [facility], [address], [place], [place] [address]   SARS CORONAVIRUS 2 (TAT 6-24 HRS) Nasopharyngeal Nasopharyngeal Swab     Status: None   Collection Time: 09/07/19  2:31 PM   Specimen: Nasopharyngeal Swab  Result Value Ref Range Status   SARS Coronavirus 2 NEGATIVE NEGATIVE Final    Comment: (NOTE) SARS-CoV-2 target nucleic acids are NOT DETECTED. The SARS-CoV-2 RNA is generally detectable in upper and lower respiratory specimens during the acute phase of infection. Negative results do not preclude SARS-CoV-2 infection, do not rule out co-infections with other pathogens, and should not be used as the sole basis for treatment or other patient management decisions. Negative results must be combined with clinical observations, patient history, and epidemiological information. The expected result is Negative. Fact Sheet for Patients: HairSlick.no Fact Sheet for Healthcare Providers: quierodirigir.com This test is not yet approved or cleared by the [place] FDA and  has been authorized for detection and/or diagnosis of SARS-CoV-2 by FDA under an Emergency Use Authorization (EUA). This EUA will remain  in effect (meaning this test can be used) for the duration of the COVID-19 declaration under Section 56 4(b)(1) of the Act, 21 U.S.C. section 360bbb-3(b)(1), unless the authorization is terminated or revoked  sooner. Performed at [facility], [address] [place], [place] [address]          Radiology Studies: Mr Laqueta Jean GN Contrast  Result Date: 09/08/2019 CLINICAL DATA:  Encephalopathy.  Fever, bacteremia. EXAM: MRI HEAD WITHOUT AND WITH CONTRAST TECHNIQUE: Multiplanar, multiecho pulse sequences of the brain and surrounding structures were obtained without and with intravenous contrast. CONTRAST:  8mL GADAVIST GADOBUTROL 1 MMOL/ML IV SOLN COMPARISON:  MRI head 08/26/2019 FINDINGS: Brain: The study was done with the assistance of anesthesia. A good quality study was obtained. Mild atrophy. Negative for hydrocephalus. Chronic microvascular ischemic change in the white matter and pons. Small areas of facilitated diffusion in the frontal white matter bilaterally most likely T2 shine through from subacute infarction. These were not present on the prior study. Focal area of hemorrhage in the left posterior temporal/parietal lobe which shows enhancement postcontrast administration. This area of hemorrhage may be related to infarction or infection but does not show restricted diffusion at this point. This area of hemorrhage has progressed since prior MRI. In addition, microhemorrhage is present in the right temporoparietal lobes. Postcontrast infusion, numerous small enhancing nodules are present in the brain bilaterally. Most these measure approximately 3-5 mm with the exception of the left posterior temporal enhancement which is approximately 5 x 15 mm. Small enhancing nodules in the right cerebellum, and scattered throughout both cerebral hemispheres. No mass-effect or midline shift. These lesions do not demonstrate significant edema. Prior MRI was done without contrast. Vascular: Normal arterial flow voids. Skull and upper cervical spine: Skull and skull base intact. Bone marrow edema in the upper cervical spine at C2 and C3 with large ventral epidural fluid collection. See MRI cervical spine report  for further detail. Sinuses/Orbits: Bilateral mastoid effusion. Mucosal edema throughout the paranasal sinuses with air-fluid level left maxillary sinus. Other: None IMPRESSION: 1. Multiple small enhancing nodules in the brain bilaterally. Given history of bacteremia, these are likely septic emboli possibly with infarction but no brain abscess. Hemorrhagic lesion left posterior temporal lobe with mild enhancement likely also an area  of septic embolus. 2. Atrophy and chronic microvascular ischemia.  No acute infarct 3. Extensive sinusitis 4. Large ventral epidural fluid collection and cord compression at C2-3. See MRI cervical spine report. Electronically Signed   By: Marlan Palau M.D.   On: 09/08/2019 14:16   [name] Contrast  Result Date: 09/08/2019 CLINICAL DATA:  Fever delirium back pain. Bacteremia. EXAM: MRI CERVICAL SPINE WITHOUT AND WITH CONTRAST TECHNIQUE: Multiplanar and multiecho pulse sequences of the cervical spine, to include the craniocervical junction and cervicothoracic junction, were obtained without and with intravenous contrast. CONTRAST:  83mL GADAVIST GADOBUTROL 1 MMOL/ML IV SOLN COMPARISON:  None. FINDINGS: Alignment: The study was performed with assistance of anesthesia. Normal alignment Vertebrae: Bone marrow edema and enhancement C4, C5, and C6. Negative for fracture. Cord: Multilevel spinal stenosis. Cord compression at C2-3 due to large ventral epidural fluid collection compatible with abscess. No cord signal abnormality. Posterior Fossa, vertebral arteries, paraspinal tissues: The patient is intubated with secretions in the pharynx. Disc levels: Large ventral epidural fluid collection with rim enhancement extending from C1 through C3-4. The fluid collection measures approximately 8 x 25 mm in transverse dimension and extends over 5.5 cm. This is causing posterior displacement of the cord with cord compression. Bone marrow edema and disc space edema at C4-5 and C5-6  are most consistent with discitis and osteomyelitis. There is diffuse dural thickening in the cervical spine due to infection. C1-2: Degenerative change. Ventral epidural abscess extends to this level causing posterior displacement of the cord and cord compression C2-3: Ventral epidural abscess with cord compression and moderate to severe spinal stenosis. C3-4: Disc degeneration and spurring. Mild spinal stenosis. Ventral epidural abscess terminates at this level. Neural foramina patent bilaterally C4-5: Bone marrow edema enhancement compatible with discitis/osteomyelitis. Circumferential dural enhancement and moderate spinal stenosis. C5-6: Bone marrow edema and enhancement compatible with discitis osteomyelitis. Prominent ventral epidural enhancement with posterior displacement of the cord and moderate spinal stenosis. Ventral epidural fluid collection measuring approximately 4 x 10 mm which appears to be arising from the disc space may represent additional epidural abscess behind the C5 vertebral body. C6-7: Disc degeneration and spondylosis. 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 Smaller 4 x 10 mm ventral epidural abscess at C5 with moderate spinal stenosis. Diffuse dural thickening in the cervical spine due to infection. These results were called by telephone at the time of interpretation on 09/08/2019 at 2:26 pm to provider [facility] , who verbally acknowledged these results. Electronically Signed   By: Marlan Palau M.D.   On: 09/08/2019 14:28   Mr Thoracic Spine W Wo Contrast  Result Date: 09/08/2019 CLINICAL DATA:  Bacteremia.  Back pain EXAM: MRI THORACIC WITHOUT AND WITH CONTRAST TECHNIQUE: Multiplanar and multiecho pulse sequences of the thoracic spine were obtained without and with intravenous  contrast. CONTRAST:  47mL GADAVIST GADOBUTROL 1 MMOL/ML IV SOLN COMPARISON:  None. FINDINGS: MRI THORACIC SPINE FINDINGS Alignment:  Normal Vertebrae: Negative for fracture or mass. No evidence of spinal infection in the thoracic spine. Cord:  Normal spinal cord signal.  No cord compression. Paraspinal and other soft tissues: Moderately large loculated left pleural effusion and small right pleural effusion. Multiple lung nodules bilaterally. Prior chest CT also reported these nodules which may represent septic emboli given history. Neoplasm could give a similar appearance. Soft  tissue swelling and edema in the posterior soft tissues in the upper back at approximately the C2-3 level. Small fluid collections are present in the muscles at this level which could represent small muscle abscesses. Right-sided fluid collection measures approximately 12 x 24 mm. Left-sided fluid collection measures approximately 8 x 15 mm. Disc levels: Mild thoracic degenerative changes. Abnormal disc spaces described below. T5-6: Small central disc protrusion T6-7: Small central disc protrusion. T8-9: Small central disc protrusion T10-11: Small right-sided disc protrusion. IMPRESSION: Negative for spinal infection in the thoracic spine. There are small fluid collections within the posterior muscles at the C2-3 level which could represent small soft tissue abscesses not extending into the spinal canal Mild thoracic disc degeneration. Bilateral pleural effusions and bilateral lung nodules most likely septic emboli given the history and prior imaging studies. Electronically Signed   By: Marlan Palau M.D.   On: 09/08/2019 14:37        Scheduled Meds:  aspirin  81 mg Oral Daily   baclofen  5 mg Oral BID   Chlorhexidine Gluconate Cloth  6 each Topical Daily   feeding supplement (NEPRO CARB STEADY)  237 mL Oral BID BM   feeding supplement (OSMOLITE 1.5 CAL)  1,000 mL Per Tube Q24H   feeding supplement (PRO-STAT SUGAR FREE 64)   30 mL Per Tube BID   folic acid  1 mg Oral Daily   Or   folic acid  1 mg Intravenous Daily   hydrALAZINE  25 mg Oral Q8H   LORazepam  1 mg Intravenous Once   multivitamin with minerals  1 tablet Oral Daily   sodium chloride flush  10-40 mL Intracatheter Q12H   thiamine  100 mg Oral Daily   Or   thiamine  100 mg Intravenous Daily   Continuous Infusions:  sodium chloride 1,000 mL (09/07/19 0957)   lactated ringers Stopped (09/08/19 1319)   vancomycin 1,250 mg (09/08/19 0954)     LOS: 11 days        Kathlen Mody, MD [facility] 09/08/2019, 3:01 PM

## 2019-09-08 NOTE — Progress Notes (Signed)
SLP Cancellation Note  Patient Details Name: Ryan Lane MRN: [id number] DOB: 06-27-1951   Cancelled treatment:        Attempted to see pt for ongoing dysphagia and cognitive-linguistic therapy.  Pt off floor at time of SLP attempt, in OR under sedation.  SLP will reattempt as schedule allows.   Celedonio Savage , MA, [facility] Office: [phone]; Pager (11/23): [phone] (by text page) 09/08/2019, 10:29 AM

## 2019-09-08 NOTE — Transfer of Care (Signed)
Immediate Anesthesia Transfer of Care Note  Patient: Ryan Lane  Procedure(s) Performed: MRI WITH ANESTHESIA (N/A )  Patient Location: PACU  Anesthesia Type:General  Level of Consciousness: awake  Airway & Oxygen Therapy: Patient Spontanous Breathing and Patient connected to nasal cannula oxygen  Post-op Assessment: Report given to RN and Post -op Vital signs reviewed and stable  Post vital signs: Reviewed and stable  Last Vitals:  Vitals Value Taken Time  BP    Temp    Pulse    Resp    SpO2 92%     Last Pain:  Vitals:   09/08/19 0855  TempSrc:   PainSc: 0-No pain         Complications: No apparent anesthesia complications

## 2019-09-08 NOTE — Plan of Care (Signed)
  Problem: Self-Care: Goal: Ability to participate in self-care as condition permits will improve Outcome: Not Progressing   

## 2019-09-08 NOTE — Anesthesia Postprocedure Evaluation (Signed)
Anesthesia Post Note  Patient: Ryan Lane  Procedure(s) Performed: MRI WITH ANESTHESIA (N/A )     Patient location during evaluation: PACU Anesthesia Type: General Level of consciousness: awake Pain management: pain level controlled Vital Signs Assessment: post-procedure vital signs reviewed and stable Respiratory status: spontaneous breathing, nonlabored ventilation, respiratory function stable and patient connected to nasal cannula oxygen Cardiovascular status: blood pressure returned to baseline and stable Postop Assessment: no apparent nausea or vomiting Anesthetic complications: no    Last Vitals:  Vitals:   09/08/19 1705 09/08/19 1944  BP: 135/78 139/75  Pulse: (!) 106 (!) 108  Resp: 18 (!) 24  Temp: 37.1 C 36.8 C  SpO2: 96% 96%    Last Pain:  Vitals:   09/08/19 1944  TempSrc: Oral  PainSc:                  Ryan P Ellender

## 2019-09-08 NOTE — Progress Notes (Signed)
RN attempted to administer pt's medication , but pt was not able to  take his medications. Pt states that he felt that the  Medication as  "being stuck" and not able to get them down even though the medications were crushed in apple sauce. Pt felt his swallowing was worst today. MD made ware. Pt made NPO for safety.

## 2019-09-08 NOTE — Anesthesia Procedure Notes (Signed)
Procedure Name: Intubation Performed by: Milford Cage, CRNA Pre-anesthesia Checklist: Patient identified, Emergency Drugs available, Suction available and Patient being monitored Patient Re-evaluated:Patient Re-evaluated prior to induction Oxygen Delivery Method: Circle System Utilized Preoxygenation: Pre-oxygenation with 100% oxygen Induction Type: IV induction and Rapid sequence Laryngoscope Size: Miller and 3 Grade View: Grade II Tube type: Oral Number of attempts: 1 Airway Equipment and Method: Stylet and Oral airway Placement Confirmation: ETT inserted through vocal cords under direct vision,  positive ETCO2 and breath sounds checked- equal and bilateral Secured at: 24 cm Tube secured with: Tape Dental Injury: Teeth and Oropharynx as per pre-operative assessment

## 2019-09-08 NOTE — Consult Note (Signed)
Reason for Consult: Cervical osteomyelitis/epidural abscess Referring Physician: Hospitalist  Ryan Lane is an 68 y.o. male.   HPI:  68 year old male with multiple medical problems including history of alcoholism who was admitted 08/28/2019 with MRSA bacteremia and encephalopathy.  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  Neurology and infectious disease were consulted.  He was started on vancomycin.  I have spoken with the nurse at length.  The patient can give some history.  I reviewed the chart extensively.  The patient complains of neck stiffness and some neck pain.  He has trouble lifting the right arm off of the bed and states it has been that way "for a month or more."  Neurology note from 09/03/2019 physical exam seems to confirm this.  Neurology at that time recommended MRI of the brain cervical spine and thoracic spine and asked to be called back when those scans were completed.  He had those scans done today under anesthesia.  The nurse states he is somewhat better today than he has been, more alert and awake and talkative.  Patient is DNR and palliative care has been consulted.  Nursing home FL 2 has been completed.  History reviewed. No pertinent past medical history.  Past Surgical History:  Procedure Laterality Date  . BUBBLE STUDY  09/02/2019   Procedure: BUBBLE STUDY;  Surgeon: Chrystie NoseHilty, Kenneth C, MD;  Location: [facility] ENDOSCOPY;  Service: Cardiovascular;;  . TEE WITHOUT CARDIOVERSION N/A 09/02/2019   Procedure: TRANSESOPHAGEAL ECHOCARDIOGRAM (TEE);  Surgeon: Chrystie NoseHilty, Kenneth C, MD;  [facility];  Service: Cardiovascular;  Laterality: N/A;    No Known Allergies  Social History   Tobacco Use  . Smoking status: Current Every Day Smoker  . Smokeless tobacco: Never Used  Substance Use Topics  . Alcohol use: Not on file    Family History  Family history unknown: Yes     Review of Systems  Positive ROS: Unable to obtain  All other systems have been  reviewed and were otherwise negative with the exception of those mentioned in the HPI and as above.  Objective: Vital signs in last 24 hours: Temp:  [97.6 F (36.4 C)-97.8 F (36.6 C)] 97.8 F (36.6 C) (11/23 1346) Pulse Rate:  [102-107] 103 (11/23 1346) Resp:  [16-32] 27 (11/23 1346) BP: (122-146)/(64-93) 132/80 (11/23 1454) SpO2:  [92 %-95 %] 95 % (11/23 1450) Weight:  [66.3 kg] 66.3 kg (11/23 0500)  General Appearance: Unkempt male lying in the hospital bed with soft cervical collar, no distress Head: Normocephalic, without obvious abnormality, atraumatic Eyes: PERRL, conjunctiva/corneas clear, EOM's intact    Neck: Quite stiff and any manipulation causes discomfort Back: Symmetric, no curvature, ROM normal, no CVA tenderness Lungs:  respirations unlabored Heart: Regular rate and rhythm Abdomen: Soft Extremities: Extremities normal, atraumatic, no cyanosis or edema Pulses: 2+ and symmetric all extremities Skin: Skin color, texture, turgor normal  NEUROLOGIC:   Mental status: Keeps eyes closed but is arousable and will open eyes to voice, no apparent aphasia, decreased attention span, Memory and fund of knowledge difficult to fully assess Motor Exam - grossly normal, normal tone and bulk to in bed exam with difficulty lifting the arms off the bed though he is antigravity in the left shoulder girdle but not in the right, grips are equal and 4 to 4+ out of 5, biceps are 4 out of 5, triceps 4 out of 5, difficult to keep him awake for detailed motor exam, lifts legs off the bed with ease,  exam matches the neurology motor exam from 09/03/2019 except for today he seems to be able lift the left arm off the bed against gravity at the shoulder Sensory Exam - grossly normal as best I can tell Reflexes: symmetric, no pathologic reflexes, No Hoffman's, No clonus Coordination -difficult to fully assess Gait -not assessed Balance -not assessed Cranial Nerves: I: smell Not tested  II: visual  acuity  OS: na    OD: na  II: visual fields Full to confrontation  II: pupils Equal, round, reactive to light  III,VII: ptosis None  III,IV,VI: extraocular muscles  Full ROM  V: mastication Normal  V: facial light touch sensation  Normal  V,VII: corneal reflex  Present  VII: facial muscle function - upper  Normal  VII: facial muscle function - lower Normal  VIII: hearing Not tested  IX: soft palate elevation  Normal  IX,X: gag reflex Present  XI: trapezius strength  5/5  XI: sternocleidomastoid strength 5/5  XI: neck flexion strength  5/5  XII: tongue strength  Normal    Data Review Lab Results  Component Value Date   WBC 10.0 09/08/2019   HGB 8.3 (L) 09/08/2019   HCT 24.3 (L) 09/08/2019   MCV 93.5 09/08/2019   PLT 672 (H) 09/08/2019   Lab Results  Component Value Date   NA 133 (L) 09/08/2019   K 3.2 (L) 09/08/2019   CL 96 (L) 09/08/2019   CO2 30 09/08/2019   BUN <5 (L) 09/08/2019   CREATININE 0.46 (L) 09/08/2019   GLUCOSE 89 09/08/2019   No results found for: INR, PROTIME  Radiology: Ct Chest W Contrast  Result Date: 08/28/2019 CLINICAL DATA:  68 year old male with fever and leukocytosis. EXAM: CT CHEST, ABDOMEN, AND PELVIS WITH CONTRAST TECHNIQUE: Multidetector CT imaging of the chest, abdomen and pelvis was performed following the standard protocol during bolus administration of intravenous contrast. CONTRAST:  OMNIPAQUE IOHEXOL 300 MG/ML  SOLN COMPARISON:  Chest radiograph dated 08/22/2019. FINDINGS: CT CHEST FINDINGS Cardiovascular: There is no cardiomegaly or pericardial effusion. Calcification of the mitral annulus noted. There is mild atherosclerotic calcification of the thoracic aorta. No aneurysmal dilatation or dissection. The central pulmonary arteries are unremarkable as visualized. Mediastinum/Nodes: No hilar or mediastinal adenopathy. The esophagus and the thyroid gland are grossly unremarkable. No mediastinal fluid collection. Lungs/Pleura: Trace left  pleural effusion. Scattered clusters of ground-glass and nodular opacities with central cavitation primarily involving the upper lobes noted. A patchy area of airspace opacity is noted in the right upper lobe with areas of cavitation. Findings may represent fungal infection, abscesses, TB, or septic emboli. Other etiologies are not excluded. Clinical correlation is recommended. There is no pneumothorax. Mucus content noted in the distal trachea extending into the left mainstem bronchus. The central airways however remain patent. Musculoskeletal: No chest wall mass or suspicious bone lesions identified. CT ABDOMEN PELVIS FINDINGS No intra-abdominal free air or free fluid. Hepatobiliary: The liver is unremarkable as visualized. No intrahepatic biliary ductal dilatation. The gallbladder is unremarkable. Pancreas: Unremarkable. No pancreatic ductal dilatation or surrounding inflammatory changes. Spleen: Normal in size without focal abnormality. Adrenals/Urinary Tract: The adrenal glands are unremarkable. There is no hydronephrosis on either side. There is symmetric enhancement and excretion of contrast by both kidneys. Slightly striated enhancement pattern of the renal parenchyma may be artifactual. Correlation with urinalysis recommended to exclude pyelonephritis. A subcentimeter left renal hypodense lesion is too small to characterize. The visualized ureters appear unremarkable. The urinary bladder is predominantly collapsed. There is apparent  diffuse thickening of the bladder wall which may be partly related to underdistention. Cystitis is not excluded. Correlation with urinalysis recommended. Stomach/Bowel: A rectal tube and contrast noted throughout the colon. There is no bowel obstruction or active inflammation. There are scattered sigmoid diverticula without active inflammation. The appendix is normal. Vascular/Lymphatic: Mild aortoiliac atherosclerotic disease. The IVC is unremarkable. No portal venous gas.  There is no adenopathy. Reproductive: The prostate and seminal vesicles are grossly unremarkable. Other: There is intramuscular hematoma involving the left psoas muscle extending to the left iliacus muscle. The hematoma in the left psoas muscle measures approximately 6.5 x 5.3 cm in greatest axial dimensions and 10 cm in craniocaudal length. There is linear contrast blushing within the left psoas muscle suggestive of active bleed. There is a 3.7 x 3.5 cm low attenuating/cystic structure in the region of the left inguinal canal which may represent a seroma or an old hematoma, or possibly a lymphocele. A necrotic mass or lymph node is favored less likely but not excluded. Correlation with clinical exam and follow-up recommended. Ultrasound may provide better evaluation of this structure. Musculoskeletal: Degenerative changes of the spine. No acute osseous pathology. IMPRESSION: 1. Left psoas intramuscular hematoma with evidence of small active bleed. 2. No bowel obstruction or active inflammation. Normal appendix. 3. Sigmoid diverticulosis. 4. Bilateral pulmonary densities with pattern concerning for fungal infection, abscesses, TB, or septic emboli. Other etiologies are not excluded. Clinical correlation is recommended. Trace left pleural effusion. 5. Slight heterogeneous nephrogram may be artifactual or represent pyelonephritis. Correlation with urinalysis recommended. 6. Aortic Atherosclerosis (ICD10-I70.0). These results were called by telephone at the time of interpretation on 08/28/2019 at 1:45 pm to provider [facility] , who verbally acknowledged these results. Electronically Signed   By: Elgie Collard M.D.   On: 08/28/2019 13:51   Ct Abdomen Pelvis W Contrast  Result Date: 08/28/2019 CLINICAL DATA:  68 year old male with fever and leukocytosis. EXAM: CT CHEST, ABDOMEN, AND PELVIS WITH CONTRAST TECHNIQUE: Multidetector CT imaging of the chest, abdomen and pelvis was performed following the standard  protocol during bolus administration of intravenous contrast. CONTRAST:  OMNIPAQUE IOHEXOL 300 MG/ML  SOLN COMPARISON:  Chest radiograph dated 08/22/2019. FINDINGS: CT CHEST FINDINGS Cardiovascular: There is no cardiomegaly or pericardial effusion. Calcification of the mitral annulus noted. There is mild atherosclerotic calcification of the thoracic aorta. No aneurysmal dilatation or dissection. The central pulmonary arteries are unremarkable as visualized. Mediastinum/Nodes: No hilar or mediastinal adenopathy. The esophagus and the thyroid gland are grossly unremarkable. No mediastinal fluid collection. Lungs/Pleura: Trace left pleural effusion. Scattered clusters of ground-glass and nodular opacities with central cavitation primarily involving the upper lobes noted. A patchy area of airspace opacity is noted in the right upper lobe with areas of cavitation. Findings may represent fungal infection, abscesses, TB, or septic emboli. Other etiologies are not excluded. Clinical correlation is recommended. There is no pneumothorax. Mucus content noted in the distal trachea extending into the left mainstem bronchus. The central airways however remain patent. Musculoskeletal: No chest wall mass or suspicious bone lesions identified. CT ABDOMEN PELVIS FINDINGS No intra-abdominal free air or free fluid. Hepatobiliary: The liver is unremarkable as visualized. No intrahepatic biliary ductal dilatation. The gallbladder is unremarkable. Pancreas: Unremarkable. No pancreatic ductal dilatation or surrounding inflammatory changes. Spleen: Normal in size without focal abnormality. Adrenals/Urinary Tract: The adrenal glands are unremarkable. There is no hydronephrosis on either side. There is symmetric enhancement and excretion of contrast by both kidneys. Slightly striated enhancement pattern of the  renal parenchyma may be artifactual. Correlation with urinalysis recommended to exclude pyelonephritis. A subcentimeter left  renal hypodense lesion is too small to characterize. The visualized ureters appear unremarkable. The urinary bladder is predominantly collapsed. There is apparent diffuse thickening of the bladder wall which may be partly related to underdistention. Cystitis is not excluded. Correlation with urinalysis recommended. Stomach/Bowel: A rectal tube and contrast noted throughout the colon. There is no bowel obstruction or active inflammation. There are scattered sigmoid diverticula without active inflammation. The appendix is normal. Vascular/Lymphatic: Mild aortoiliac atherosclerotic disease. The IVC is unremarkable. No portal venous gas. There is no adenopathy. Reproductive: The prostate and seminal vesicles are grossly unremarkable. Other: There is intramuscular hematoma involving the left psoas muscle extending to the left iliacus muscle. The hematoma in the left psoas muscle measures approximately 6.5 x 5.3 cm in greatest axial dimensions and 10 cm in craniocaudal length. There is linear contrast blushing within the left psoas muscle suggestive of active bleed. There is a 3.7 x 3.5 cm low attenuating/cystic structure in the region of the left inguinal canal which may represent a seroma or an old hematoma, or possibly a lymphocele. A necrotic mass or lymph node is favored less likely but not excluded. Correlation with clinical exam and follow-up recommended. Ultrasound may provide better evaluation of this structure. Musculoskeletal: Degenerative changes of the spine. No acute osseous pathology. IMPRESSION: 1. Left psoas intramuscular hematoma with evidence of small active bleed. 2. No bowel obstruction or active inflammation. Normal appendix. 3. Sigmoid diverticulosis. 4. Bilateral pulmonary densities with pattern concerning for fungal infection, abscesses, TB, or septic emboli. Other etiologies are not excluded. Clinical correlation is recommended. Trace left pleural effusion. 5. Slight heterogeneous nephrogram may  be artifactual or represent pyelonephritis. Correlation with urinalysis recommended. 6. Aortic Atherosclerosis (ICD10-I70.0). These results were called by telephone at the time of interpretation on 08/28/2019 at 1:45 pm to provider [facility] , who verbally acknowledged these results. Electronically Signed   By: Elgie Collard M.D.   On: 08/28/2019 13:51     Assessment/Plan: There is no height or weight on file to calculate BMI.   I have evaluated the MRI of the cervical spine, brain, and thoracic spine.  I have reviewed the reports.  I have discussed the case extensively with one of my partners, Dr. Danielle Dess.  We have gone over the imaging together.  I also separately went over the imaging with Dr. Wynetta Emery, and he is in agreement with our assessment.  I think this is a difficult and complex situation.  The ventral epidural collection is fairly large and does cause some stenosis but no obvious signal change in the cord and his exam seems to be stable, and we are not sure that surgery either from an anterior or posterior approach offers much benefit at this time.  We suspect that the ventral epidural collection is actually epidural phlegmon based on the imaging and not liquid pus.  There may be an abscess wall with a liquid pus within the abscess.  But we think that the liquid is contained and has structure to it.  This is important when making surgical decisions.  This epidural collection would be difficult to get to from any surgical approach we are comfortable with.  Even a C2-3 ACDF would be difficult to remove much of the mass-effect unless it is clear liquid pus.  Most of the compression is behind the dens and the body of C2.  From a posterior approach we could consider  C1 and C2 and C3 laminectomies and this would potentially allow for some posterior decompression but, getting anterior to the cord at this level to release any pus would be dangerous and difficult, and again we would not be able to  release any pus if this were abscess or phlegmon.  Therefore I am not sure that we could accomplish much with a decompression at this time.  Again, Dr. Ellene Route and I went over the imaging and we agreed that surgery may offer more risk of harm than potential benefit at this time.  The patient does seem to be responding to IV antibiotics as his mental status has improved, and his motor exam is at least stable from last week if not slightly improved, his mental status has improved, and his white count is down.  He does not have evidence of cord compression/myelopathy on exam and the weakness in the right shoulder is more likely either root involvement from the osteomyelitis at C4-5 and C5-6 and some epidural disease at C5-6, or it could be orthopedic in nature.  The patient feels like that right shoulder has been that way "for a month or more."  The patient is DNR and palliative care has been consulted and therefore I suspect that the family does not want to be overly aggressive, but we will call the family and talk to the son if we can reach him.  We recommend treatment with IV antibiotics per infectious disease, and hope that this will lead to resolution of the osteomyelitis and epidural process.  Ryan Lane 09/08/2019 3:38 PM

## 2019-09-09 ENCOUNTER — Encounter (HOSPITAL_COMMUNITY): Payer: Self-pay | Admitting: Radiology

## 2019-09-09 LAB — CBC WITH DIFFERENTIAL/PLATELET
Abs Immature Granulocytes: 0.07 10*3/uL (ref 0.00–0.07)
Basophils Absolute: 0.1 10*3/uL (ref 0.0–0.1)
Basophils Relative: 1 %
Eosinophils Absolute: 0.2 10*3/uL (ref 0.0–0.5)
Eosinophils Relative: 2 %
HCT: 24.3 % — ABNORMAL LOW (ref 39.0–52.0)
Hemoglobin: 8 g/dL — ABNORMAL LOW (ref 13.0–17.0)
Immature Granulocytes: 1 %
Lymphocytes Relative: 16 %
Lymphs Abs: 1.7 10*3/uL (ref 0.7–4.0)
MCH: 31.5 pg (ref 26.0–34.0)
MCHC: 32.9 g/dL (ref 30.0–36.0)
MCV: 95.7 fL (ref 80.0–100.0)
Monocytes Absolute: 1 10*3/uL (ref 0.1–1.0)
Monocytes Relative: 10 %
Neutro Abs: 7.6 10*3/uL (ref 1.7–7.7)
Neutrophils Relative %: 70 %
Platelets: 670 10*3/uL — ABNORMAL HIGH (ref 150–400)
RBC: 2.54 MIL/uL — ABNORMAL LOW (ref 4.22–5.81)
RDW: 13 % (ref 11.5–15.5)
WBC: 10.6 10*3/uL — ABNORMAL HIGH (ref 4.0–10.5)
nRBC: 0 % (ref 0.0–0.2)

## 2019-09-09 LAB — GLUCOSE, CAPILLARY
Glucose-Capillary: 124 mg/dL — ABNORMAL HIGH (ref 70–99)
Glucose-Capillary: 88 mg/dL (ref 70–99)
Glucose-Capillary: 88 mg/dL (ref 70–99)
Glucose-Capillary: 97 mg/dL (ref 70–99)
Glucose-Capillary: 99 mg/dL (ref 70–99)

## 2019-09-09 LAB — VANCOMYCIN, PEAK: Vancomycin Pk: 44 ug/mL — ABNORMAL HIGH (ref 30–40)

## 2019-09-09 MED ORDER — HEPARIN SOD (PORK) LOCK FLUSH 100 UNIT/ML IV SOLN
250.0000 [IU] | INTRAVENOUS | Status: DC | PRN
  Filled 2019-09-09: qty 2.5

## 2019-09-09 MED ORDER — SENNOSIDES-DOCUSATE SODIUM 8.6-50 MG PO TABS: 1.0000 | ORAL_TABLET | Freq: Two times a day (BID) | ORAL | 0 refills | Status: AC

## 2019-09-09 MED ORDER — POLYETHYLENE GLYCOL 3350 17 G PO PACK: 17.0000 g | PACK | Freq: Every day | ORAL | 0 refills | Status: AC | PRN

## 2019-09-09 MED ORDER — THIAMINE HCL 100 MG PO TABS: 100.0000 mg | ORAL_TABLET | Freq: Every day | ORAL | 0 refills | Status: AC

## 2019-09-09 MED ORDER — FOLIC ACID 1 MG PO TABS: 1.0000 mg | ORAL_TABLET | Freq: Every day | ORAL | 0 refills | Status: AC

## 2019-09-09 MED ORDER — HEPARIN SOD (PORK) LOCK FLUSH 100 UNIT/ML IV SOLN
250.0000 [IU] | INTRAVENOUS | Status: AC | PRN
  Administered 2019-09-09: 250 [IU]
  Filled 2019-09-09: qty 2.5

## 2019-09-09 MED ORDER — ASPIRIN 81 MG PO CHEW: 81.0000 mg | CHEWABLE_TABLET | Freq: Every day | ORAL | Status: AC

## 2019-09-09 MED ORDER — PRO-STAT SUGAR FREE PO LIQD: 30.0000 mL | Freq: Two times a day (BID) | ORAL | 0 refills | Status: DC

## 2019-09-09 MED ORDER — ADULT MULTIVITAMIN W/MINERALS CH: 1.0000 | ORAL_TABLET | Freq: Every day | ORAL | Status: AC

## 2019-09-09 MED ORDER — OSMOLITE 1.5 CAL PO LIQD: 237.0000 mL | ORAL | 0 refills | Status: DC

## 2019-09-09 MED ORDER — NEPRO/CARBSTEADY PO LIQD: 237.0000 mL | Freq: Two times a day (BID) | ORAL | 0 refills | Status: AC

## 2019-09-09 MED ORDER — VANCOMYCIN IV (FOR PTA / DISCHARGE USE ONLY): 1250.0000 mg | Freq: Two times a day (BID) | INTRAVENOUS | 0 refills | Status: AC

## 2019-09-09 MED ORDER — SENNOSIDES-DOCUSATE SODIUM 8.6-50 MG PO TABS
1.0000 | ORAL_TABLET | Freq: Two times a day (BID) | ORAL | Status: DC
  Administered 2019-09-09: 1 via ORAL
  Filled 2019-09-09: qty 1

## 2019-09-09 MED ORDER — BACLOFEN 5 MG PO TABS: 5.0000 mg | ORAL_TABLET | Freq: Two times a day (BID) | ORAL | 0 refills | Status: AC

## 2019-09-09 MED ORDER — OSMOLITE 1.5 CAL PO LIQD: 1000.0000 mL | ORAL | 0 refills | Status: DC

## 2019-09-09 MED ORDER — RESOURCE THICKENUP CLEAR PO POWD: ORAL | 1 refills | Status: AC

## 2019-09-09 MED ORDER — HYDRALAZINE HCL 25 MG PO TABS: 25.0000 mg | ORAL_TABLET | Freq: Three times a day (TID) | ORAL | 0 refills | Status: AC

## 2019-09-09 MED ORDER — POLYETHYLENE GLYCOL 3350 17 G PO PACK
17.0000 g | PACK | Freq: Every day | ORAL | Status: DC
  Administered 2019-09-09: 17 g via ORAL
  Filled 2019-09-09: qty 1

## 2019-09-09 NOTE — Progress Notes (Signed)
Palliative follow up -   Reviewed chart - specifically PT/OT/SLP notes.  Discussed with Dr. Karleen Hampshire.  Spoke with son Ryan Lane) and nephew Ryan Lane) on conference call.  We discussed that the patient is still very fragile.  He is at high risk for aspiration.  He is at high risk for recurrent infection.  He has multiple abscesses in his spine that are not amenable to surgery.  He is very deconditioned and his mental status waxes and wanes.   He will be on IV antibiotics for weeks.  The family understands and is ready for him to be discharged to SNF.  I provided Ryan Lane and Ryan Lane with my phone number for future support or questions.  Assessment:  Very fragile, very weak, high likelihood for readmission.  Has received maximum benefit from this hospitalization and is ready for dc to SNF rehab.  Recommendation:  DC TO SNF WITH PALLIATIVE CARE TO FOLLOW.  Florentina Jenny, PA-C Palliative Medicine Office:  [phone]  25 min.

## 2019-09-09 NOTE — Plan of Care (Signed)
  Problem: Nutrition: Goal: Risk of aspiration will decrease Outcome: Not Progressing   

## 2019-09-09 NOTE — Discharge Summary (Addendum)
Physician Discharge Summary  Ryan Lane LOV:[id number] DOB: [birth year] DOA: 08/28/2019  PCP: Patient, No Pcp Per  Admit date: 08/28/2019 Discharge date: 09/09/2019  Admitted From: HOME Disposition: BLUMENTHALS  Recommendations for Outpatient Follow-up:  1. Follow up with PCP in 1-2 weeks 2. Please obtain BMP/CBC in one week 3. Please follow up with neurology in 2 weeks.  4. Please follow up with infectious disease as recommended.  5. Please follow up with neuro surgery as needed.  6. Please follow up with palliative care services on discharge.   Discharge Condition: GUARDED, POOR PRONOSIS CODE STATUS:DNR  Diet recommendation: NECTAR THICK LIQUID DIET WITH FULL SUPERVISION.   Brief/Interim Summary: 68 year old male with prior history of alcohol abuse, hyponatremia was initially brought to [facility] on 7 November and found to have MRSA bacteremia and acute stroke involving the right cerebellar and right corona radiata.  He was transferred from [facility] to [facility] on November 12 for further evaluation and management.  He underwent a TEE which was negative for acute endocarditis.  Patient had persistent bacteremia with repeat blood cultures from 08/28/2019.  Blood cultures from 09/01/19 are negative so far.  Infectious disease is on board. Recommended 6 weeks of IV vancomycin for presumed endocarditis with CNS emboli and PICC line placement in the next 24 hours if cultures remain negative . The last date of IV Vancomycin is 12/28.  Discussed with the son and the patient regarding MRI under conscious sedation, they have agreed to get it done.  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  Baclofen ordered with minimal improvement.  Soft neck collar placed.  MRI of the cervical spine and lumbar spine along with Brain ordered today.  MRI brain showed septic emboli and focal hemorrhage in the temporal area.  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  [facility] Neuro surgery consult requested , but he was not deemed a candidate for surgery.  Discussed with the son over the phone of the above results.  They would like him to be discharged to SNF.  Discharge Diagnoses:  Principal Problem:   MRSA bacteremia Active Problems:   Alcohol withdrawal ([place])   Acute CVA (cerebrovascular accident) ([place])   Hyponatremia   Dyspnea and respiratory abnormalities   Palliative care encounter   Encounter for hospice care discussion   Goals of care, counseling/discussion   Palliative care by specialist   Epidural abscess  Sepsis secondary to MRSA bacteremia and pneumonia Persistent MRSA bacteremia on blood cultures from 08/28/2019 and negative blood cultures from 09/01/2019.  Patient is currently on IV vancomycin , ID consulted and recommended a totalof 6 weeks of treatment for presumed endocarditis and CNS emboli. Further evaluation of the source of persistent bacteremia with an MRI of the cervical and thoracic spine ordered but unfortunately could not be done as patient could not lay flat for the study to be performed. It is ordered to be done under conscious sedation after discussing with the patient and his son over the phone.  PICC line placed and plan for 6 weeks of IV antibiotic therapy.  MRI brain , cervical spine and lumbar spine done under sedation.  MRI brain showed septic emboli and focal hemorrhage in the temporal area.  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  Stat Neuro  surgery consult requested , but he was not deemed a candidate for surgery.  Discussed the above with the family. Plan for discharge today.     MRSA Pneumonia:  Weaned him off oxygen, pt on RA with sats greater than 90% Repeat CXR shows Marked decrease in the large left  pleural effusion. Persistent moderate left effusion. New ill-defined infiltrate at the right lung base. Multiple cavitary lesions in both lungs.     Acute toxic/metabolic encephalopathy in the setting of MRSA bacteremia and pneumonia, stroke.   Neurology on board recommended an MRI of the brain but could not be done as patient could not lay flat and tolerated.   An EEG did not show any epileptiform findings.  Another differential could be Wernicke's encephalopathy. Pt is currently awake and answering questions appropriately.  He continues to have neck pain and stiffness , with slight torticollis to the left side, probably ? Cervical dystonia.    ? Cervical dystonia: pain in the neck associated with stiffness.  - baclofen ordered with minimal improvement. Soft neck collar ordered.   Cerebellar CVA MRI from [facility] showed punctate right cerebellar and right corona radiata. Neurology is on board and suggested no indication of LP as it would not change the management of MRSA bacteremia and CNS emboli.     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   Left psoas intramuscular hematoma Patient's hemoglobin continues to drop from 11.6-11 0.1-9 0.4-8.8 to 8.3- 8.5- 8.4 and stable.  Aspirin restarted as the hematoma is stable.  Hemoglobin stable around 8.    Hyponatremia Improved to 133.   Dysphagia Neck thick liquid by SLP.   Hypokalemia Replaced  History of alcohol abuse No signs of withdrawal at this time.  Mildly elevated liver enzymes Resolved.  .  Patient denies any nausea or vomiting at this time.   Anemia of chronic disease and anemia of blood loss from the hematoma:  Transfuse to keep hemoglobin greater than 7.  Hemoglobin stable around 8.3.   Essential hypertension:  Well controlled.   In view of poor functional status, clinical deterioration, persistent MRSA bacteremia and fevers palliative care  consulted and is on board with discussions with the family.  He is currently DNR and further recommendations to follow.   Discharge Instructions  Discharge Instructions    Discharge instructions   Complete by: As directed    Please follow up with PCP in one week.   Home infusion instructions [facility] May follow [place] Dosing Protocol; May administer Cathflo as needed to maintain patency of vascular access device.; Flushing of vascular access device: per [facility] Protocol: 0.9% NaCl pre/post medica...   Complete by: As directed    Instructions: May follow [place] Dosing Protocol   Instructions: May administer Cathflo as needed to maintain patency of vascular access device.   Instructions: Flushing of vascular access device: per [facility] Protocol: 0.9% NaCl pre/post medication administration and prn patency; Heparin 100 u/ml, 14m for implanted ports and Heparin 10u/ml, 560mfor all other central venous catheters.   Instructions: May follow [facility] Anaphylaxis Protocol for First Dose Administration in the home: 0.9% NaCl at 25-50 ml/hr to maintain IV access for protocol meds. Epinephrine 0.3 ml IV/IM PRN and Benadryl 25-50 IV/IM PRN s/s of anaphylaxis.   Instructions: [facility] Coordinator (RN) to assist per patient IV care needs in the home PRN.     Allergies as of 09/09/2019   No Known Allergies  Medication List    TAKE these medications   aspirin 81 MG chewable tablet Chew 1 tablet (81 mg total) by mouth daily. Start taking on: September 10, 2019   Baclofen 5 MG Tabs Take 5 mg by mouth 2 (two) times daily. Notes to patient: 09/09/2019   feeding supplement (NEPRO CARB STEADY) Liqd Take 237 mLs by mouth 2 (two) times daily between meals. Notes to patient: 51/70/0174   folic acid 1 MG tablet Commonly known as: FOLVITE Take 1 tablet (1 mg total) by mouth daily. Start taking on: September 10, 2019   hydrALAZINE 25 MG tablet Commonly known as:  APRESOLINE Take 1 tablet (25 mg total) by mouth every 8 (eight) hours. Notes to patient: 09/09/2019   multivitamin with minerals Tabs tablet Take 1 tablet by mouth daily. Start taking on: September 10, 2019   OVER THE COUNTER MEDICATION Apply 1 application topically as needed (excema flare ups).   polyethylene glycol 17 g packet Commonly known as: MIRALAX / GLYCOLAX Take 17 g by mouth daily as needed.   Resource ThickenUp Clear Powd Use with every meals. Notes to patient: 09/09/2019   senna-docusate 8.6-50 MG tablet Commonly known as: Senokot-S Take 1 tablet by mouth 2 (two) times daily. Notes to patient: 09/09/2019   thiamine 100 MG tablet Take 1 tablet (100 mg total) by mouth daily. Start taking on: September 10, 2019   vancomycin  IVPB Inject 1,250 mg into the vein every 12 (twelve) hours. Indication:  MRSA bacteremia w/ concern for endocarditis Last Day of Therapy: 10/13/2019 Labs - 'Sunday/Monday:  CBC/D, BMP, and vancomycin trough. Labs - Thursday:  BMP and vancomycin trough Labs - Every other week:  ESR and CRP Notes to patient: 09/09/2019            Home Infusion Instuctions  (From admission, onward)         Start     Ordered   09/09/19 0000  Home infusion instructions [facility] May follow [facility] Dosing Protocol; May administer Cathflo as needed to maintain patency of vascular access device.; Flushing of vascular access device: per [facility] Protocol: 0.9% NaCl pre/post medica...    Question Answer Comment  Instructions May follow [facility] Dosing Protocol   Instructions May administer Cathflo as needed to maintain patency of vascular access device.   Instructions Flushing of vascular access device: per [facility] Protocol: 0.9% NaCl pre/post medication administration and prn patency; Heparin 100 u/ml, 5ml for implanted ports and Heparin 10u/ml, 5ml for all other central venous catheters.   Instructions May follow [facility] Anaphylaxis Protocol for First Dose  Administration in the home: 0.9% NaCl at 25-50 ml/hr to maintain IV access for protocol meds. Epinephrine 0.3 ml IV/IM PRN and Benadryl 25-50 IV/IM PRN s/s of anaphylaxis.   Instructions [facility] Infusion Coordinator (RN) to assist per patient IV care needs in the home PRN.      11'$ /24/20 1359          Contact information for follow-up providers    primary care physician. Schedule an appointment as soon as possible for a visit in 1 week(s).        Eustace Moore, MD Follow up.   Specialty: Neurosurgery Why: as needed.  Contact information: [address] [place] [address] [phone]            Contact information for after-discharge care    Destination    [facility]erred SNF .   Service:  Skilled Nursing Contact information: [place] [phone]                 No Known Allergies  Consultations:    Infectious disease  Neurology neuro surgery.   Procedures/Studies: Ct Chest W Contrast  Result Date: 08/28/2019 CLINICAL DATA:  68 year old male with fever and leukocytosis. EXAM: CT CHEST, ABDOMEN, AND PELVIS WITH CONTRAST TECHNIQUE: Multidetector CT imaging of the chest, abdomen and pelvis was performed following the standard protocol during bolus administration of intravenous contrast. CONTRAST:  175m OMNIPAQUE IOHEXOL 300 MG/ML  SOLN COMPARISON:  Chest radiograph dated 08/22/2019. FINDINGS: CT CHEST FINDINGS Cardiovascular: There is no cardiomegaly or pericardial effusion. Calcification of the mitral annulus noted. There is mild atherosclerotic calcification of the thoracic aorta. No aneurysmal dilatation or dissection. The central pulmonary arteries are unremarkable as visualized. Mediastinum/Nodes: No hilar or mediastinal adenopathy. The esophagus and the thyroid gland are grossly unremarkable. No mediastinal fluid collection. Lungs/Pleura: Trace left pleural  effusion. Scattered clusters of ground-glass and nodular opacities with central cavitation primarily involving the upper lobes noted. A patchy area of airspace opacity is noted in the right upper lobe with areas of cavitation. Findings may represent fungal infection, abscesses, TB, or septic emboli. Other etiologies are not excluded. Clinical correlation is recommended. There is no pneumothorax. Mucus content noted in the distal trachea extending into the left mainstem bronchus. The central airways however remain patent. Musculoskeletal: No chest wall mass or suspicious bone lesions identified. CT ABDOMEN PELVIS FINDINGS No intra-abdominal free air or free fluid. Hepatobiliary: The liver is unremarkable as visualized. No intrahepatic biliary ductal dilatation. The gallbladder is unremarkable. Pancreas: Unremarkable. No pancreatic ductal dilatation or surrounding inflammatory changes. Spleen: Normal in size without focal abnormality. Adrenals/Urinary Tract: The adrenal glands are unremarkable. There is no hydronephrosis on either side. There is symmetric enhancement and excretion of contrast by both kidneys. Slightly striated enhancement pattern of the renal parenchyma may be artifactual. Correlation with urinalysis recommended to exclude pyelonephritis. A subcentimeter left renal hypodense lesion is too small to characterize. The visualized ureters appear unremarkable. The urinary bladder is predominantly collapsed. There is apparent diffuse thickening of the bladder wall which may be partly related to underdistention. Cystitis is not excluded. Correlation with urinalysis recommended. Stomach/Bowel: A rectal tube and contrast noted throughout the colon. There is no bowel obstruction or active inflammation. There are scattered sigmoid diverticula without active inflammation. The appendix is normal. Vascular/Lymphatic: Mild aortoiliac atherosclerotic disease. The IVC is unremarkable. No portal venous gas. There is no  adenopathy. Reproductive: The prostate and seminal vesicles are grossly unremarkable. Other: There is intramuscular hematoma involving the left psoas muscle extending to the left iliacus muscle. The hematoma in the left psoas muscle measures approximately 6.5 x 5.3 cm in greatest axial dimensions and 10 cm in craniocaudal length. There is linear contrast blushing within the left psoas muscle suggestive of active bleed. There is a 3.7 x 3.5 cm low attenuating/cystic structure in the region of the left inguinal canal which may represent a seroma or an old hematoma, or possibly a lymphocele. A necrotic mass or lymph node is favored less likely but not excluded. Correlation with clinical exam and follow-up recommended. Ultrasound may provide better evaluation of this structure. Musculoskeletal: Degenerative changes of the spine. No acute osseous pathology. IMPRESSION: 1. Left psoas intramuscular hematoma with evidence of small active bleed. 2. No bowel obstruction or active inflammation. Normal appendix. 3. Sigmoid diverticulosis. 4. Bilateral pulmonary densities with pattern concerning  for fungal infection, abscesses, TB, or septic emboli. Other etiologies are not excluded. Clinical correlation is recommended. Trace left pleural effusion. 5. Slight heterogeneous nephrogram may be artifactual or represent pyelonephritis. Correlation with urinalysis recommended. 6. Aortic Atherosclerosis (ICD10-I70.0). These results were called by telephone at the time of interpretation on 08/28/2019 at 1:45 pm to provider [facility] , who verbally acknowledged these results. Electronically Signed   By: Anner Crete M.D.   On: 08/28/2019 13:51   Mr Jeri Cos NT Contrast  Result Date: 09/08/2019 CLINICAL DATA:  Encephalopathy.  Fever, bacteremia. EXAM: MRI HEAD WITHOUT AND WITH CONTRAST TECHNIQUE: Multiplanar, multiecho pulse sequences of the brain and surrounding structures were obtained without and with intravenous contrast.  CONTRAST:  33m GADAVIST GADOBUTROL 1 MMOL/ML IV SOLN COMPARISON:  MRI head 08/26/2019 FINDINGS: Brain: The study was done with the assistance of anesthesia. A good quality study was obtained. Mild atrophy. Negative for hydrocephalus. Chronic microvascular ischemic change in the white matter and pons. Small areas of facilitated diffusion in the frontal white matter bilaterally most likely T2 shine through from subacute infarction. These were not present on the prior study. Focal area of hemorrhage in the left posterior temporal/parietal lobe which shows enhancement postcontrast administration. This area of hemorrhage may be related to infarction or infection but does not show restricted diffusion at this point. This area of hemorrhage has progressed since prior MRI. In addition, microhemorrhage is present in the right temporoparietal lobes. Postcontrast infusion, numerous small enhancing nodules are present in the brain bilaterally. Most these measure approximately 3-5 mm with the exception of the left posterior temporal enhancement which is approximately 5 x 15 mm. Small enhancing nodules in the right cerebellum, and scattered throughout both cerebral hemispheres. No mass-effect or midline shift. These lesions do not demonstrate significant edema. Prior MRI was done without contrast. Vascular: Normal arterial flow voids. Skull and upper cervical spine: Skull and skull base intact. Bone marrow edema in the upper cervical spine at C2 and C3 with large ventral epidural fluid collection. See MRI cervical spine report for further detail. Sinuses/Orbits: Bilateral mastoid effusion. Mucosal edema throughout the paranasal sinuses with air-fluid level left maxillary sinus. Other: None IMPRESSION: 1. Multiple small enhancing nodules in the brain bilaterally. Given history of bacteremia, these are likely septic emboli possibly with infarction but no brain abscess. Hemorrhagic lesion left posterior temporal lobe with mild  enhancement likely also an area of septic embolus. 2. Atrophy and chronic microvascular ischemia.  No acute infarct 3. Extensive sinusitis 4. Large ventral epidural fluid collection and cord compression at C2-3. See MRI cervical spine report. Electronically Signed   By: CFranchot GalloM.D.   On: 09/08/2019 14:16   [name] Contrast  Result Date: 09/08/2019 CLINICAL DATA:  Fever delirium back pain. Bacteremia. EXAM: MRI CERVICAL SPINE WITHOUT AND WITH CONTRAST TECHNIQUE: Multiplanar and multiecho pulse sequences of the cervical spine, to include the craniocervical junction and cervicothoracic junction, were obtained without and with intravenous contrast. CONTRAST:  82mGADAVIST GADOBUTROL 1 MMOL/ML IV SOLN COMPARISON:  None. FINDINGS: Alignment: The study was performed with assistance of anesthesia. Normal alignment Vertebrae: Bone marrow edema and enhancement C4, C5, and C6. Negative for fracture. Cord: Multilevel spinal stenosis. Cord compression at C2-3 due to large ventral epidural fluid collection compatible with abscess. No cord signal abnormality. Posterior Fossa, vertebral arteries, paraspinal tissues: The patient is intubated with secretions in the pharynx. Disc levels: Large ventral epidural fluid collection with rim enhancement extending from C1  through C3-4. The fluid collection measures approximately 8 x 25 mm in transverse dimension and extends over 5.5 cm. This is causing posterior displacement of the cord with cord compression. Bone marrow edema and disc space edema at C4-5 and C5-6 are most consistent with discitis and osteomyelitis. There is diffuse dural thickening in the cervical spine due to infection. C1-2: Degenerative change. Ventral epidural abscess extends to this level causing posterior displacement of the cord and cord compression C2-3: Ventral epidural abscess with cord compression and moderate to severe spinal stenosis. C3-4: Disc degeneration and spurring. Mild spinal  stenosis. Ventral epidural abscess terminates at this level. Neural foramina patent bilaterally C4-5: Bone marrow edema enhancement compatible with discitis/osteomyelitis. Circumferential dural enhancement and moderate spinal stenosis. C5-6: Bone marrow edema and enhancement compatible with discitis osteomyelitis. Prominent ventral epidural enhancement with posterior displacement of the cord and moderate spinal stenosis. Ventral epidural fluid collection measuring approximately 4 x 10 mm which appears to be arising from the disc space may represent additional epidural abscess behind the C5 vertebral body. C6-7: Disc degeneration and spondylosis. 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 Smaller 4 x 10 mm ventral epidural abscess at C5 with moderate spinal stenosis. Diffuse dural thickening in the cervical spine due to infection. These results were called by telephone at the time of interpretation on 09/08/2019 at 2:26 pm to provider [facility] , who verbally acknowledged these results. Electronically Signed   By: Franchot Gallo M.D.   On: 09/08/2019 14:28   Mr Thoracic Spine W Wo Contrast  Result Date: 09/08/2019 CLINICAL DATA:  Bacteremia.  Back pain EXAM: MRI THORACIC WITHOUT AND WITH CONTRAST TECHNIQUE: Multiplanar and multiecho pulse sequences of the thoracic spine were obtained without and with intravenous contrast. CONTRAST:  70m GADAVIST GADOBUTROL 1 MMOL/ML IV SOLN COMPARISON:  None. FINDINGS: MRI THORACIC SPINE FINDINGS Alignment:  Normal Vertebrae: Negative for fracture or mass. No evidence of spinal infection in the thoracic spine. Cord:  Normal spinal cord signal.  No cord compression. Paraspinal and other soft tissues: Moderately large loculated left pleural effusion and small right pleural  effusion. Multiple lung nodules bilaterally. Prior chest CT also reported these nodules which may represent septic emboli given history. Neoplasm could give a similar appearance. Soft tissue swelling and edema in the posterior soft tissues in the upper back at approximately the C2-3 level. Small fluid collections are present in the muscles at this level which could represent small muscle abscesses. Right-sided fluid collection measures approximately 12 x 24 mm. Left-sided fluid collection measures approximately 8 x 15 mm. Disc levels: Mild thoracic degenerative changes. Abnormal disc spaces described below. T5-6: Small central disc protrusion T6-7: Small central disc protrusion. T8-9: Small central disc protrusion T10-11: Small right-sided disc protrusion. IMPRESSION: Negative for spinal infection in the thoracic spine. There are small fluid collections within the posterior muscles at the C2-3 level which could represent small soft tissue abscesses not extending into the spinal canal Mild thoracic disc degeneration. Bilateral pleural effusions and bilateral lung nodules most likely septic emboli given the history and prior imaging studies. Electronically Signed   By: CFranchot GalloM.D.   On: 09/08/2019 14:37   Ct Abdomen Pelvis W Contrast  Result Date: 08/28/2019 CLINICAL DATA:  68year old male with fever and leukocytosis. EXAM: CT CHEST, ABDOMEN, AND PELVIS WITH CONTRAST TECHNIQUE: Multidetector CT  imaging of the chest, abdomen and pelvis was performed following the standard protocol during bolus administration of intravenous contrast. CONTRAST:  158m OMNIPAQUE IOHEXOL 300 MG/ML  SOLN COMPARISON:  Chest radiograph dated 08/22/2019. FINDINGS: CT CHEST FINDINGS Cardiovascular: There is no cardiomegaly or pericardial effusion. Calcification of the mitral annulus noted. There is mild atherosclerotic calcification of the thoracic aorta. No aneurysmal dilatation or dissection. The central pulmonary arteries are  unremarkable as visualized. Mediastinum/Nodes: No hilar or mediastinal adenopathy. The esophagus and the thyroid gland are grossly unremarkable. No mediastinal fluid collection. Lungs/Pleura: Trace left pleural effusion. Scattered clusters of ground-glass and nodular opacities with central cavitation primarily involving the upper lobes noted. A patchy area of airspace opacity is noted in the right upper lobe with areas of cavitation. Findings may represent fungal infection, abscesses, TB, or septic emboli. Other etiologies are not excluded. Clinical correlation is recommended. There is no pneumothorax. Mucus content noted in the distal trachea extending into the left mainstem bronchus. The central airways however remain patent. Musculoskeletal: No chest wall mass or suspicious bone lesions identified. CT ABDOMEN PELVIS FINDINGS No intra-abdominal free air or free fluid. Hepatobiliary: The liver is unremarkable as visualized. No intrahepatic biliary ductal dilatation. The gallbladder is unremarkable. Pancreas: Unremarkable. No pancreatic ductal dilatation or surrounding inflammatory changes. Spleen: Normal in size without focal abnormality. Adrenals/Urinary Tract: The adrenal glands are unremarkable. There is no hydronephrosis on either side. There is symmetric enhancement and excretion of contrast by both kidneys. Slightly striated enhancement pattern of the renal parenchyma may be artifactual. Correlation with urinalysis recommended to exclude pyelonephritis. A subcentimeter left renal hypodense lesion is too small to characterize. The visualized ureters appear unremarkable. The urinary bladder is predominantly collapsed. There is apparent diffuse thickening of the bladder wall which may be partly related to underdistention. Cystitis is not excluded. Correlation with urinalysis recommended. Stomach/Bowel: A rectal tube and contrast noted throughout the colon. There is no bowel obstruction or active inflammation.  There are scattered sigmoid diverticula without active inflammation. The appendix is normal. Vascular/Lymphatic: Mild aortoiliac atherosclerotic disease. The IVC is unremarkable. No portal venous gas. There is no adenopathy. Reproductive: The prostate and seminal vesicles are grossly unremarkable. Other: There is intramuscular hematoma involving the left psoas muscle extending to the left iliacus muscle. The hematoma in the left psoas muscle measures approximately 6.5 x 5.3 cm in greatest axial dimensions and 10 cm in craniocaudal length. There is linear contrast blushing within the left psoas muscle suggestive of active bleed. There is a 3.7 x 3.5 cm low attenuating/cystic structure in the region of the left inguinal canal which may represent a seroma or an old hematoma, or possibly a lymphocele. A necrotic mass or lymph node is favored less likely but not excluded. Correlation with clinical exam and follow-up recommended. Ultrasound may provide better evaluation of this structure. Musculoskeletal: Degenerative changes of the spine. No acute osseous pathology. IMPRESSION: 1. Left psoas intramuscular hematoma with evidence of small active bleed. 2. No bowel obstruction or active inflammation. Normal appendix. 3. Sigmoid diverticulosis. 4. Bilateral pulmonary densities with pattern concerning for fungal infection, abscesses, TB, or septic emboli. Other etiologies are not excluded. Clinical correlation is recommended. Trace left pleural effusion. 5. Slight heterogeneous nephrogram may be artifactual or represent pyelonephritis. Correlation with urinalysis recommended. 6. Aortic Atherosclerosis (ICD10-I70.0). These results were called by telephone at the time of interpretation on 08/28/2019 at 1:45 pm to provider [facility], who verbally acknowledged these results. Electronically Signed   By: AAnner Crete  M.D.   On: 08/28/2019 13:51   Dg Chest Port 1 View  Result Date: 09/05/2019 CLINICAL DATA:  Hypoxia.  Left pleural effusion. Cavitary lesions and infiltrates in the lungs. EXAM: PORTABLE CHEST 1 VIEW COMPARISON:  Chest x-ray dated 08/31/2019 FINDINGS: There has been a marked decrease in the large left pleural effusion. A moderate left effusion remains. Multiple cavitary lesions are again noted in both lungs. There is a new ill-defined area of infiltrate at the right lung base. The infiltrate at the right lung apex is slightly better defined. Feeding tube has been removed. No acute bone abnormality. Heart size and pulmonary vascularity are normal. Aortic atherosclerosis. IMPRESSION: 1. Marked decrease in the large left pleural effusion. 2. Persistent moderate left effusion. 3. New ill-defined infiltrate at the right lung base. 4. Multiple cavitary lesions in both lungs. 5. Aortic atherosclerosis. Electronically Signed   By: Lorriane Shire M.D.   On: 09/05/2019 13:11   Dg Chest Port 1 View  Result Date: 08/31/2019 CLINICAL DATA:  Dyspnea and respiratory abnormalities. EXAM: PORTABLE CHEST 1 VIEW COMPARISON:  08/22/2019 and chest, abdomen pelvis CT dated 08/28/2019. FINDINGS: Interval large left pleural effusion with online a small amount of aerated left lung remaining. Rotation of the upper chest to the left with evidence of interval mediastinal shift to the left as well. Significantly increased patchy opacity with areas of cavitation in the right upper and mid lung zones. Diffuse osteopenia. Feeding tube extending into the stomach. IMPRESSION: 1. Interval large left pleural effusion with only a small amount of aerated left lung remaining. 2. Interval mediastinal shift to the left, most likely due to a large central mucous plug and diffuse left lung atelectasis. 3. Significantly increased patchy opacity with areas of cavitation in the right upper and mid lung zones compatible with significant progression of cavitary infection. Electronically Signed   By: Claudie Revering M.D.   On: 08/31/2019 16:15   Dg Swallowing  Func-speech Pathology  Result Date: 09/04/2019 Objective Swallowing Evaluation: Type of Study: Bedside Swallow Evaluation  Patient Details Name: Ryan Lane MRN: [id number] Date of Birth: [birth year] Today's Date: 09/04/2019 Time: SLP Start Time (ACUTE ONLY): 0855 -SLP Stop Time (ACUTE ONLY): 0920 SLP Time Calculation (min) (ACUTE ONLY): 25 min Past Medical History: No past medical history on file. Past Surgical History: Past Surgical History: Procedure Laterality Date . BUBBLE STUDY  09/02/2019  Procedure: BUBBLE STUDY;  Surgeon: Pixie Casino, MD;  Location: [facility];  Service: Cardiovascular;; . TEE WITHOUT CARDIOVERSION N/A 09/02/2019  Procedure: TRANSESOPHAGEAL ECHOCARDIOGRAM (TEE);  Surgeon: Pixie Casino, MD;  Location: [facility];  Service: Cardiovascular;  Laterality: N/A; HPI: Pt is a 68 y.o. M with significant PMH of alcohol and polysubstance abuse who presents to ER with fever, chills, body aches and diarrhea. Admitted with pneumonia/bacteremia with MRSA, transferred to [facility] on 08/28/2019 for further treatment and possible TEE. CT chest/abd/pelvis showing left psoas intramuscular hematoma with evidence of small active bleed. 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  Subjective: pt awake in bed, head leaning up against tower with pillow due to his neck pain Assessment / Plan / Recommendation CHL IP CLINICAL IMPRESSIONS 09/03/2019 Clinical Impression Patient presents with moderate pharyngeal dysphagia with sensorimotor deficits.  Decreased oral propulsion with impaired tongue base retraction and mildly decreased laryngeal closure results in mild pharyngeal retention and aspiration of thin via tsp and straw.  Did not test head turn and pt unable to perform chin tuck adequately due to discomfort.  Cued  effortful and dry swallows effective to diminish resdiuals.  Given pt's residuals in phayrnx, do not recommend solids at tihs time.  Full liquid, nectar and tsps thin water  between meals after oral care advised.  Using teach back, SLP educated pt to compensation strategies and recommendations. SLP Visit Diagnosis Dysphagia, oropharyngeal phase (R13.12) Attention and concentration deficit following -- Frontal lobe and executive function deficit following -- Impact on safety and function Moderate aspiration risk   CHL IP TREATMENT RECOMMENDATION 09/03/2019 Treatment Recommendations Therapy as outlined in treatment plan below   Prognosis 09/03/2019 Prognosis for Safe Diet Advancement Good Barriers to Reach Goals -- Barriers/Prognosis Comment -- CHL IP DIET RECOMMENDATION 09/03/2019 SLP Diet Recommendations Thin liquid;Nectar thick liquid Liquid Administration via Straw Medication Administration Whole meds with puree Compensations Slow rate;Small sips/bites Postural Changes Remain semi-upright after after feeds/meals (Comment);Seated upright at 90 degrees   CHL IP OTHER RECOMMENDATIONS 09/03/2019 Recommended Consults -- Oral Care Recommendations Oral care QID Other Recommendations --   CHL IP FOLLOW UP RECOMMENDATIONS 09/03/2019 Follow up Recommendations Skilled Nursing facility   [facility] IP FREQUENCY AND DURATION 09/03/2019 Speech Therapy Frequency (ACUTE ONLY) min 3x week Treatment Duration 2 weeks      CHL IP ORAL PHASE 09/03/2019 Oral Phase WFL Oral - Pudding Teaspoon -- Oral - Pudding Cup -- Oral - Honey Teaspoon -- Oral - Honey Cup -- Oral - Nectar Teaspoon -- Oral - Nectar Cup -- Oral - Nectar Straw WFL Oral - Thin Teaspoon WFL Oral - Thin Cup NT Oral - Thin Straw WFL Oral - Puree WFL Oral - Mech Soft WFL Oral - Regular -- Oral - Multi-Consistency -- Oral - Pill -- Oral Phase - Comment --  CHL IP PHARYNGEAL PHASE 09/03/2019 Pharyngeal Phase Impaired Pharyngeal- Pudding Teaspoon -- Pharyngeal -- Pharyngeal- Pudding Cup -- Pharyngeal -- Pharyngeal- Honey Teaspoon -- Pharyngeal -- Pharyngeal- Honey Cup -- Pharyngeal -- Pharyngeal- Nectar Teaspoon -- Pharyngeal -- Pharyngeal- Nectar Cup --  Pharyngeal -- Pharyngeal- Nectar Straw Pharyngeal residue - valleculae;Pharyngeal residue - pyriform Pharyngeal Material does not enter airway Pharyngeal- Thin Teaspoon Pharyngeal residue - valleculae;Penetration/Aspiration before swallow Pharyngeal Material enters airway, passes BELOW cords and not ejected out despite cough attempt by patient Pharyngeal- Thin Cup NT Pharyngeal -- Pharyngeal- Thin Straw Pharyngeal residue - valleculae;Penetration/Aspiration during swallow;Pharyngeal residue - pyriform Pharyngeal Material enters airway, passes BELOW cords and not ejected out despite cough attempt by patient Pharyngeal- Puree Pharyngeal residue - valleculae;Pharyngeal residue - pyriform Pharyngeal Material does not enter airway Pharyngeal- Mechanical Soft Pharyngeal residue - valleculae;Pharyngeal residue - pyriform Pharyngeal Material does not enter airway Pharyngeal- Regular -- Pharyngeal -- Pharyngeal- Multi-consistency -- Pharyngeal -- Pharyngeal- Pill -- Pharyngeal -- Pharyngeal Comment --  CHL IP CERVICAL ESOPHAGEAL PHASE 09/03/2019 Cervical Esophageal Phase Impaired Pudding Teaspoon -- Pudding Cup -- Honey Teaspoon -- Honey Cup -- Nectar Teaspoon -- Nectar Cup -- Nectar Straw -- Thin Teaspoon -- Thin Cup -- Thin Straw -- Puree -- Mechanical Soft -- Regular -- Multi-consistency -- Pill -- Cervical Esophageal Comment decreased UES openiing Macario Golds 09/04/2019, 8:29 AM  Luanna Salk, MS [facility] SLP Acute Rehab Services Pager [phone] Office [phone]             [place] Ekg Site Rite  Result Date: 09/05/2019 If Site Rite image not attached, placement could not be confirmed due to current cardiac rhythm.     Subjective: No new complaints.   Discharge Exam: Vitals:   09/09/19 1122 09/09/19 1356  BP: 138/86 (!) 145/84  Pulse:  99   Resp: 18   Temp: 98.4 F (36.9 C)   SpO2: 100%    Vitals:   09/09/19 0600 09/09/19 0820 09/09/19 1122 09/09/19 1356  BP:  (!) 143/87 138/86 (!) 145/84   Pulse:  (!) 101 99   Resp: '20 20 18   '$ Temp:  99.2 F (37.3 C) 98.4 F (36.9 C)   TempSrc:  Oral Oral   SpO2: 98% 95% 100%   Weight:        General: lethargic, but no distress.  Cardiovascular: RRR, S1/S2 +,  Respiratory: CTA bilaterally, no wheezing, no rhonchi Abdominal: Soft, NT, ND, bowel sounds + Extremities: no edema, no cyanosis    The results of significant diagnostics from this hospitalization (including imaging, microbiology, ancillary and laboratory) are listed below for reference.     Microbiology: Recent Results (from the past 240 hour(s))  Culture, blood (routine x 2)     Status: None   Collection Time: 09/01/19  9:03 AM   Specimen: BLOOD  Result Value Ref Range Status   Specimen Description BLOOD RIGHT ANTECUBITAL  Final   Special Requests   Final    BOTTLES DRAWN AEROBIC AND ANAEROBIC Blood Culture adequate volume   Culture   Final    NO GROWTH 5 DAYS Performed at [facility], [address], [place], [place] [address]    Report Status 09/06/2019 FINAL  Final  Culture, blood (routine x 2)     Status: None   Collection Time: 09/01/19  9:08 AM   Specimen: BLOOD LEFT HAND  Result Value Ref Range Status   Specimen Description BLOOD LEFT HAND  Final   Special Requests AEROBIC BOTTLE ONLY Blood Culture adequate volume  Final   Culture   Final    NO GROWTH 5 DAYS Performed at [facility], [employer] [address], [place], [place] [address]    Report Status 09/06/2019 FINAL  Final  MRSA PCR Screening     Status: Abnormal   Collection Time: 09/06/19  6:11 AM   Specimen: Nasopharyngeal  Result Value Ref Range Status   MRSA by PCR POSITIVE (A) NEGATIVE Final    Comment:        The GeneXpert MRSA Assay (FDA approved for NASAL specimens only), is one component of a comprehensive MRSA colonization surveillance program. It is not intended to diagnose MRSA infection nor to guide or monitor treatment for MRSA infections. RESULT CALLED TO, READ BACK BY  AND VERIFIED WITH: RN Lyndee Leo THOMPSON [phone] FCP Performed at [facility], [place] [address], [place], [place] [address]   SARS CORONAVIRUS 2 (TAT 6-24 HRS) Nasopharyngeal Nasopharyngeal Swab     Status: None   Collection Time: 09/07/19  2:31 PM   Specimen: Nasopharyngeal Swab  Result Value Ref Range Status   SARS Coronavirus 2 NEGATIVE NEGATIVE Final    Comment: (NOTE) SARS-CoV-2 target nucleic acids are NOT DETECTED. The SARS-CoV-2 RNA is generally detectable in upper and lower respiratory specimens during the acute phase of infection. Negative results do not preclude SARS-CoV-2 infection, do not rule out co-infections with other pathogens, and should not be used as the sole basis for treatment or other patient management decisions. Negative results must be combined with clinical observations, patient history, and epidemiological information. The expected result is Negative. Fact Sheet for Patients: SugarRoll.be Fact Sheet for Healthcare Providers: https://www.woods-mathews.com/ This test is not yet approved or cleared by the [place] FDA and  has been authorized for detection and/or  diagnosis of SARS-CoV-2 by FDA under an Emergency Use Authorization (EUA). This EUA will remain  in effect (meaning this test can be used) for the duration of the COVID-19 declaration under Section 56 4(b)(1) of the Act, 21 U.S.C. section 360bbb-3(b)(1), unless the authorization is terminated or revoked sooner. Performed at [facility], [place] [address] [place], [place] [address]      Labs: BNP (last 3 results) No results for input(s): BNP in the last 8760 hours. Basic Metabolic Panel: Recent Labs  Lab 09/03/19 0340 09/05/19 1218 09/08/19 0656  NA 135 131* 133*  K 3.5 3.7 3.2*  CL 102 94* 96*  CO2 '27 28 30  '$ GLUCOSE 110* 96 89  BUN 8 6* <5*  CREATININE 0.47* 0.54* 0.46*  CALCIUM 7.7* 7.9* 8.1*   Liver Function  Tests: Recent Labs  Lab 09/03/19 0340 09/05/19 1218  AST 61* 36  ALT 49* 33  ALKPHOS 64 69  BILITOT 0.5 1.0  PROT 5.3* 6.1*  ALBUMIN 1.3* 1.3*   No results for input(s): LIPASE, AMYLASE in the last 168 hours. No results for input(s): AMMONIA in the last 168 hours. CBC: Recent Labs  Lab 09/05/19 0316 09/06/19 0315 09/07/19 0624 09/08/19 0656 09/09/19 0641  WBC 11.8* 11.4* 11.1* 10.0 10.6*  NEUTROABS 9.2* 8.7* 8.3* 7.0 7.6  HGB 8.5* 8.6* 8.4* 8.3* 8.0*  HCT 24.6* 25.3* 25.0* 24.3* 24.3*  MCV 92.8 93.4 95.4 93.5 95.7  PLT 565* 630* 620* 672* 670*   Cardiac Enzymes: No results for input(s): CKTOTAL, CKMB, CKMBINDEX, TROPONINI in the last 168 hours. BNP: Invalid input(s): POCBNP CBG: Recent Labs  Lab 09/08/19 1943 09/09/19 0009 09/09/19 0306 09/09/19 0819 09/09/19 1123  GLUCAP 95 88 97 88 124*   D-Dimer No results for input(s): DDIMER in the last 72 hours. Hgb A1c No results for input(s): HGBA1C in the last 72 hours. Lipid Profile No results for input(s): CHOL, HDL, LDLCALC, TRIG, CHOLHDL, LDLDIRECT in the last 72 hours. Thyroid function studies No results for input(s): TSH, T4TOTAL, T3FREE, THYROIDAB in the last 72 hours.  Invalid input(s): FREET3 Anemia work up No results for input(s): VITAMINB12, FOLATE, FERRITIN, TIBC, IRON, RETICCTPCT in the last 72 hours. Urinalysis    Component Value Date/Time   COLORURINE YELLOW 08/28/2019 0235   APPEARANCEUR HAZY (A) 08/28/2019 0235   LABSPEC 1.013 08/28/2019 0235   PHURINE 5.0 08/28/2019 0235   GLUCOSEU NEGATIVE 08/28/2019 0235   HGBUR NEGATIVE 08/28/2019 0235   BILIRUBINUR NEGATIVE 08/28/2019 0235   KETONESUR NEGATIVE 08/28/2019 0235   PROTEINUR NEGATIVE 08/28/2019 0235   NITRITE NEGATIVE 08/28/2019 0235   LEUKOCYTESUR NEGATIVE 08/28/2019 0235   Sepsis Labs Invalid input(s): PROCALCITONIN,  WBC,  LACTICIDVEN Microbiology Recent Results (from the past 240 hour(s))  Culture, blood (routine x 2)     Status:  None   Collection Time: 09/01/19  9:03 AM   Specimen: BLOOD  Result Value Ref Range Status   Specimen Description BLOOD RIGHT ANTECUBITAL  Final   Special Requests   Final    BOTTLES DRAWN AEROBIC AND ANAEROBIC Blood Culture adequate volume   Culture   Final    NO GROWTH 5 DAYS Performed at [facility], [place] [address] [place], [place] [address]    Report Status 09/06/2019 FINAL  Final  Culture, blood (routine x 2)     Status: None   Collection Time: 09/01/19  9:08 AM   Specimen: BLOOD LEFT HAND  Result Value Ref Range Status   Specimen Description  BLOOD LEFT HAND  Final   Special Requests AEROBIC BOTTLE ONLY Blood Culture adequate volume  Final   Culture   Final    NO GROWTH 5 DAYS Performed at [facility], [address] [place], [place] [address]    Report Status 09/06/2019 FINAL  Final  MRSA PCR Screening     Status: Abnormal   Collection Time: 09/06/19  6:11 AM   Specimen: Nasopharyngeal  Result Value Ref Range Status   MRSA by PCR POSITIVE (A) NEGATIVE Final    Comment:        The GeneXpert MRSA Assay (FDA approved for NASAL specimens only), is one component of a comprehensive MRSA colonization surveillance program. It is not intended to diagnose MRSA infection nor to guide or monitor treatment for MRSA infections. RESULT CALLED TO, READ BACK BY AND VERIFIED WITH: RN Lyndee Leo THOMPSON [phone] FCP Performed at [facility], [place] [address] [place], [place] [address]   SARS CORONAVIRUS 2 (TAT 6-24 HRS) Nasopharyngeal Nasopharyngeal Swab     Status: None   Collection Time: 09/07/19  2:31 PM   Specimen: Nasopharyngeal Swab  Result Value Ref Range Status   SARS Coronavirus 2 NEGATIVE NEGATIVE Final    Comment: (NOTE) SARS-CoV-2 target nucleic acids are NOT DETECTED. The SARS-CoV-2 RNA is generally detectable in upper and lower respiratory specimens during the acute phase of infection. Negative results do not preclude SARS-CoV-2 infection, do  not rule out co-infections with other pathogens, and should not be used as the sole basis for treatment or other patient management decisions. Negative results must be combined with clinical observations, patient history, and epidemiological information. The expected result is Negative. Fact Sheet for Patients: SugarRoll.be Fact Sheet for Healthcare Providers: https://www.woods-mathews.com/ This test is not yet approved or cleared by the [place] FDA and  has been authorized for detection and/or diagnosis of SARS-CoV-2 by FDA under an Emergency Use Authorization (EUA). This EUA will remain  in effect (meaning this test can be used) for the duration of the COVID-19 declaration under Section 56 4(b)(1) of the Act, 21 U.S.C. section 360bbb-3(b)(1), unless the authorization is terminated or revoked sooner. Performed at [facility], [place] [address] [place], [place] [address]      Time coordinating discharge: 38  minutes  SIGNED:   Hosie Poisson, MD  [facility] 09/09/2019, 3:43 PM Pager   If 7PM-7AM, please contact night-coverage [email or web address] Password [email or web address]

## 2019-09-09 NOTE — Progress Notes (Signed)
Physical Therapy Treatment Patient Details Name: Ryan Lane MRN: [id number] DOB: [birth year] Today's Date: 09/09/2019    History of Present Illness 68 y.o. M with significant PMH of alcohol and polysubstance abuse who presents to ER with fever, chills, body aches and diarrhea. Admitted with pneumonia/bacteremia with MRSA, transferred to [facility] on 08/28/2019 for further treatment and possible TEE. CT chest/abd/pelvis showing left psoas intramuscular hematoma with evidence of small active bleed. 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 MRI on 09/08/19 revealed a ventral epidural "collection" in his cervical spine.  Neurosurgery plans to treat conservatively at this time with IV antibiotics.      PT Comments    Patient received in bed, working with OT. Appearing very lethargic, left lateral flexed head. Requires max assist +2 to achieve sitting on side of bed. Able to tolerate x 5-10 min with mod assist and max cues to use trunk muscles and left UE to assist and support self. Required max +2 assist to return back to supine and for scooting up in bed. Patient with limited participation and tolerance this visit. He will continue to benefit from skilled PT to improve strength and functional independence.       Follow Up Recommendations  SNF;Supervision/Assistance - 24 hour     Equipment Recommendations  Wheelchair (measurements PT);3in1 (PT)    Recommendations for Other Services       Precautions / Restrictions Precautions Precautions: Fall Restrictions Weight Bearing Restrictions: No    Mobility  Bed Mobility Overal bed mobility: Needs Assistance Bed Mobility: Sit to Supine;Supine to Sit     Supine to sit: +2 for physical assistance;Max assist;HOB elevated Sit to supine: +2 for physical assistance;Max assist;HOB elevated   General bed mobility comments: limited ability to assist despite cues, leaning to left in sitting, cues needed to use left UE to support  himself  Transfers                 General transfer comment: deferred  Ambulation/Gait             General Gait Details: unable   Stairs             Wheelchair Mobility    Modified Rankin (Stroke Patients Only) Modified Rankin (Stroke Patients Only) Pre-Morbid Rankin Score: No symptoms Modified Rankin: Severe disability     Balance Overall balance assessment: Needs assistance Sitting-balance support: Feet supported;Single extremity supported Sitting balance-Leahy Scale: Poor Sitting balance - Comments: requires mod/max assist to maintain sitting at EOB, forward flexed posture and left lateral leaning Postural control: Left lateral lean                                  Cognition Arousal/Alertness: Lethargic Behavior During Therapy: Flat affect Overall Cognitive Status: No family/caregiver present to determine baseline cognitive functioning Area of Impairment: Awareness;Attention;Following commands;Safety/judgement;Problem solving                 Orientation Level: Disoriented to;Situation     Following Commands: Follows one step commands with increased time Safety/Judgement: Decreased awareness of safety;Decreased awareness of deficits Awareness: Emergent Problem Solving: Slow processing;Decreased initiation;Requires verbal cues;Requires tactile cues;Difficulty sequencing General Comments: Pt drowsy but responds to one step commands with stimulation and increased time.      Exercises      General Comments        Pertinent Vitals/Pain Pain Assessment: No/denies pain Pain Location: grimaces with repositioning Pain  Descriptors / Indicators: Grimacing Pain Intervention(s): Monitored during session    Home Living                      Prior Function            PT Goals (current goals can now be found in the care plan section) Acute Rehab PT Goals Patient Stated Goal: none stated PT Goal Formulation: Patient  unable to participate in goal setting Time For Goal Achievement: 09/12/19 Potential to Achieve Goals: Fair Progress towards PT goals: Progressing toward goals    Frequency    Min 3X/week      PT Plan Current plan remains appropriate    Co-evaluation PT/OT/SLP Co-Evaluation/Treatment: Yes Reason for Co-Treatment: For patient/therapist safety;To address functional/ADL transfers;Complexity of the patient's impairments (multi-system involvement) PT goals addressed during session: Mobility/safety with mobility;Balance        AM-PAC PT "6 Clicks" Mobility   Outcome Measure  Help needed turning from your back to your side while in a flat bed without using bedrails?: A Little Help needed moving from lying on your back to sitting on the side of a flat bed without using bedrails?: A Lot Help needed moving to and from a bed to a chair (including a wheelchair)?: A Lot Help needed standing up from a chair using your arms (e.g., wheelchair or bedside chair)?: A Lot Help needed to walk in hospital room?: A Lot Help needed climbing 3-5 steps with a railing? : Total 6 Click Score: 12    End of Session Equipment Utilized During Treatment: Oxygen;Cervical collar Activity Tolerance: Patient limited by fatigue Patient left: in bed;with bed alarm set;with call bell/phone within reach Nurse Communication: Mobility status PT Visit Diagnosis: Muscle weakness (generalized) (M62.81);Other abnormalities of gait and mobility (R26.89);Hemiplegia and hemiparesis Hemiplegia - Right/Left: Right Hemiplegia - caused by: Cerebral infarction     Time: 1140-1156 PT Time Calculation (min) (ACUTE ONLY): 16 min  Charges:  $Therapeutic Activity: 8-22 mins                     Sabriah Hobbins, PT, GCS 09/09/19,12:25 PM

## 2019-09-09 NOTE — TOC Transition Note (Signed)
Transition of Care [facility]) - CM/SW Discharge Note   Patient Details  Name: Ryan Lane MRN: [id number] Date of Birth: 1951/01/23  Transition of Care [facility]) CM/SW Contact:  Kirstie Peri, [place] Work Phone Number: 09/09/2019, 2:19 PM   Clinical Narrative:     Nurse to call report to [phone]. Room number 3242.  Final next level of care: Skilled Nursing Facility Barriers to Discharge: Barriers Resolved   Patient Goals and CMS Choice Patient states their goals for this hospitalization and ongoing recovery are:: Pt son wants rehab for his father CMS Medicare.gov Compare Post Acute Care list provided to:: Patient Represenative (must comment) Choice offered to / list presented to : Adult Children  Discharge Placement              Patient chooses bed at: [facility] Patient to be transferred to facility by: [facility] Name of family member notified: Roselyn Reef Patient and family notified of of transfer: 09/09/19  Discharge Plan and Services In-house Referral: Clinical Social Work Discharge Planning Services: NA Post Acute Care Choice: [place]          DME Arranged: N/A DME Agency: NA       HH Arranged: NA HH Agency: NA        Social Determinants of Health (SDOH) Interventions     Readmission Risk Interventions No flowsheet data found.

## 2019-09-09 NOTE — Progress Notes (Signed)
  Speech Language Pathology Treatment: Dysphagia  Patient Details Name: Ryan Lane MRN: [id number] DOB: [birth year] Today's Date: 09/09/2019 Time: 6387-5643 SLP Time Calculation (min) (ACUTE ONLY): 35 min  Assessment / Plan / Recommendation Clinical Impression  RN notified SLP of change to NPO status, due to pt report of globus sensation last night. RN and SLP repositioned pt to more upright, however, pt was unable to maintain this position and scooted down and to the left in the bed. HOB raised as much as pt would tolerate. Oral care was completed with suction. Pt then accepted trials of nectar thick liquid and applesauce. He did not report globus sensation, and no overt s/s aspiration observed. Pt exhibits intermittent congested productive cough, which he suctions independently. Pt reported the difficulty last night was with "something thick". Will return to full liquid diet, as recommended following MBS 09/03/2019. RN and MD informed. SLP will continue to follow pt for diet tolerance and education.     HPI HPI: Pt is a 68 y.o. M with significant PMH of alcohol and polysubstance abuse who presents to ER with fever, chills, body aches and diarrhea. Admitted with pneumonia/bacteremia with MRSA, transferred to [facility] on 08/28/2019 for further treatment and possible TEE. CT chest/abd/pelvis showing left psoas intramuscular hematoma with evidence of small active bleed. 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       SLP Plan  Continue with current plan of care       Recommendations  Diet recommendations: Nectar-thick liquid(full liquid) Liquids provided via: Straw;Cup Medication Administration: Crushed with puree Supervision: Trained caregiver to feed patient;Full supervision/cueing for compensatory strategies Compensations: Slow rate;Small sips/bites;Effortful swallow;Minimize environmental distractions Postural Changes and/or Swallow Maneuvers: Seated upright 90  degrees;Upright 30-60 min after meal                Oral Care Recommendations: Oral care QID;Oral care prior to ice chip/H20 Follow up Recommendations: Skilled Nursing facility;24 hour supervision/assistance SLP Visit Diagnosis: Dysphagia, oropharyngeal phase (R13.12) Plan: Continue with current plan of care       [place], [facility], [place] Pathologist Office: [phone] Pager: [phone]  Shonna Chock 09/09/2019, 9:44 AM

## 2019-09-09 NOTE — Progress Notes (Signed)
Subjective: Patient reports mild neck pain unchanged. No new changes overnight   Objective: Vital signs in last 24 hours: Temp:  [97.6 F (36.4 C)-99.2 F (37.3 C)] 99.2 F (37.3 C) (11/24 0820) Pulse Rate:  [97-108] 101 (11/24 0820) Resp:  [16-32] 20 (11/24 0820) BP: (122-146)/(64-87) 143/87 (11/24 0820) SpO2:  [92 %-99 %] 95 % (11/24 0820)  Intake/Output from previous day: 11/23 0701 - 11/24 0700 In: 1529.4 [I.V.:779.4; IV Piggyback:750] Out: 1075 [Urine:1075] Intake/Output this shift: No intake/output data recorded.  Neuro: unchanged in upper extremity movement. Antigravity left shoulder but not right. Grips 4+/5, biceps 4/5, triceps 4/5  Lab Results: Lab Results  Component Value Date   WBC 10.6 (H) 09/09/2019   HGB 8.0 (L) 09/09/2019   HCT 24.3 (L) 09/09/2019   MCV 95.7 09/09/2019   PLT 670 (H) 09/09/2019   No results found for: INR, PROTIME BMET Lab Results  Component Value Date   NA 133 (L) 09/08/2019   K 3.2 (L) 09/08/2019   CL 96 (L) 09/08/2019   CO2 30 09/08/2019   GLUCOSE 89 09/08/2019   BUN <5 (L) 09/08/2019   CREATININE 0.46 (L) 09/08/2019   CALCIUM 8.1 (L) 09/08/2019    Studies/Results: Mr Ryan Lane W WUWo Contrast  Result Date: 09/08/2019 CLINICAL DATA:  Encephalopathy.  Fever, bacteremia. EXAM: MRI HEAD WITHOUT AND WITH CONTRAST TECHNIQUE: Multiplanar, multiecho pulse sequences of the brain and surrounding structures were obtained without and with intravenous contrast. CONTRAST:  8mL GADAVIST GADOBUTROL 1 MMOL/ML IV SOLN COMPARISON:  MRI head 08/26/2019 FINDINGS: Brain: The study was done with the assistance of anesthesia. A good quality study was obtained. Mild atrophy. Negative for hydrocephalus. Chronic microvascular ischemic change in the white matter and pons. Small areas of facilitated diffusion in the frontal white matter bilaterally most likely T2 shine through from subacute infarction. These were not present on the prior study. Focal area of  hemorrhage in the left posterior temporal/parietal lobe which shows enhancement postcontrast administration. This area of hemorrhage may be related to infarction or infection but does not show restricted diffusion at this point. This area of hemorrhage has progressed since prior MRI. In addition, microhemorrhage is present in the right temporoparietal lobes. Postcontrast infusion, numerous small enhancing nodules are present in the brain bilaterally. Most these measure approximately 3-5 mm with the exception of the left posterior temporal enhancement which is approximately 5 x 15 mm. Small enhancing nodules in the right cerebellum, and scattered throughout both cerebral hemispheres. No mass-effect or midline shift. These lesions do not demonstrate significant edema. Prior MRI was done without contrast. Vascular: Normal arterial flow voids. Skull and upper cervical spine: Skull and skull base intact. Bone marrow edema in the upper cervical spine at C2 and C3 with large ventral epidural fluid collection. See MRI cervical spine report for further detail. Sinuses/Orbits: Bilateral mastoid effusion. Mucosal edema throughout the paranasal sinuses with air-fluid level left maxillary sinus. Other: None IMPRESSION: 1. Multiple small enhancing nodules in the brain bilaterally. Given history of bacteremia, these are likely septic emboli possibly with infarction but no brain abscess. Hemorrhagic lesion left posterior temporal lobe with mild enhancement likely also an area of septic embolus. 2. Atrophy and chronic microvascular ischemia.  No acute infarct 3. Extensive sinusitis 4. Large ventral epidural fluid collection and cord compression at C2-3. See MRI cervical spine report. Electronically Signed   By: Marlan Palauharles  Clark M.D.   On: 09/08/2019 14:16   [name] Contrast  Result Date: 09/08/2019 CLINICAL  DATA:  Fever delirium back pain. Bacteremia. EXAM: MRI CERVICAL SPINE WITHOUT AND WITH CONTRAST TECHNIQUE:  Multiplanar and multiecho pulse sequences of the cervical spine, to include the craniocervical junction and cervicothoracic junction, were obtained without and with intravenous contrast. CONTRAST:  49mL GADAVIST GADOBUTROL 1 MMOL/ML IV SOLN COMPARISON:  None. FINDINGS: Alignment: The study was performed with assistance of anesthesia. Normal alignment Vertebrae: Bone marrow edema and enhancement C4, C5, and C6. Negative for fracture. Cord: Multilevel spinal stenosis. Cord compression at C2-3 due to large ventral epidural fluid collection compatible with abscess. No cord signal abnormality. Posterior Fossa, vertebral arteries, paraspinal tissues: The patient is intubated with secretions in the pharynx. Disc levels: Large ventral epidural fluid collection with rim enhancement extending from C1 through C3-4. The fluid collection measures approximately 8 x 25 mm in transverse dimension and extends over 5.5 cm. This is causing posterior displacement of the cord with cord compression. Bone marrow edema and disc space edema at C4-5 and C5-6 are most consistent with discitis and osteomyelitis. There is diffuse dural thickening in the cervical spine due to infection. C1-2: Degenerative change. Ventral epidural abscess extends to this level causing posterior displacement of the cord and cord compression C2-3: Ventral epidural abscess with cord compression and moderate to severe spinal stenosis. C3-4: Disc degeneration and spurring. Mild spinal stenosis. Ventral epidural abscess terminates at this level. Neural foramina patent bilaterally C4-5: Bone marrow edema enhancement compatible with discitis/osteomyelitis. Circumferential dural enhancement and moderate spinal stenosis. C5-6: Bone marrow edema and enhancement compatible with discitis osteomyelitis. Prominent ventral epidural enhancement with posterior displacement of the cord and moderate spinal stenosis. Ventral epidural fluid collection measuring approximately 4 x 10 mm  which appears to be arising from the disc space may represent additional epidural abscess behind the C5 vertebral body. C6-7: Disc degeneration and spondylosis. 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 Smaller 4 x 10 mm ventral epidural abscess at C5 with moderate spinal stenosis. Diffuse dural thickening in the cervical spine due to infection. These results were called by telephone at the time of interpretation on 09/08/2019 at 2:26 pm to provider [facility] , who verbally acknowledged these results. Electronically Signed   By: Franchot Gallo M.D.   On: 09/08/2019 14:28   Mr Thoracic Spine W Wo Contrast  Result Date: 09/08/2019 CLINICAL DATA:  Bacteremia.  Back pain EXAM: MRI THORACIC WITHOUT AND WITH CONTRAST TECHNIQUE: Multiplanar and multiecho pulse sequences of the thoracic spine were obtained without and with intravenous contrast. CONTRAST:  33mL GADAVIST GADOBUTROL 1 MMOL/ML IV SOLN COMPARISON:  None. FINDINGS: MRI THORACIC SPINE FINDINGS Alignment:  Normal Vertebrae: Negative for fracture or mass. No evidence of spinal infection in the thoracic spine. Cord:  Normal spinal cord signal.  No cord compression. Paraspinal and other soft tissues: Moderately large loculated left pleural effusion and small right pleural effusion. Multiple lung nodules bilaterally. Prior chest CT also reported these nodules which may represent septic emboli given history. Neoplasm could give a similar appearance. Soft tissue swelling and edema in the posterior soft tissues in the upper back at approximately the C2-3 level. Small fluid collections are present in the muscles at this level which could represent small muscle abscesses. Right-sided fluid collection measures approximately 12 x 24 mm. Left-sided fluid collection  measures approximately 8 x 15 mm. Disc levels: Mild thoracic degenerative  changes. Abnormal disc spaces described below. T5-6: Small central disc protrusion T6-7: Small central disc protrusion. T8-9: Small central disc protrusion T10-11: Small right-sided disc protrusion. IMPRESSION: Negative for spinal infection in the thoracic spine. There are small fluid collections within the posterior muscles at the C2-3 level which could represent small soft tissue abscesses not extending into the spinal canal Mild thoracic disc degeneration. Bilateral pleural effusions and bilateral lung nodules most likely septic emboli given the history and prior imaging studies. Electronically Signed   By: Marlan Palau M.D.   On: 09/08/2019 14:37    Assessment/Plan: No new nsgy recom. Did speak to the son yesterday and stated that he would speak with his nephew about the plan of care. They did not seem like they wanted to pursue any aggressive care.    LOS: 12 days    Ryan Lane Keyaria Lawson 09/09/2019, 8:29 AM

## 2019-09-09 NOTE — Progress Notes (Signed)
[facility]    Date of Admission:  08/28/2019   Total days of antibiotics 13 vanco           ID: Ryan ApleyRichard Pistilli is a 68 y.o. male with disseminated MRSA infection, with bacteremia, cervical epidural abscess, CNS emboli but TEE negative Principal Problem:   MRSA bacteremia Active Problems:   Alcohol withdrawal (HCC)   Acute CVA (cerebrovascular accident) (HCC)   Hyponatremia   Dyspnea and respiratory abnormalities   Palliative care encounter   Encounter for hospice care discussion   Goals of care, counseling/discussion   Palliative care by specialist   Epidural abscess    Subjective: Afebrile, denies back pain  Other:  found to have ventral cervical epidural abscess not thought to be operable without significant risk  Medications:   aspirin  81 mg Oral Daily   baclofen  5 mg Oral BID   Chlorhexidine Gluconate Cloth  6 each Topical Daily   feeding supplement (NEPRO CARB STEADY)  237 mL Oral BID BM   folic acid  1 mg Oral Daily   Or   folic acid  1 mg Intravenous Daily   hydrALAZINE  25 mg Oral Q8H   LORazepam  1 mg Intravenous Once   multivitamin with minerals  1 tablet Oral Daily   polyethylene glycol  17 g Oral Daily   senna-docusate  1 tablet Oral BID   sodium chloride flush  10-40 mL Intracatheter Q12H   thiamine  100 mg Oral Daily   Or   thiamine  100 mg Intravenous Daily    Objective: Vital signs in last 24 hours: Temp:  [97.5 F (36.4 C)-99.2 F (37.3 C)] 97.5 F (36.4 C) (11/24 1544) Pulse Rate:  [97-108] 104 (11/24 1544) Resp:  [18-24] 20 (11/24 1544) BP: (138-145)/(75-87) 143/84 (11/24 1544) SpO2:  [95 %-100 %] 97 % (11/24 1544) Physical Exam  Constitutional: He appears chronically ill and mal-nourished. No distress.  HENT:  Mouth/Throat: Oropharynx is clear and moist. No oropharyngeal exudate.  Cardiovascular: Normal rate, regular rhythm and normal heart sounds. Exam reveals no gallop and no friction  rub.  No murmur heard.  Pulmonary/Chest: Effort normal and breath sounds normal. No respiratory distress. He has no wheezes.  Abdominal: Soft. Bowel sounds are normal. He exhibits no distension. There is no tenderness.  Skin: Skin is warm and dry. No rash noted. No erythema. echymosis   Lab Results Recent Labs    09/08/19 0656 09/09/19 0641  WBC 10.0 10.6*  HGB 8.3* 8.0*  HCT 24.3* 24.3*  NA 133*  --   K 3.2*  --   CL 96*  --   CO2 30  --   BUN <5*  --   CREATININE 0.46*  --    Microbiology: 11/12 blood cx mrsa 11/16 blood cx ngtd  Methicillin resistant staphylococcus aureus    MIC    CIPROFLOXACIN >=8 RESISTANT  Resistant    CLINDAMYCIN <=0.25 SENS... Sensitive    ERYTHROMYCIN >=8 RESISTANT  Resistant    GENTAMICIN <=0.5 SENSI... Sensitive    Inducible Clindamycin NEGATIVE  Sensitive    OXACILLIN >=4 RESISTANT  Resistant    RIFAMPIN <=0.5 SENSI... Sensitive    TETRACYCLINE <=1 SENSITIVE  Sensitive    TRIMETH/SULFA <=10 SENSIT... Sensitive    VANCOMYCIN 1 SENSITIVE  Sensitive     Studies/Results: Mr Laqueta JeanBrain W Wo Contrast  Result Date: 09/08/2019 CLINICAL DATA:  Encephalopathy.  Fever, bacteremia. EXAM: MRI HEAD WITHOUT AND WITH CONTRAST TECHNIQUE:  Multiplanar, multiecho pulse sequences of the brain and surrounding structures were obtained without and with intravenous contrast. CONTRAST:  52mL GADAVIST GADOBUTROL 1 MMOL/ML IV SOLN COMPARISON:  MRI head 08/26/2019 FINDINGS: Brain: The study was done with the assistance of anesthesia. A good quality study was obtained. Mild atrophy. Negative for hydrocephalus. Chronic microvascular ischemic change in the white matter and pons. Small areas of facilitated diffusion in the frontal white matter bilaterally most likely T2 shine through from subacute infarction. These were not present on the prior study. Focal area of hemorrhage in the left posterior temporal/parietal lobe which shows enhancement postcontrast administration. This area  of hemorrhage may be related to infarction or infection but does not show restricted diffusion at this point. This area of hemorrhage has progressed since prior MRI. In addition, microhemorrhage is present in the right temporoparietal lobes. Postcontrast infusion, numerous small enhancing nodules are present in the brain bilaterally. Most these measure approximately 3-5 mm with the exception of the left posterior temporal enhancement which is approximately 5 x 15 mm. Small enhancing nodules in the right cerebellum, and scattered throughout both cerebral hemispheres. No mass-effect or midline shift. These lesions do not demonstrate significant edema. Prior MRI was done without contrast. Vascular: Normal arterial flow voids. Skull and upper cervical spine: Skull and skull base intact. Bone marrow edema in the upper cervical spine at C2 and C3 with large ventral epidural fluid collection. See MRI cervical spine report for further detail. Sinuses/Orbits: Bilateral mastoid effusion. Mucosal edema throughout the paranasal sinuses with air-fluid level left maxillary sinus. Other: None IMPRESSION: 1. Multiple small enhancing nodules in the brain bilaterally. Given history of bacteremia, these are likely septic emboli possibly with infarction but no brain abscess. Hemorrhagic lesion left posterior temporal lobe with mild enhancement likely also an area of septic embolus. 2. Atrophy and chronic microvascular ischemia.  No acute infarct 3. Extensive sinusitis 4. Large ventral epidural fluid collection and cord compression at C2-3. See MRI cervical spine report. Electronically Signed   By: Franchot Gallo M.D.   On: 09/08/2019 14:16   [name] Contrast  Result Date: 09/08/2019 CLINICAL DATA:  Fever delirium back pain. Bacteremia. EXAM: MRI CERVICAL SPINE WITHOUT AND WITH CONTRAST TECHNIQUE: Multiplanar and multiecho pulse sequences of the cervical spine, to include the craniocervical junction and  cervicothoracic junction, were obtained without and with intravenous contrast. CONTRAST:  41mL GADAVIST GADOBUTROL 1 MMOL/ML IV SOLN COMPARISON:  None. FINDINGS: Alignment: The study was performed with assistance of anesthesia. Normal alignment Vertebrae: Bone marrow edema and enhancement C4, C5, and C6. Negative for fracture. Cord: Multilevel spinal stenosis. Cord compression at C2-3 due to large ventral epidural fluid collection compatible with abscess. No cord signal abnormality. Posterior Fossa, vertebral arteries, paraspinal tissues: The patient is intubated with secretions in the pharynx. Disc levels: Large ventral epidural fluid collection with rim enhancement extending from C1 through C3-4. The fluid collection measures approximately 8 x 25 mm in transverse dimension and extends over 5.5 cm. This is causing posterior displacement of the cord with cord compression. Bone marrow edema and disc space edema at C4-5 and C5-6 are most consistent with discitis and osteomyelitis. There is diffuse dural thickening in the cervical spine due to infection. C1-2: Degenerative change. Ventral epidural abscess extends to this level causing posterior displacement of the cord and cord compression C2-3: Ventral epidural abscess with cord compression and moderate to severe spinal stenosis. C3-4: Disc degeneration and spurring. Mild spinal stenosis. Ventral epidural abscess terminates at  this level. Neural foramina patent bilaterally C4-5: Bone marrow edema enhancement compatible with discitis/osteomyelitis. Circumferential dural enhancement and moderate spinal stenosis. C5-6: Bone marrow edema and enhancement compatible with discitis osteomyelitis. Prominent ventral epidural enhancement with posterior displacement of the cord and moderate spinal stenosis. Ventral epidural fluid collection measuring approximately 4 x 10 mm which appears to be arising from the disc space may represent additional epidural abscess behind the C5  vertebral body. C6-7: Disc degeneration and spondylosis. 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 Smaller 4 x 10 mm ventral epidural abscess at C5 with moderate spinal stenosis. Diffuse dural thickening in the cervical spine due to infection. These results were called by telephone at the time of interpretation on 09/08/2019 at 2:26 pm to provider [facility] , who verbally acknowledged these results. Electronically Signed   By: Marlan Palau M.D.   On: 09/08/2019 14:28   Mr Thoracic Spine W Wo Contrast  Result Date: 09/08/2019 CLINICAL DATA:  Bacteremia.  Back pain EXAM: MRI THORACIC WITHOUT AND WITH CONTRAST TECHNIQUE: Multiplanar and multiecho pulse sequences of the thoracic spine were obtained without and with intravenous contrast. CONTRAST:  23mL GADAVIST GADOBUTROL 1 MMOL/ML IV SOLN COMPARISON:  None. FINDINGS: MRI THORACIC SPINE FINDINGS Alignment:  Normal Vertebrae: Negative for fracture or mass. No evidence of spinal infection in the thoracic spine. Cord:  Normal spinal cord signal.  No cord compression. Paraspinal and other soft tissues: Moderately large loculated left pleural effusion and small right pleural effusion. Multiple lung nodules bilaterally. Prior chest CT also reported these nodules which may represent septic emboli given history. Neoplasm could give a similar appearance. Soft tissue swelling and edema in the posterior soft tissues in the upper back at approximately the C2-3 level. Small fluid collections are present in the muscles at this level which could represent small muscle abscesses. Right-sided fluid collection measures approximately 12 x 24 mm. Left-sided fluid collection measures approximately 8 x 15 mm. Disc levels: Mild thoracic degenerative changes. Abnormal disc spaces  described below. T5-6: Small central disc protrusion T6-7: Small central disc protrusion. T8-9: Small central disc protrusion T10-11: Small right-sided disc protrusion. IMPRESSION: Negative for spinal infection in the thoracic spine. There are small fluid collections within the posterior muscles at the C2-3 level which could represent small soft tissue abscesses not extending into the spinal canal Mild thoracic disc degeneration. Bilateral pleural effusions and bilateral lung nodules most likely septic emboli given the history and prior imaging studies. Electronically Signed   By: Marlan Palau M.D.   On: 09/08/2019 14:37     Assessment/Plan: MRSA disseminated infection, with bacteremia, cervical epidural abscess/osteomyelitis and CNS septic emboli = continue on vancomycin, plan for 8 weeks Will need to continue with twice a week cr check and weekly vancomycin level  Infection is significant since he has some cervical cord compression to C1-C3 that is unable to be debulked.  Overall prognosis poor. Agree with goals of care discussion  [facility] Cell: [phone] Pager: [phone]  09/09/2019, 6:11 PM

## 2019-09-09 NOTE — Progress Notes (Signed)
Occupational Therapy Treatment Patient Details Name: Ryan Lane MRN: [id number] DOB: [birth year] Today's Date: 09/09/2019    History of present illness 68 y.o. M with significant PMH of alcohol and polysubstance abuse who presents to ER with fever, chills, body aches and diarrhea. Admitted with pneumonia/bacteremia with MRSA, transferred to [facility] on 08/28/2019 for further treatment and possible TEE. CT chest/abd/pelvis showing left psoas intramuscular hematoma with evidence of small active bleed. 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 MRI on 09/08/19 revealed a ventral epidural "collection" in his cervical spine.  Neurosurgery plans to treat conservatively at this time with IV antibiotics.     OT comments  Pt received in bed, agreeable to OT/PT session. Notable edema in RUE, educated pt on importance of elevation and mobility of RUE. Pt with decreased functional use of RUE, unable to use suction. Pt engaged in gentle AAROM of elbow, wrist, and digits and PROM of shoulder. RN approved pt mobility to EOB. Pt required maxA+2 for bed mobility, he tolerated sitting EOB 5-10 min with modA and max cues to sit upright. Pt will continue to benefit from skilled OT services to maximize safety and independence with ADL/IADL and functional mobility. Will continue to follow acutely and progress as tolerated.    Follow Up Recommendations  SNF    Equipment Recommendations  Other (comment)(defer to next venue)    Recommendations for Other Services      Precautions / Restrictions Precautions Precautions: Fall Restrictions Weight Bearing Restrictions: No       Mobility Bed Mobility Overal bed mobility: Needs Assistance Bed Mobility: Sit to Supine;Supine to Sit     Supine to sit: +2 for physical assistance;Max assist;HOB elevated Sit to supine: +2 for physical assistance;Max assist;HOB elevated   General bed mobility comments: limited ability to assist despite cues, leaning  to left in sitting, cues needed to use left UE to support himself  Transfers Overall transfer level: Needs assistance               General transfer comment: deferred    Balance Overall balance assessment: Needs assistance Sitting-balance support: Feet supported;Single extremity supported Sitting balance-Leahy Scale: Poor Sitting balance - Comments: requires mod/max assist to maintain sitting at EOB, forward flexed posture and left lateral leaning Postural control: Left lateral lean                                 ADL either performed or assessed with clinical judgement   ADL Overall ADL's : Needs assistance/impaired     Grooming: Maximal assistance Grooming Details (indicate cue type and reason): requires assistance for bimanual tasks, decreased functional use of RUE Upper Body Bathing: Total assistance               Toilet Transfer: +2 for physical assistance;Maximal assistance Toilet Transfer Details (indicate cue type and reason): simulated rolling in bed Toileting- Clothing Manipulation and Hygiene: Total assistance         General ADL Comments: pt able to tolerate sitting EOB ~ 5 min before initiating return to supine     Vision       Perception     Praxis      Cognition Arousal/Alertness: Lethargic Behavior During Therapy: Flat affect Overall Cognitive Status: No family/caregiver present to determine baseline cognitive functioning Area of Impairment: Awareness;Attention;Following commands;Safety/judgement;Problem solving                 Orientation  Level: Disoriented to;Situation Current Attention Level: Sustained   Following Commands: Follows one step commands with increased time Safety/Judgement: Decreased awareness of safety;Decreased awareness of deficits Awareness: Emergent Problem Solving: Slow processing;Decreased initiation;Requires verbal cues;Requires tactile cues;Difficulty sequencing General Comments: Pt drowsy  but responds to one step commands with stimulation and increased time.        Exercises     Shoulder Instructions       General Comments per RN pt okay to sit EOB    Pertinent Vitals/ Pain       Pain Assessment: Faces Faces Pain Scale: Hurts little more Pain Location: grimaces with repositioning Pain Descriptors / Indicators: Grimacing Pain Intervention(s): Limited activity within patient's tolerance;Monitored during session  Home Living                                          Prior Functioning/Environment              Frequency  Min 2X/week        Progress Toward Goals  OT Goals(current goals can now be found in the care plan section)  Progress towards OT goals: Progressing toward goals  Acute Rehab OT Goals Patient Stated Goal: none stated OT Goal Formulation: With patient Time For Goal Achievement: 09/23/19 Potential to Achieve Goals: Fair ADL Goals Pt Will Perform Grooming: with supervision;sitting;standing Pt Will Perform Upper Body Bathing: with supervision;sitting Pt Will Perform Lower Body Bathing: with supervision;sitting/lateral leans;sit to/from stand Pt Will Perform Upper Body Dressing: with supervision;sitting Pt Will Perform Lower Body Dressing: with supervision;sitting/lateral leans;sit to/from stand Pt Will Transfer to Toilet: with min guard assist;ambulating Pt Will Perform Toileting - Clothing Manipulation and hygiene: with supervision;sitting/lateral leans;sit to/from stand  Plan Discharge plan remains appropriate    Co-evaluation    PT/OT/SLP Co-Evaluation/Treatment: Yes Reason for Co-Treatment: Complexity of the patient's impairments (multi-system involvement);For patient/therapist safety;To address functional/ADL transfers PT goals addressed during session: Mobility/safety with mobility;Balance OT goals addressed during session: ADL's and self-care      AM-PAC OT "6 Clicks" Daily Activity     Outcome  Measure   Help from another person eating meals?: A Lot Help from another person taking care of personal grooming?: A Lot Help from another person toileting, which includes using toliet, bedpan, or urinal?: A Lot Help from another person bathing (including washing, rinsing, drying)?: A Lot Help from another person to put on and taking off regular upper body clothing?: A Lot Help from another person to put on and taking off regular lower body clothing?: Total 6 Click Score: 11    End of Session Equipment Utilized During Treatment: Oxygen  OT Visit Diagnosis: Unsteadiness on feet (R26.81);Muscle weakness (generalized) (M62.81);Other symptoms and signs involving the nervous system (R29.898);Other symptoms and signs involving cognitive function;Pain Pain - part of body: (neck)   Activity Tolerance Patient tolerated treatment well;Patient limited by fatigue   Patient Left in bed;with call bell/phone within reach;with bed alarm set   Nurse Communication Mobility status        Time: 1130-1156 OT Time Calculation (min): 26 min  Charges: OT General Charges $OT Visit: 1 Visit OT Treatments $Self Care/Home Management : 8-22 mins  Dorinda Hill OTR/L Acute Rehabilitation Services Office: [place] 09/09/2019, 2:20 PM

## 2019-09-18 ENCOUNTER — Inpatient Hospital Stay: Payer: Medicare Other | Admitting: Family

## 2019-09-19 ENCOUNTER — Telehealth: Payer: Self-pay

## 2019-09-19 ENCOUNTER — Inpatient Hospital Stay: Payer: Medicare Other | Admitting: Family

## 2019-09-19 ENCOUNTER — Other Ambulatory Visit: Payer: Self-pay

## 2019-09-19 NOTE — Telephone Encounter (Signed)
Attempted x2 to reach out to RN taking care of patient to conduct scheduled e-visit. [facility] practitioner took phone number, name and purpose of call and stated the RN would call us back however yet to receive call back. Appointment was scheduled for 10:30-11. Marya Amsler, NP notified.   Jahlil Ziller Lorita Officer, RN

## 2019-10-15 ENCOUNTER — Encounter (INDEPENDENT_AMBULATORY_CARE_PROVIDER_SITE_OTHER): Payer: Medicare Other | Admitting: Family

## 2019-10-15 ENCOUNTER — Other Ambulatory Visit: Payer: Self-pay

## 2019-10-15 NOTE — Progress Notes (Signed)
Error

## 2019-10-16 ENCOUNTER — Telehealth: Payer: Self-pay

## 2019-10-16 ENCOUNTER — Other Ambulatory Visit: Payer: Self-pay

## 2019-10-16 ENCOUNTER — Encounter: Payer: Medicare Other | Admitting: Family

## 2019-10-16 NOTE — Progress Notes (Signed)
Unable to reach Ryan Lane via phone.

## 2019-10-16 NOTE — Telephone Encounter (Signed)
Called during scheduled Evisit appointment time. Called both numbers that were provided [phone] & [phone]) and left VM on [phone] line. Asked to speak to Kennyth Lose but was unable to reach her.   Lakeasha Petion Lorita Officer, RN

## 2019-11-17 DEATH — deceased

## 2023-12-15 DEATH — deceased
